# Patient Record
Sex: Female | Born: 1945
Health system: Southern US, Community
[De-identification: ages and names within clinical notes are randomized; demographics above are authoritative.]

## PROBLEM LIST (undated history)

## (undated) DIAGNOSIS — Z9889 Other specified postprocedural states: Secondary | ICD-10-CM

## (undated) DIAGNOSIS — R51 Headache: Secondary | ICD-10-CM

## (undated) DIAGNOSIS — K589 Irritable bowel syndrome without diarrhea: Secondary | ICD-10-CM

## (undated) DIAGNOSIS — F419 Anxiety disorder, unspecified: Secondary | ICD-10-CM

## (undated) DIAGNOSIS — C439 Malignant melanoma of skin, unspecified: Secondary | ICD-10-CM

## (undated) DIAGNOSIS — I2510 Atherosclerotic heart disease of native coronary artery without angina pectoris: Secondary | ICD-10-CM

## (undated) DIAGNOSIS — M8580 Other specified disorders of bone density and structure, unspecified site: Secondary | ICD-10-CM

## (undated) DIAGNOSIS — K219 Gastro-esophageal reflux disease without esophagitis: Secondary | ICD-10-CM

## (undated) DIAGNOSIS — J189 Pneumonia, unspecified organism: Secondary | ICD-10-CM

## (undated) DIAGNOSIS — R7303 Prediabetes: Secondary | ICD-10-CM

## (undated) DIAGNOSIS — Z8719 Personal history of other diseases of the digestive system: Secondary | ICD-10-CM

## (undated) DIAGNOSIS — C801 Malignant (primary) neoplasm, unspecified: Secondary | ICD-10-CM

## (undated) DIAGNOSIS — F329 Major depressive disorder, single episode, unspecified: Secondary | ICD-10-CM

## (undated) DIAGNOSIS — G4730 Sleep apnea, unspecified: Secondary | ICD-10-CM

## (undated) DIAGNOSIS — T8859XA Other complications of anesthesia, initial encounter: Secondary | ICD-10-CM

## (undated) DIAGNOSIS — R112 Nausea with vomiting, unspecified: Secondary | ICD-10-CM

## (undated) DIAGNOSIS — F32A Depression, unspecified: Secondary | ICD-10-CM

## (undated) DIAGNOSIS — M797 Fibromyalgia: Secondary | ICD-10-CM

## (undated) DIAGNOSIS — I10 Essential (primary) hypertension: Secondary | ICD-10-CM

## (undated) DIAGNOSIS — T4145XA Adverse effect of unspecified anesthetic, initial encounter: Secondary | ICD-10-CM

## (undated) DIAGNOSIS — M1990 Unspecified osteoarthritis, unspecified site: Secondary | ICD-10-CM

## (undated) DIAGNOSIS — R350 Frequency of micturition: Secondary | ICD-10-CM

## (undated) HISTORY — PX: HERNIA REPAIR: SHX51

## (undated) HISTORY — PX: ARCUATE KERATECTOMY: SHX212

## (undated) HISTORY — PX: APPENDECTOMY: SHX54

---

## 1968-02-28 HISTORY — PX: OTHER SURGICAL HISTORY: SHX169

## 1981-02-27 HISTORY — PX: CERVICAL FUSION: SHX112

## 1989-02-27 HISTORY — PX: BREAST LUMPECTOMY: SHX2

## 1999-02-28 HISTORY — PX: OTHER SURGICAL HISTORY: SHX169

## 1999-05-17 ENCOUNTER — Encounter: Admission: RE | Admit: 1999-05-17 | Discharge: 1999-05-17 | Payer: Self-pay | Admitting: Family Medicine

## 1999-05-17 ENCOUNTER — Encounter: Payer: Self-pay | Admitting: Family Medicine

## 1999-06-16 ENCOUNTER — Other Ambulatory Visit: Admission: RE | Admit: 1999-06-16 | Discharge: 1999-06-16 | Payer: Self-pay | Admitting: Family Medicine

## 2000-06-25 ENCOUNTER — Other Ambulatory Visit: Admission: RE | Admit: 2000-06-25 | Discharge: 2000-06-25 | Payer: Self-pay | Admitting: Family Medicine

## 2001-05-23 ENCOUNTER — Encounter: Payer: Self-pay | Admitting: *Deleted

## 2001-05-23 ENCOUNTER — Encounter: Admission: RE | Admit: 2001-05-23 | Discharge: 2001-05-23 | Payer: Self-pay | Admitting: *Deleted

## 2001-06-28 ENCOUNTER — Emergency Department (HOSPITAL_COMMUNITY): Admission: EM | Admit: 2001-06-28 | Discharge: 2001-06-28 | Payer: Self-pay | Admitting: Emergency Medicine

## 2001-06-28 ENCOUNTER — Encounter: Payer: Self-pay | Admitting: Emergency Medicine

## 2001-07-03 ENCOUNTER — Other Ambulatory Visit: Admission: RE | Admit: 2001-07-03 | Discharge: 2001-07-03 | Payer: Self-pay | Admitting: *Deleted

## 2002-05-28 ENCOUNTER — Encounter: Payer: Self-pay | Admitting: Family Medicine

## 2002-05-28 ENCOUNTER — Encounter: Admission: RE | Admit: 2002-05-28 | Discharge: 2002-05-28 | Payer: Self-pay | Admitting: Family Medicine

## 2002-11-20 ENCOUNTER — Encounter: Admission: RE | Admit: 2002-11-20 | Discharge: 2002-11-20 | Payer: Self-pay

## 2003-02-28 HISTORY — PX: OTHER SURGICAL HISTORY: SHX169

## 2003-07-03 ENCOUNTER — Encounter: Admission: RE | Admit: 2003-07-03 | Discharge: 2003-07-03 | Payer: Self-pay | Admitting: Family Medicine

## 2003-09-16 ENCOUNTER — Other Ambulatory Visit: Admission: RE | Admit: 2003-09-16 | Discharge: 2003-09-16 | Payer: Self-pay | Admitting: Family Medicine

## 2003-10-20 ENCOUNTER — Encounter: Admission: RE | Admit: 2003-10-20 | Discharge: 2003-10-20 | Payer: Self-pay | Admitting: Family Medicine

## 2003-11-04 ENCOUNTER — Emergency Department (HOSPITAL_COMMUNITY): Admission: EM | Admit: 2003-11-04 | Discharge: 2003-11-04 | Payer: Self-pay | Admitting: Emergency Medicine

## 2003-11-09 ENCOUNTER — Ambulatory Visit (HOSPITAL_COMMUNITY): Admission: RE | Admit: 2003-11-09 | Discharge: 2003-11-11 | Payer: Self-pay | Admitting: Orthopedic Surgery

## 2004-03-24 ENCOUNTER — Ambulatory Visit: Payer: Self-pay | Admitting: Internal Medicine

## 2004-03-24 ENCOUNTER — Ambulatory Visit (HOSPITAL_BASED_OUTPATIENT_CLINIC_OR_DEPARTMENT_OTHER): Admission: RE | Admit: 2004-03-24 | Discharge: 2004-03-24 | Payer: Self-pay | Admitting: Family Medicine

## 2004-09-20 ENCOUNTER — Encounter: Admission: RE | Admit: 2004-09-20 | Discharge: 2004-09-20 | Payer: Self-pay | Admitting: Family Medicine

## 2004-12-12 ENCOUNTER — Encounter: Admission: RE | Admit: 2004-12-12 | Discharge: 2004-12-12 | Payer: Self-pay | Admitting: Orthopedic Surgery

## 2004-12-16 ENCOUNTER — Encounter: Admission: RE | Admit: 2004-12-16 | Discharge: 2004-12-16 | Payer: Self-pay | Admitting: Orthopedic Surgery

## 2005-10-25 ENCOUNTER — Encounter: Admission: RE | Admit: 2005-10-25 | Discharge: 2005-10-25 | Payer: Self-pay | Admitting: Family Medicine

## 2005-11-02 ENCOUNTER — Encounter: Admission: RE | Admit: 2005-11-02 | Discharge: 2005-11-02 | Payer: Self-pay | Admitting: Family Medicine

## 2005-12-29 ENCOUNTER — Other Ambulatory Visit: Admission: RE | Admit: 2005-12-29 | Discharge: 2005-12-29 | Payer: Self-pay | Admitting: Family Medicine

## 2007-05-15 ENCOUNTER — Encounter: Admission: RE | Admit: 2007-05-15 | Discharge: 2007-05-15 | Payer: Self-pay | Admitting: Family Medicine

## 2008-02-28 HISTORY — PX: OTHER SURGICAL HISTORY: SHX169

## 2008-04-17 ENCOUNTER — Other Ambulatory Visit: Admission: RE | Admit: 2008-04-17 | Discharge: 2008-04-17 | Payer: Self-pay | Admitting: Family Medicine

## 2008-07-03 ENCOUNTER — Encounter: Admission: RE | Admit: 2008-07-03 | Discharge: 2008-07-03 | Payer: Self-pay | Admitting: Family Medicine

## 2008-07-06 ENCOUNTER — Encounter: Admission: RE | Admit: 2008-07-06 | Discharge: 2008-07-06 | Payer: Self-pay | Admitting: Gastroenterology

## 2008-10-20 ENCOUNTER — Inpatient Hospital Stay (HOSPITAL_COMMUNITY): Admission: RE | Admit: 2008-10-20 | Discharge: 2008-10-22 | Payer: Self-pay | Admitting: Surgery

## 2009-01-11 ENCOUNTER — Other Ambulatory Visit: Admission: RE | Admit: 2009-01-11 | Discharge: 2009-01-11 | Payer: Self-pay | Admitting: Family Medicine

## 2009-01-13 ENCOUNTER — Ambulatory Visit (HOSPITAL_COMMUNITY): Admission: RE | Admit: 2009-01-13 | Discharge: 2009-01-13 | Payer: Self-pay | Admitting: Family Medicine

## 2009-08-09 ENCOUNTER — Other Ambulatory Visit: Admission: RE | Admit: 2009-08-09 | Discharge: 2009-08-09 | Payer: Self-pay | Admitting: Family Medicine

## 2009-11-09 ENCOUNTER — Encounter: Admission: RE | Admit: 2009-11-09 | Discharge: 2009-11-09 | Payer: Self-pay | Admitting: Orthopaedic Surgery

## 2010-01-31 ENCOUNTER — Other Ambulatory Visit
Admission: RE | Admit: 2010-01-31 | Discharge: 2010-01-31 | Payer: Self-pay | Source: Home / Self Care | Admitting: Family Medicine

## 2010-04-13 ENCOUNTER — Other Ambulatory Visit: Payer: Self-pay | Admitting: Family Medicine

## 2010-04-14 ENCOUNTER — Other Ambulatory Visit: Payer: Self-pay | Admitting: Family Medicine

## 2010-04-14 ENCOUNTER — Other Ambulatory Visit: Payer: Self-pay | Admitting: Dermatology

## 2010-04-14 DIAGNOSIS — N63 Unspecified lump in unspecified breast: Secondary | ICD-10-CM

## 2010-04-14 DIAGNOSIS — N644 Mastodynia: Secondary | ICD-10-CM

## 2010-04-21 ENCOUNTER — Ambulatory Visit
Admission: RE | Admit: 2010-04-21 | Discharge: 2010-04-21 | Disposition: A | Discharge status: Home / Self Care | Payer: Medicare HMO | Source: Ambulatory Visit | Attending: Family Medicine | Admitting: Family Medicine

## 2010-04-21 DIAGNOSIS — N63 Unspecified lump in unspecified breast: Secondary | ICD-10-CM

## 2010-04-21 DIAGNOSIS — N644 Mastodynia: Secondary | ICD-10-CM

## 2010-06-04 LAB — CBC
HCT: 36.1 % (ref 36.0–46.0)
HCT: 36.5 % (ref 36.0–46.0)
Hemoglobin: 12.2 g/dL (ref 12.0–15.0)
Hemoglobin: 12.4 g/dL (ref 12.0–15.0)
MCHC: 33.7 g/dL (ref 30.0–36.0)
MCHC: 34 g/dL (ref 30.0–36.0)
MCV: 85.6 fL (ref 78.0–100.0)
MCV: 85.6 fL (ref 78.0–100.0)
Platelets: 214 10*3/uL (ref 150–400)
Platelets: 219 10*3/uL (ref 150–400)
RBC: 4.22 MIL/uL (ref 3.87–5.11)
RBC: 4.27 MIL/uL (ref 3.87–5.11)
RDW: 13 % (ref 11.5–15.5)
RDW: 13 % (ref 11.5–15.5)
WBC: 13.1 10*3/uL — ABNORMAL HIGH (ref 4.0–10.5)
WBC: 18.7 10*3/uL — ABNORMAL HIGH (ref 4.0–10.5)

## 2010-06-04 LAB — DIFFERENTIAL
Basophils Absolute: 0 10*3/uL (ref 0.0–0.1)
Basophils Absolute: 0.1 10*3/uL (ref 0.0–0.1)
Basophils Relative: 0 % (ref 0–1)
Basophils Relative: 1 % (ref 0–1)
Eosinophils Absolute: 0 10*3/uL (ref 0.0–0.7)
Eosinophils Absolute: 0 10*3/uL (ref 0.0–0.7)
Eosinophils Relative: 0 % (ref 0–5)
Eosinophils Relative: 0 % (ref 0–5)
Lymphocytes Relative: 15 % (ref 12–46)
Lymphocytes Relative: 25 % (ref 12–46)
Lymphs Abs: 2.8 10*3/uL (ref 0.7–4.0)
Lymphs Abs: 3.3 10*3/uL (ref 0.7–4.0)
Monocytes Absolute: 0.9 10*3/uL (ref 0.1–1.0)
Monocytes Absolute: 1.2 10*3/uL — ABNORMAL HIGH (ref 0.1–1.0)
Monocytes Relative: 6 % (ref 3–12)
Monocytes Relative: 7 % (ref 3–12)
Neutro Abs: 14.7 10*3/uL — ABNORMAL HIGH (ref 1.7–7.7)
Neutro Abs: 8.8 10*3/uL — ABNORMAL HIGH (ref 1.7–7.7)
Neutrophils Relative %: 67 % (ref 43–77)
Neutrophils Relative %: 79 % — ABNORMAL HIGH (ref 43–77)

## 2010-06-04 LAB — HEMOGLOBIN AND HEMATOCRIT, BLOOD
HCT: 39.6 % (ref 36.0–46.0)
Hemoglobin: 13.4 g/dL (ref 12.0–15.0)

## 2010-06-04 LAB — BASIC METABOLIC PANEL
BUN: 11 mg/dL (ref 6–23)
CO2: 30 mEq/L (ref 19–32)
Calcium: 9.3 mg/dL (ref 8.4–10.5)
Chloride: 106 mEq/L (ref 96–112)
Creatinine, Ser: 0.9 mg/dL (ref 0.4–1.2)
GFR calc Af Amer: >60 mL/min (ref >60)
GFR calc non Af Amer: >60 mL/min (ref >60)
Glucose, Bld: 107 mg/dL — ABNORMAL HIGH (ref 70–99)
Potassium: 4.4 mEq/L (ref 3.5–5.1)
Sodium: 140 mEq/L (ref 135–145)

## 2010-07-12 NOTE — Op Note (Signed)
Bonnie Juarez                  ACCOUNT NO.:  [id number]   MEDICAL RECORD NO.:  [id number]          PATIENT TYPE:  INP   LOCATION:  0003                         FACILITY:  [facility]   PHYSICIAN:  Thornton Park. Daphine Deutscher, MD  DATE OF BIRTH:  12/06/1945   DATE OF PROCEDURE:  10/20/2008  DATE OF DISCHARGE:                               OPERATIVE REPORT   PREOPERATIVE DIAGNOSIS:  Gastroesophageal reflux disease with a  prominent hiatal hernia on upper GI and symptomatically consistent with  reflux, improved with omeprazole   POSTOPERATIVE DIAGNOSIS:  Gastroesophageal reflux disease with a  prominent hiatal hernia on upper GI and symptomatically consistent with  reflux, improved with omeprazole.   PROCEDURES:  Laparoscopic repair of hiatal hernia (three suture  pledgeted closure posteriorly) and laparoscopic Nissen fundoplication  (performed over #56 lighted bougie with three suture wrap secured with  tie knots)   SURGEON:  Thornton Park. Daphine Deutscher, MD   ASSISTANT:  Adolph Pollack, M.D.   ANESTHESIA:  General endotracheal.   DESCRIPTION OF PROCEDURE:  Bonnie Juarez is a 65 year old lady taken to  room #11 on Tuesday, October 20, 2008.  The abdomen was prepped with  Techni-Care equivalent and draped sterilely.  Access to the abdomen was  gained through the left upper quadrant using a 0 degrees 10/11 without  difficulty and the abdomen was insufflated.  Standard trocar placements  were used for a Nissen, ultimately with another #5 on the left and two  #11s on the right, #11 to the left of the umbilicus and #5 in the upper  midline for the [facility] retractor.  With the [facility] in place, she  had a broad-based hiatal hernia with obvious mechanical component.  I  went ahead and used harmonic scalpel and took down the phrenoesophageal  reflection as well as the gastrohepatic window and dissected the right  crus up, and then carried it anteriorly across to the left side.   Next, I took down the  short gastrics with a harmonic scalpel and then  mobilized the hiatal hernia from the left side.  Eventually, I put a  Penrose drain beneath it, around it, and used traction and then  completely dissected the distal esophagus, getting plenty of length into  the abdomen.  The crura were also dissected completely so I could get a  good closure.   Repair was then affected with three pledgeted sutures posteriorly tied  with extracorporeal knots.  This made this nice and snug, but a 56  dilator easily went down through.  With the 56 in place, I brought the  cardia around, invaginated it around the distal esophagus and secured it  with three sutures and tie knots.  The third suture I went ahead and got  a purchase of the stomach, and then on the second portion of the stomach  that was being wrapped, before getting a bit of the esophagus, and the  wrapped stomach.  These were all held in place.  A good wrap was  present.  There was good length and there was no bleeding.  The bougie  was removed.  Everything looked good and the patient seemed to tolerate  it well.   The ports were all injected with 0.5% Marcaine with epinephrine and they  were closed with 4-0 Vicryl after the abdomen was deflated.  Vicryl 4-0  in the subcu and then Benzoin and Steri-Strips.  The patient was taken  to recovery room in satisfactory condition.      Thornton Park Daphine Deutscher, MD  Electronically Signed     MBM/MEDQ  D:  10/20/2008  T:  10/20/2008  Job:  [id number]   cc:   Everardo All. Madilyn Fireman, M.D.  Fax: [phone]   Pam Drown, M.D.  Fax: [phone]

## 2010-07-15 NOTE — Op Note (Signed)
NAME:  Bonnie Juarez, Bonnie Juarez                            ACCOUNT NO.:  [id number]   MEDICAL RECORD NO.:  [id number]                   PATIENT TYPE:  OIB   LOCATION:  5001                                 FACILITY:  [facility]   PHYSICIAN:  Burnard Bunting, M.D.                 DATE OF BIRTH:  11/24/1945   DATE OF PROCEDURE:  11/09/2003  DATE OF DISCHARGE:                                 OPERATIVE REPORT   PREOPERATIVE DIAGNOSIS:  Right radial head fracture with elbow subluxation.   POSTOPERATIVE DIAGNOSIS:  Right radial head fracture with elbow subluxation.   OPERATION PERFORMED:  Right radial head replacement.   SURGEON:  Burnard Bunting, M.D.   ASSISTANT:  Vanita Panda. Magnus Ivan, M.D.   ESTIMATED BLOOD LOSS:  25 mL.   DRAINS:  None.   TOURNIQUET TIME:  44 minutes at 250 mmHg.   IMPLANT SIZE:  Acumed radial head replacement long stem, 20 mm diameter,  size 2 stem.   ANESTHESIA:  General endotracheal.   DESCRIPTION OF PROCEDURE:  The patient was brought to the operating room  where general endotracheal anesthesia was induced.  Preoperative intravenous  antibiotics were administered.  The right arm was prepped with DuraPrep  solution and draped in sterile manner, Ioban was used to cover the operative  field.  The patient was noted to have medial and posterior ecchymosis.  The  arm was elevated and exsanguinated with the Esmarch wrap.  Tourniquet was  inflated.  An oblique incision over the radial head was made.  Skin and  subcutaneous tissue were sharply divided.  The interval between the anconeus  and extensor carpi ulnaris tendon was identified.  This was developed with a  Cobb elevator.  The capsule and annular ligament were then incised in  longitudinal fashion.  This was tagged with two sutures and retracted.  The  radial head fragment was highly comminuted and unstable and was removed.  It  sized to a size 20 mm.  Using a careful subperiosteal dissection, the radial  neck 1 cm distal  to the fracture was exposed.  Using the standard template  with pronation and supination, the anticipated location of the saw cut was  made on the remaining radial neck.  The canal was then broached up to size 1  stem.  The standard size 1 was placed and was found to be highly unstable.  A radiographic examination demonstrated instability with pronation and  supination.  At this time longer stem trial was chosen with a size 1 stem  and it was found to be more stable with pronation, supination, flexion and  extension.  Radiographically, the canal was well filled even though her bone  was osteoporotic.  The radius was lengthened at most a millimeter.  At this  time the trial implant was removed and it was noted that a size 2 broach fit  easily within the  canal.  For that reason, a long stem, size 20 mm and size  2 implant was chosen.  This was placed and impacted. The patient went  through a range of motion and was found to be stable.  The patient did have  some opening medially with the standard implant in position which was  unstable but had less than a millimeter opening to valgus stress with the  longer stem implant in place.  At this time tourniquet was released.  The  joint was thoroughly irrigated and no debris was located.  The capsule was  then closed with  interrupted #1 Vicryl suture.  The ECU anconeus interval was then closed  using a #1 Vicryl suture.  Skin was closed using interrupted inverted 2-0  Vicryl suture and a running 3-0 pullout Prolene.  The patient was placed in  a bulky posterior splint.  The patient tolerated the procedure well without  immediate complications.                                               Burnard Bunting, M.D.    GSD/MEDQ  D:  11/10/2003  T:  11/10/2003  Job:  [id number]

## 2010-07-15 NOTE — Procedures (Signed)
NAME:  Bonnie Juarez, Bonnie Juarez                  ACCOUNT NO.:  [id number]   MEDICAL RECORD NO.:  [id number]          PATIENT TYPE:  OUT   LOCATION:  SLEEP CENTER                 FACILITY[facility]  [facility]   PHYSICIAN:  Clinton D. Maple Hudson, M.D. DATE OF BIRTH:  [birth year]   DATE OF STUDY:  03/24/2004                              NOCTURNAL POLYSOMNOGRAM   REFERRING PHYSICIAN:  Gweneth Dimitri, MD   INDICATIONS FOR STUDY:  Insomnia with sleep apnea. Epworth sleepiness score  4/24, BMI 29.9, weight 198 pounds.   SLEEP ARCHITECTURE:  Total sleep time 366 minutes with sleep efficiency of  79%. Stage I was 9%, stage II was 80%, stages III and IV were absent, REM  was 11% of total sleep time. Latency to sleep onset 13 minutes. Latency to  REM 360 minutes. Awake after sleep onset 90 minutes. Arousal index 12.5.   RESPIRATORY DATA:  Split study protocol. RDI 10.3 per hour indicating mild  obstructive sleep apnea/hypopnea syndrome before CPAP. This included 4  obstructive apneas and 24 hypopneas before CPAP. Events were not positional.  REM RDI 1.5 per hour. CPAP was titrated to 12 CWP, RDI 1.1 per hour. The  technician had to work through several styles to find one acceptable to the  patient and finally ended up using a Respironics Swift with small nasal  pillows and heated humidifier. The patient did not like head gear or  anything touching the face and is uncomfortable with any pressure over the  upper teeth.   OXYGEN DATA:  Moderate to loud snoring with oxygen desaturation to a nadir  of 83% before CPAP. After CPAP control, oxygen saturation held 96% to 98% on  room air.   CARDIAC DATA:  Normal sinus rhythm.   MOVEMENT/PARASOMNIA:  Insignificant. The patient expressed concern about  restless leg syndrome, but no movement activity was noted on meeting  criteria for this diagnosis.   IMPRESSION/RECOMMENDATIONS:  1.  Mild obstructive sleep apnea/hypopnea syndrome, RDI 10.3 per hour with      oxygen desaturation to  83%.  2.  CPAP titration to 12 CWP, RDI 1.1 per hour, settling on a Respironics      Swift device with small nasal pillows, using heated humidifier.     CDY/MEDQ  D:  04/03/2004 10:48:18  T:  04/03/2004 04:54:09  Job:  [id number]

## 2010-07-15 NOTE — Consult Note (Signed)
NAME:  Bonnie Juarez, Bonnie Juarez                            ACCOUNT NO.:  [id number]   MEDICAL RECORD NO.:  [id number]                   PATIENT TYPE:  OIB   LOCATION:  5001                                 FACILITY:  [facility]   PHYSICIAN:  Lonia Blood, M.D.            DATE OF BIRTH:  1946-01-24   DATE OF CONSULTATION:  11/09/2003  DATE OF DISCHARGE:                                   CONSULTATION   CONSULTING PHYSICIAN:  Dr. Burnard Bunting, orthopedics.   REASON FOR CONSULTATION:  Iatrogenic decreased level of consciousness  secondary to oversedation.   HISTORY OF PRESENT ILLNESS:  Bonnie Juarez is a 65 year old female with a history  of right radial head fracture on November 04, 2003, status post a fall, who  is status post right radial head replacement postoperative day 0.  This  evening, the patient received 29 mg of morphine sulfate and then Phenergan  over a short-time course.  The patient developed decreased level of  consciousness and hypoxia that responded to two ampules of Narcan.  There is  no evidence of aspiration.  The patient denies symptoms of cough or chest  pain.  She states she is currently feeling better and denies any discomfort,  although she does feel sleepy.  Her husband remains at her bedside.   PAST MEDICAL HISTORY:  1.  Hypertension.  2.  Gastroesophageal reflux disease.  3.  Hypercholesterolemia for fibromyalgia.  4.  Seasonal allergies.   MEDICATIONS:  1.  Hydrochlorothiazide 25 mg daily.  2.  Nexium 40 mg daily.  3.  Lipitor 40 mg each night.  4.  Allegra 180 mg as needed daily.  5.  Nasonex one spray per nostril daily as needed.  6.  Ativan 0.5 mg as needed.  7.  Ibuprofen 400 mg every eight hours as needed.  8.  Robaxin 500 mg.  9.  Toradol 10 mg daily.  10. Percocet 5/325 mg daily.  11. Maxidone 750 mg q.8h.  12. Lunesta 3 mg as needed.  13. Zoloft 150 mg each day.   ALLERGIES:  No known drug allergies.   SOCIAL HISTORY:  The patient lives with her  husband.  She is employed full  time.  No tobacco use.  She has a daily glass of wine.   REVIEW OF SYSTEMS:  [name] left knee is bruised and sore due to the  same fall.  No decreased range of motion.  Otherwise negative.   PHYSICAL EXAMINATION:  GENERAL:  The patient is sleepy but arousable.  VITAL SIGNS:  Temperature 97.2, heart rate 67, blood pressure 141/81.  Oxygen saturation 98% on two liters per minute nasal cannula.  HEENT:  Normocephalic, atraumatic.  Mucosal membranes are pink and moist.  Oropharynx is clear.  NECK:  No thyromegaly or masses.  LYMPHATICS:  No palpable anterior or cervical occipital or inguinal  lymphadenopathy appreciated.  CARDIOVASCULAR:  In precordium, S1,  S2, regular rate and rhythm, no ectopy,  a 2/2 peripheral pulses in all four extremities.  Lungs fair exchange, clear  to auscultation bilaterally without crackle, wheeze or rhonchi.  ABDOMEN:  Nondistended with normoactive bowel sounds, soft and nontender.  No hepatosplenomegaly or masses.  EXTREMITIES:  Right upper extremity is an immobilizer.  The patient is able  to move all four fingers and thumb.  No pedal edema is noted.  NEUROLOGIC:  Cranial nerves II-XII are intact.  Sensation is intact to light  touch.  Mentation is also intact.  The patient is oriented, sleepy but  arousable.   LABORATORY DATA:  Sodium 136, potassium 4, chloride 104, bicarbonate 26, BUN  19, creatinine 1, glucose 109.  Calcium 9.  Total bilirubin 0.7.  Alkaline  phosphatase 54.  AST 18.  ALT 21.  Total protein 6.2.  Albumin 3.7.  White  blood cells 7.5, hemoglobin 12.5, platelets 275.   ASSESSMENT/PLAN:  A 66 year old female status post right radial head  replacement, postoperative day 0, status post iatrogenic oversedation who is  currently stabilizing.  1.  Oversedation.  Responded well to Narcan, increasing level of      consciousness.  Continue pulse oximetry  monitoring overnight, wean      oxygen.  We would  recommend no additional narcotics this evening.  There      is no evidence of an acute CVA as a potential etiology.  By history, the      patient does not have any symptoms of cardiac strain as a result of the      incident.  2.  Status post right radial head replacement.  Therapy per orthopedics.      Agree with Percocet as needed for discomfort in the morning.  3.  Hypertension.  Resume hydrochlorothiazide in the morning.  4.  Gastroesophageal reflux disease, resume Nexium in the morning.  5.  Hypercholesterolemia.  Resume Lipitor in the morning. LFTs are stable on      statin.  6.  Fibromyalgia.  Resume Zoloft, ibuprofen and Ativan per home regimen.  7.  Seasonal allergies, not currently an issue.  Continue Allegra and      Nasonex as needed.   DISPOSITION:  Per orthopedic surgery, anticipate full recovery from sedation  in the morning.   Thank you for this consultation.     Barney Drain, M.D.                       Lonia Blood, M.D.    SS/MEDQ  D:  11/09/2003  T:  11/09/2003  Job:  [id number]   cc:   Lonia Blood, M.D.  [address]  [place], [place] [address]  Fax: [phone]   G. Dorene Grebe, M.D.  Fax: [phone]

## 2010-08-02 ENCOUNTER — Other Ambulatory Visit: Payer: Self-pay | Admitting: Family Medicine

## 2010-08-02 ENCOUNTER — Other Ambulatory Visit (HOSPITAL_COMMUNITY)
Admission: RE | Admit: 2010-08-02 | Discharge: 2010-08-02 | Disposition: A | Discharge status: Home / Self Care | Payer: Medicare HMO | Source: Ambulatory Visit | Attending: Family Medicine | Admitting: Family Medicine

## 2010-08-02 DIAGNOSIS — R87612 Low grade squamous intraepithelial lesion on cytologic smear of cervix (LGSIL): Secondary | ICD-10-CM | POA: Insufficient documentation

## 2011-02-02 ENCOUNTER — Other Ambulatory Visit (HOSPITAL_COMMUNITY)
Admission: RE | Admit: 2011-02-02 | Discharge: 2011-02-02 | Disposition: A | Discharge status: Home / Self Care | Payer: Medicare HMO | Source: Ambulatory Visit | Attending: Family Medicine | Admitting: Family Medicine

## 2011-02-02 ENCOUNTER — Other Ambulatory Visit: Payer: Self-pay | Admitting: Family Medicine

## 2011-02-02 DIAGNOSIS — R87612 Low grade squamous intraepithelial lesion on cytologic smear of cervix (LGSIL): Secondary | ICD-10-CM | POA: Insufficient documentation

## 2011-04-12 ENCOUNTER — Other Ambulatory Visit: Payer: Self-pay | Admitting: Family Medicine

## 2011-04-12 DIAGNOSIS — Z1231 Encounter for screening mammogram for malignant neoplasm of breast: Secondary | ICD-10-CM

## 2011-04-18 ENCOUNTER — Other Ambulatory Visit: Payer: Self-pay | Admitting: Dermatology

## 2011-04-25 ENCOUNTER — Ambulatory Visit: Payer: Medicare HMO

## 2011-05-02 ENCOUNTER — Ambulatory Visit
Admission: RE | Admit: 2011-05-02 | Discharge: 2011-05-02 | Disposition: A | Discharge status: Home / Self Care | Payer: Medicare Other | Source: Ambulatory Visit | Attending: Family Medicine | Admitting: Family Medicine

## 2011-05-02 DIAGNOSIS — Z1231 Encounter for screening mammogram for malignant neoplasm of breast: Secondary | ICD-10-CM

## 2011-08-03 ENCOUNTER — Other Ambulatory Visit: Payer: Self-pay | Admitting: Family Medicine

## 2011-08-03 ENCOUNTER — Other Ambulatory Visit (HOSPITAL_COMMUNITY)
Admission: RE | Admit: 2011-08-03 | Discharge: 2011-08-03 | Disposition: A | Discharge status: Home / Self Care | Payer: Medicare Other | Source: Ambulatory Visit | Attending: Family Medicine | Admitting: Family Medicine

## 2011-08-03 DIAGNOSIS — R87612 Low grade squamous intraepithelial lesion on cytologic smear of cervix (LGSIL): Secondary | ICD-10-CM | POA: Insufficient documentation

## 2012-02-06 ENCOUNTER — Other Ambulatory Visit (HOSPITAL_COMMUNITY)
Admission: RE | Admit: 2012-02-06 | Discharge: 2012-02-06 | Disposition: A | Discharge status: Home / Self Care | Payer: Medicare Other | Source: Ambulatory Visit | Attending: Family Medicine | Admitting: Family Medicine

## 2012-02-06 ENCOUNTER — Other Ambulatory Visit: Payer: Self-pay | Admitting: Family Medicine

## 2012-02-06 DIAGNOSIS — Z124 Encounter for screening for malignant neoplasm of cervix: Secondary | ICD-10-CM | POA: Insufficient documentation

## 2012-02-28 HISTORY — PX: FRACTURE SURGERY: SHX138

## 2012-04-02 ENCOUNTER — Other Ambulatory Visit: Payer: Self-pay | Admitting: Family Medicine

## 2012-04-02 DIAGNOSIS — Z1231 Encounter for screening mammogram for malignant neoplasm of breast: Secondary | ICD-10-CM

## 2012-04-18 ENCOUNTER — Other Ambulatory Visit: Payer: Self-pay | Admitting: Dermatology

## 2012-05-02 ENCOUNTER — Ambulatory Visit
Admission: RE | Admit: 2012-05-02 | Discharge: 2012-05-02 | Disposition: A | Discharge status: Home / Self Care | Payer: Medicare Other | Source: Ambulatory Visit | Attending: Family Medicine | Admitting: Family Medicine

## 2012-05-02 DIAGNOSIS — Z1231 Encounter for screening mammogram for malignant neoplasm of breast: Secondary | ICD-10-CM

## 2012-08-08 ENCOUNTER — Other Ambulatory Visit (HOSPITAL_COMMUNITY)
Admission: RE | Admit: 2012-08-08 | Discharge: 2012-08-08 | Disposition: A | Discharge status: Home / Self Care | Payer: Medicare Other | Source: Ambulatory Visit | Attending: Family Medicine | Admitting: Family Medicine

## 2012-08-08 ENCOUNTER — Other Ambulatory Visit: Payer: Self-pay | Admitting: Family Medicine

## 2012-08-08 DIAGNOSIS — R87612 Low grade squamous intraepithelial lesion on cytologic smear of cervix (LGSIL): Secondary | ICD-10-CM | POA: Insufficient documentation

## 2012-08-08 DIAGNOSIS — Z1151 Encounter for screening for human papillomavirus (HPV): Secondary | ICD-10-CM | POA: Insufficient documentation

## 2012-09-04 ENCOUNTER — Emergency Department (HOSPITAL_COMMUNITY)
Admission: EM | Admit: 2012-09-04 | Discharge: 2012-09-04 | Disposition: A | Discharge status: Home / Self Care | Payer: Medicare Other | Attending: Emergency Medicine | Admitting: Emergency Medicine

## 2012-09-04 ENCOUNTER — Encounter (HOSPITAL_COMMUNITY): Payer: Self-pay | Admitting: Emergency Medicine

## 2012-09-04 ENCOUNTER — Emergency Department (HOSPITAL_COMMUNITY): Payer: Medicare Other

## 2012-09-04 DIAGNOSIS — IMO0001 Reserved for inherently not codable concepts without codable children: Secondary | ICD-10-CM

## 2012-09-04 DIAGNOSIS — K3189 Other diseases of stomach and duodenum: Secondary | ICD-10-CM | POA: Insufficient documentation

## 2012-09-04 DIAGNOSIS — I10 Essential (primary) hypertension: Secondary | ICD-10-CM | POA: Insufficient documentation

## 2012-09-04 DIAGNOSIS — R1013 Epigastric pain: Secondary | ICD-10-CM

## 2012-09-04 DIAGNOSIS — Z87891 Personal history of nicotine dependence: Secondary | ICD-10-CM | POA: Insufficient documentation

## 2012-09-04 DIAGNOSIS — Z8719 Personal history of other diseases of the digestive system: Secondary | ICD-10-CM | POA: Insufficient documentation

## 2012-09-04 HISTORY — DX: Essential (primary) hypertension: I10

## 2012-09-04 LAB — CBC WITH DIFFERENTIAL/PLATELET
Basophils Absolute: 0 10*3/uL (ref 0.0–0.1)
Basophils Relative: 0 % (ref 0–1)
Eosinophils Absolute: 0.3 10*3/uL (ref 0.0–0.7)
Eosinophils Relative: 3 % (ref 0–5)
HCT: 38.6 % (ref 36.0–46.0)
Hemoglobin: 13 g/dL (ref 12.0–15.0)
Lymphocytes Relative: 54 % — ABNORMAL HIGH (ref 12–46)
Lymphs Abs: 5.3 10*3/uL — ABNORMAL HIGH (ref 0.7–4.0)
MCH: 28.7 pg (ref 26.0–34.0)
MCHC: 33.7 g/dL (ref 30.0–36.0)
MCV: 85.2 fL (ref 78.0–100.0)
Monocytes Absolute: 0.7 10*3/uL (ref 0.1–1.0)
Monocytes Relative: 7 % (ref 3–12)
Neutro Abs: 3.6 10*3/uL (ref 1.7–7.7)
Neutrophils Relative %: 36 % — ABNORMAL LOW (ref 43–77)
Platelets: 269 10*3/uL (ref 150–400)
RBC: 4.53 MIL/uL (ref 3.87–5.11)
RDW: 13.3 % (ref 11.5–15.5)
WBC: 9.8 10*3/uL (ref 4.0–10.5)

## 2012-09-04 LAB — POCT I-STAT TROPONIN I
Troponin i, poc: 0 ng/mL (ref 0.00–0.08)
Troponin i, poc: 0 ng/mL (ref 0.00–0.08)
Troponin i, poc: 0 ng/mL (ref 0.00–0.08)

## 2012-09-04 LAB — BASIC METABOLIC PANEL
BUN: 19 mg/dL (ref 6–23)
CO2: 22 mEq/L (ref 19–32)
Calcium: 9.5 mg/dL (ref 8.4–10.5)
Chloride: 104 mEq/L (ref 96–112)
Creatinine, Ser: 0.78 mg/dL (ref 0.50–1.10)
GFR calc Af Amer: >90 mL/min (ref >90)
GFR calc non Af Amer: 84 mL/min — ABNORMAL LOW (ref >90)
Glucose, Bld: 94 mg/dL (ref 70–99)
Potassium: 3.6 mEq/L (ref 3.5–5.1)
Sodium: 138 mEq/L (ref 135–145)

## 2012-09-04 LAB — HEPATIC FUNCTION PANEL
ALT: 15 U/L (ref 0–35)
AST: 18 U/L (ref 0–37)
Albumin: 3.2 g/dL — ABNORMAL LOW (ref 3.5–5.2)
Alkaline Phosphatase: 56 U/L (ref 39–117)
Bilirubin, Direct: <0.1 mg/dL (ref 0.0–0.3)
Total Bilirubin: 0.3 mg/dL (ref 0.3–1.2)
Total Protein: 6.5 g/dL (ref 6.0–8.3)

## 2012-09-04 LAB — LIPASE, BLOOD: Lipase: 37 U/L (ref 11–59)

## 2012-09-04 MED ORDER — ACETAMINOPHEN 500 MG PO TABS
500.0000 mg | ORAL_TABLET | Freq: Once | ORAL | Status: AC
  Administered 2012-09-04: 500 mg via ORAL
  Filled 2012-09-04: qty 1

## 2012-09-04 MED ORDER — GI COCKTAIL ~~LOC~~
30.0000 mL | Freq: Once | ORAL | Status: AC
  Administered 2012-09-04: 30 mL via ORAL
  Filled 2012-09-04: qty 30

## 2012-09-04 MED ORDER — ONDANSETRON HCL 4 MG/2ML IJ SOLN
4.0000 mg | Freq: Once | INTRAMUSCULAR | Status: AC
  Administered 2012-09-04: 4 mg via INTRAVENOUS
  Filled 2012-09-04: qty 2

## 2012-09-04 MED ORDER — PREDNISONE 2.5 MG PO TABS
4.5000 mg | ORAL_TABLET | Freq: Once | ORAL | Status: AC
  Administered 2012-09-04: 4.5 mg via ORAL
  Filled 2012-09-04: qty 2

## 2012-09-04 MED ORDER — SODIUM CHLORIDE 0.9 % IV SOLN
Freq: Once | INTRAVENOUS | Status: AC
  Administered 2012-09-04: 07:00:00 via INTRAVENOUS

## 2012-09-04 MED ORDER — PANTOPRAZOLE SODIUM 40 MG IV SOLR
40.0000 mg | Freq: Once | INTRAVENOUS | Status: AC
  Administered 2012-09-04: 40 mg via INTRAVENOUS
  Filled 2012-09-04: qty 40

## 2012-09-04 NOTE — ED Notes (Signed)
Troponin resulted 0947 is an error, blood redrawn, awaiting new results.

## 2012-09-04 NOTE — ED Notes (Signed)
Pt presents to ED with c/o "burning sensation all over, especially in GI".  Pt reports that she started feeling"uneasy" and "kind-of-nauseous", "burning all over body", "weak in arms and legs"; pt states that she was on Prednisone for 2 years and was tapered off now on Prednisone 4.5 mg daily---- pt states that she feels it has something to do with this medication.

## 2012-09-04 NOTE — Progress Notes (Signed)
Pt confirms pcp is wendy mcneil EPIC updated  

## 2012-09-04 NOTE — ED Provider Notes (Signed)
History    CSN: [id number] Arrival date & time 09/04/12  1191  First MD Initiated Contact with Patient 09/04/12 [phone]     Chief Complaint  Patient presents with  . Abdominal Burning    (Consider location/radiation/quality/duration/timing/severity/associated sxs/prior Treatment) HPI  SHALAE BELMONTE is a 67 y.o. female with past medical history significant for hiatal hernia, recent fundoplication in 2010, complaining of burning sensation in the epigastrium and chest starting yesterday and worsening this morning at 5 AM. Patient states that she took 2 TUMS and burning sensation spread to the bilateral arms: left greater than right. Associated symptoms of dyspepsia with sensation of queasiness and feeling that she needs to burp. Patient denies any pain but she states that she is uncomfortable. Patient has a strong family history of ACS concerned that she is having a heart attack. She is being weaned from prednisone which she has been on for 2 years for polymyalgia rheumatica. Patient is also transitioning from Cymbalta to Zoloft for fibromyalgia. Patient denies chest pain, shortness of breath, fever, nausea vomiting, change in bowel or bladder habits.  RF: FH, HTN, HLD, Former smoker No prior stress test  PCP [facility] at [place]  Past Medical History  Diagnosis Date  . Hypertension    History reviewed. No pertinent past surgical history. History reviewed. No pertinent family history. History  Substance Use Topics  . Smoking status: Former Games developer  . Smokeless tobacco: Not on file  . Alcohol Use: Yes   OB History   Grav Para Term Preterm Abortions TAB SAB Ect Mult Living            1     Review of Systems  Constitutional:       Negative except as described in HPI  HENT:       Negative except as described in HPI  Respiratory:       Negative except as described in HPI  Cardiovascular:       Negative except as described in HPI  Gastrointestinal:       Negative except as described in  HPI  Genitourinary:       Negative except as described in HPI  Musculoskeletal:       Negative except as described in HPI  Skin:       Negative except as described in HPI  Neurological:       Negative except as described in HPI  All other systems reviewed and are negative.    Allergies  Review of patient's allergies indicates no known allergies.  Home Medications  No current outpatient prescriptions on file. BP 178/56  Pulse 67  Temp(Src) 98.4 F (36.9 C)  Resp 18  SpO2 97% Physical Exam  Nursing note and vitals reviewed. Constitutional: She is oriented to person, place, and time. She appears well-developed and well-nourished. No distress.  HENT:  Head: Normocephalic.  Mouth/Throat: Oropharynx is clear and moist.  Eyes: Conjunctivae and EOM are normal. Pupils are equal, round, and reactive to light.  Neck: Normal range of motion. No JVD present.  Cardiovascular: Normal rate, regular rhythm and intact distal pulses.   Pulmonary/Chest: Effort normal and breath sounds normal. No stridor. No respiratory distress. She has no wheezes. She has no rales. She exhibits no tenderness.  Abdominal: Soft. Bowel sounds are normal. She exhibits no distension and no mass. There is no tenderness. There is no rebound and no guarding.  Musculoskeletal: Normal range of motion. She exhibits no edema and no tenderness.  Neurological: She  is alert and oriented to person, place, and time. No cranial nerve deficit.  Skin: Skin is warm.  Psychiatric: She has a normal mood and affect.    ED Course  Procedures (including critical care time) Labs Reviewed  CBC WITH DIFFERENTIAL - Abnormal; Notable for the following:    Neutrophils Relative % 36 (*)    Lymphocytes Relative 54 (*)    Lymphs Abs 5.3 (*)    All other components within normal limits  BASIC METABOLIC PANEL  HEPATIC FUNCTION PANEL  LIPASE, BLOOD  POCT I-STAT TROPONIN I   Dg Chest 2 View  09/04/2012   *RADIOLOGY REPORT*  Clinical  Data: Mid chest discomfort.  Burning sensation. Nonsmoker.  CHEST - 2 VIEW  Comparison: 10/14/2008  Findings: Pulmonary hyperinflation and peribronchial thickening may be due to reactive airways disease or asthma.  Emphysema can also have this appearance.  Normal heart size and pulmonary vascularity. No focal airspace disease or consolidation in the lungs.  No blunting of costophrenic angles.  No pneumothorax.  Tortuous aorta. No significant change since previous study.  IMPRESSION: Hyperinflation with central interstitial changes suggest reactive airways disease or asthma.  Emphysema can also have this appearance.  No focal consolidation.   Original Report Authenticated By: Burman Nieves, M.D.    Date: 09/04/2012  Rate: 68  Rhythm: normal sinus rhythm  QRS Axis: normal  Intervals: normal  ST/T Wave abnormalities: normal  Conduction Disutrbances:none  Narrative Interpretation:   Old EKG Reviewed: unchanged  8:11 AM patient reexamined, results discussed. Patient reports complete resolution to dyspepsia and burning sensation. Plan is to obtain secondary troponin. Patient is agreeable. She reports discomfort to dorsum of left hand where there is a hematoma from a blown vein. Ice and Tylenol ordered.  1. Elevated blood pressure   2. Dyspepsia     MDM   Filed Vitals:   09/04/12 0709 09/04/12 0735  BP: 178/56 181/81  Pulse: 67 103  Temp: 98.4 F (36.9 C) 98.3 F (36.8 C)  Resp: 18 18  SpO2: 97% 99%     HAZELGRACE BONHAM is a 67 y.o. female with dyspepsia and burning sensation to bilateral arms. Patient is very concerned about ACS. Abdominal exam is benign, EKG is nonischemic, first troponin is negative and chest x-ray is also negative. CBC and electrolytes are all within normal limits.  Considering the patient's advanced age and risk factors I think it is reasonable to draw second troponin although her symptoms are quite atypical.  Troponin resulted at 9:47 AM was drawn off of first blood  draw of the day. New blood obtained and safety portal initiated by RN.   Second troponin is negative. Patient has an appointment with her primary care physician tomorrow. I have urged her to discuss stress testing.  Medications  ondansetron (ZOFRAN) injection 4 mg (not administered)  pantoprazole (PROTONIX) injection 40 mg (not administered)  gi cocktail (Maalox,Lidocaine,Donnatal) (not administered)    Pt is hemodynamically stable, appropriate for, and amenable to discharge at this time. Pt verbalized understanding and agrees with care plan. Outpatient follow-up and specific return precautions discussed.     Wynetta Emery, PA-C 09/04/12 1153

## 2012-09-04 NOTE — ED Provider Notes (Signed)
Medical screening examination/treatment/procedure(s) were performed by non-physician practitioner and as supervising physician I was immediately available for consultation/collaboration.  Tiffannie Sloss R. Kayla Deshaies, MD 09/04/12 1510 

## 2012-10-22 ENCOUNTER — Other Ambulatory Visit: Payer: Self-pay | Admitting: Gastroenterology

## 2013-01-05 ENCOUNTER — Emergency Department (HOSPITAL_COMMUNITY): Payer: Medicare Other

## 2013-01-05 ENCOUNTER — Encounter (HOSPITAL_COMMUNITY): Payer: Self-pay | Admitting: Emergency Medicine

## 2013-01-05 ENCOUNTER — Emergency Department (HOSPITAL_COMMUNITY)
Admission: EM | Admit: 2013-01-05 | Discharge: 2013-01-06 | Disposition: A | Discharge status: Home / Self Care | Payer: Medicare Other | Source: Home / Self Care | Attending: Emergency Medicine | Admitting: Emergency Medicine

## 2013-01-05 DIAGNOSIS — S42201A Unspecified fracture of upper end of right humerus, initial encounter for closed fracture: Secondary | ICD-10-CM

## 2013-01-05 MED ORDER — FENTANYL CITRATE 0.05 MG/ML IJ SOLN
50.0000 ug | Freq: Once | INTRAMUSCULAR | Status: AC
  Administered 2013-01-05: 50 ug via INTRAVENOUS
  Filled 2013-01-05: qty 2

## 2013-01-05 MED ORDER — HYDROMORPHONE HCL PF 1 MG/ML IJ SOLN
1.0000 mg | Freq: Once | INTRAMUSCULAR | Status: AC
  Administered 2013-01-05: 1 mg via INTRAVENOUS
  Filled 2013-01-05: qty 1

## 2013-01-05 MED ORDER — ONDANSETRON HCL 4 MG/2ML IJ SOLN
4.0000 mg | Freq: Once | INTRAMUSCULAR | Status: AC
  Administered 2013-01-05: 4 mg via INTRAVENOUS
  Filled 2013-01-05: qty 2

## 2013-01-05 MED ORDER — OXYCODONE-ACETAMINOPHEN 5-325 MG PO TABS: 2.0000 | ORAL_TABLET | ORAL | Status: DC | PRN

## 2013-01-05 NOTE — ED Notes (Signed)
Patient states she was cleaning in her kitchen and tripped over the dishwasher drawer and fell on her right side. She denies hitting her head or loosing consciousness. She complains of right shoulder, arm, and hip pain.

## 2013-01-05 NOTE — ED Notes (Signed)
Patient transported to X-ray 

## 2013-01-05 NOTE — ED Notes (Signed)
Bed: WG95 Expected date: 01/05/13 Expected time: 8:24 PM Means of arrival: Ambulance Comments: Bed 23, EMS, 38 F, Fall

## 2013-01-05 NOTE — ED Provider Notes (Signed)
CSN: [id number]     Arrival date & time 01/05/13  2029 History   First MD Initiated Contact with Patient 01/05/13 2109     Chief Complaint  Patient presents with  . Fall   (Consider location/radiation/quality/duration/timing/severity/associated sxs/prior Treatment) HPI Comments: Patient presents with shoulder and elbow pain after a fall. She states she was working in [employer] and tripped over a two-hour falling over onto her right side. She has constant throbbing pain to her right shoulder and her right elbow. She status post a radial head fracture appears several years ago to her right elbow. She does complain of some pain in her neck but denies any significant pain in her back. She denies any numbness or weakness in her extremities other than she feels some intermittent tingling in her right hand. She denies any head injury or loss of consciousness. She has had prior neck surgery and initially after the fall her neck was hurting that since she's been in the ED she says it's more throbbing. She feels generally sore over but denies any other specific areas of tenderness. She does have a history of fibromyalgia. She has an injury to her left lower leg where she sleeps on the dishwasher. She states her tetanus shot is up-to-date.  Patient is a 67 y.o. female presenting with fall.  Fall Pertinent negatives include no chest pain, no abdominal pain, no headaches and no shortness of breath.    Past Medical History  Diagnosis Date  . Hypertension    Past Surgical History  Procedure Laterality Date  . Tubular plasity    . Cervical fusion    . Breast lumpectomy    . Arcuate keratectomy    . Radial head replacement    . Nicen fundo placation     History reviewed. No pertinent family history. History  Substance Use Topics  . Smoking status: Former Games developer  . Smokeless tobacco: Not on file  . Alcohol Use: Yes   OB History   Grav Para Term Preterm Abortions TAB SAB Ect Mult Living       1     Review of Systems  Constitutional: Negative for fever, chills, diaphoresis and fatigue.  HENT: Negative for congestion, rhinorrhea and sneezing.   Eyes: Negative.   Respiratory: Negative for cough, chest tightness and shortness of breath.   Cardiovascular: Negative for chest pain and leg swelling.  Gastrointestinal: Negative for nausea, vomiting, abdominal pain, diarrhea and blood in stool.  Genitourinary: Negative for frequency, hematuria, flank pain and difficulty urinating.  Musculoskeletal: Positive for arthralgias, joint swelling and neck pain. Negative for back pain.  Skin: Negative for rash.  Neurological: Negative for dizziness, speech difficulty, weakness, light-headedness, numbness and headaches.    Allergies  Review of patient's allergies indicates no known allergies.  Home Medications   Current Outpatient Rx  Name  Route  Sig  Dispense  Refill  . acetaminophen (TYLENOL) 500 MG tablet   Oral   Take 500 mg by mouth every 6 (six) hours as needed for mild pain or headache (pain).          Marland Kitchen BIOTIN 5000 PO   Oral   Take 1 capsule by mouth every morning.         . cholecalciferol (VITAMIN D) 1000 UNITS tablet   Oral   Take 1,000 Units by mouth every morning.         . conjugated estrogens (PREMARIN) vaginal cream   Vaginal   Place 1 g vaginally daily  as needed (dryness).         . fexofenadine (ALLEGRA) 180 MG tablet   Oral   Take 180 mg by mouth daily as needed for allergies.          . fluticasone (FLONASE) 50 MCG/ACT nasal spray   Nasal   Place 2 sprays into the nose daily as needed for rhinitis.         Marland Kitchen LORazepam (ATIVAN) 0.5 MG tablet   Oral   Take 0.5 mg by mouth every 8 (eight) hours. Anxiety         . losartan (COZAAR) 50 MG tablet   Oral   Take 50 mg by mouth every morning.          Ethelda Chick (OYSTER CALCIUM) 500 MG TABS tablet   Oral   Take 1,000 mg of elemental calcium by mouth every morning.         .  predniSONE (DELTASONE) 1 MG tablet   Oral   Take 2 mg by mouth daily.          . sertraline (ZOLOFT) 100 MG tablet   Oral   Take 100 mg by mouth every morning.          . vitamin B-12 (CYANOCOBALAMIN) 1000 MCG tablet   Oral   Take 2,000 mcg by mouth every morning.         Marland Kitchen oxyCODONE-acetaminophen (PERCOCET) 5-325 MG per tablet   Oral   Take 2 tablets by mouth every 4 (four) hours as needed.   20 tablet   0    BP 172/90  Pulse 65  Temp(Src) 98.1 F (36.7 C) (Oral)  Resp 22  SpO2 100% Physical Exam  Constitutional: She is oriented to person, place, and time. She appears well-developed and well-nourished.  HENT:  Head: Normocephalic and atraumatic.  Eyes: Pupils are equal, round, and reactive to light.  Neck:  Positive tenderness to the mid and lower cervical spine. No step-offs or deformities are noted. There's no pain along the thoracic or lumbosacral spine.  Cardiovascular: Normal rate, regular rhythm and normal heart sounds.   Pulmonary/Chest: Effort normal and breath sounds normal. No respiratory distress. She has no wheezes. She has no rales. She exhibits no tenderness.  Abdominal: Soft. Bowel sounds are normal. There is no tenderness. There is no rebound and no guarding.  Musculoskeletal: Normal range of motion. She exhibits no edema.  She has some swelling and pain over the right proximal humerus. There is no pain over the clavicles. She does have some tenderness to palpation of the right elbow. There's no deformity or swelling in this area. There's tenderness to the right wrist but there's no swelling or deformity to the area. There's no other significant pain on palpation or range of motion extremities. She has normal sensation, motor function and pulses in the right hand.  Lymphadenopathy:    She has no cervical adenopathy.  Neurological: She is alert and oriented to person, place, and time.  Skin: Skin is warm and dry. No rash noted.  Psychiatric: She has a  normal mood and affect.    ED Course  Procedures (including critical care time) Labs Review Labs Reviewed - No data to display Imaging Review Dg Shoulder Right  01/05/2013   CLINICAL DATA:  Right shoulder pain following a fall today.  EXAM: RIGHT SHOULDER - 2+ VIEW  COMPARISON:  11/04/2003.  FINDINGS: Interval impacted fracture involving the right humeral neck, proximal metaphysis and greater tuberosity. Posterior  angulation and anterior displacement of the distal fragment.  IMPRESSION: Comminuted and impacted proximal right humerus fracture, as described above.   Electronically Signed   By: Gordan Payment M.D.   On: 01/05/2013 21:43   Dg Elbow Complete Right  01/05/2013   CLINICAL DATA:  Right elbow pain following a fall today.  EXAM: RIGHT ELBOW - COMPLETE 3+ VIEW  COMPARISON:  11/04/2003.  FINDINGS: Interval radial head prosthesis. Posterior olecranon and distal humerus spur formation. No acute fracture, dislocation or effusion seen.  IMPRESSION: No acute fracture, dislocation or effusion.   Electronically Signed   By: Gordan Payment M.D.   On: 01/05/2013 21:45   Dg Wrist Complete Right  01/05/2013   CLINICAL DATA:  Right wrist pain following a fall today.  EXAM: RIGHT WRIST - COMPLETE 3+ VIEW  COMPARISON:  11/04/2003.  FINDINGS: Degenerative changes at the articulation of the 1st metacarpal and trapezium. No fracture or dislocation.  IMPRESSION: No fracture.   Electronically Signed   By: Gordan Payment M.D.   On: 01/05/2013 22:55   Ct Cervical Spine Wo Contrast  01/05/2013   CLINICAL DATA:  Neck pain following a fall today.  EXAM: CT CERVICAL SPINE WITHOUT CONTRAST  TECHNIQUE: Multidetector CT imaging of the cervical spine was performed without intravenous contrast. Multiplanar CT image reconstructions were also generated.  COMPARISON:  Cervical spine MR dated 11/09/2009.  FINDINGS: Fusion of the bodies of the C5 and C6 vertebrae. Degenerative changes at the remaining levels of the cervical spine.  Reversal of the normal cervical lordosis. 2 mm of anterolisthesis at the C2-3 level, without significant change. 3 mm of anterolisthesis at the C7-T1 level, also without significant change. No prevertebral soft tissue swelling, fractures or acute subluxations.  IMPRESSION: 1. Postoperative and degenerative changes, as described above. 2. No fracture or acute subluxation.   Electronically Signed   By: Gordan Payment M.D.   On: 01/05/2013 22:53    EKG Interpretation   None       MDM   1. Proximal humerus fracture, right, closed, initial encounter    Patient has a comminuted proximal humerus fracture. She's neurovascularly intact. No other fractures or significant injuries were identified. She was placed in a sling immobilizer. She was advised to sleep semi-reclining and to ice the area. She was given prescription for Percocet to use for pain. She was advised to avoid Tylenol taking Percocet. She was advised to call tomorrow to schedule appointment within the next week to follow up with her orthopedist.    Rolan Bucco, MD 01/05/13 2320

## 2013-01-05 NOTE — Progress Notes (Signed)
c-collar applied  

## 2013-01-05 NOTE — ED Notes (Signed)
Patient transported to CT 

## 2013-01-06 ENCOUNTER — Other Ambulatory Visit: Payer: Self-pay | Admitting: Orthopaedic Surgery

## 2013-01-06 ENCOUNTER — Ambulatory Visit
Admission: RE | Admit: 2013-01-06 | Discharge: 2013-01-06 | Disposition: A | Discharge status: Home / Self Care | Payer: Medicare Other | Source: Ambulatory Visit | Attending: Orthopaedic Surgery | Admitting: Orthopaedic Surgery

## 2013-01-06 ENCOUNTER — Other Ambulatory Visit (HOSPITAL_COMMUNITY): Payer: Self-pay | Admitting: Orthopaedic Surgery

## 2013-01-06 ENCOUNTER — Encounter (HOSPITAL_COMMUNITY): Payer: Self-pay

## 2013-01-06 ENCOUNTER — Encounter (HOSPITAL_COMMUNITY)
Admission: RE | Admit: 2013-01-06 | Discharge: 2013-01-06 | Disposition: A | Discharge status: Home / Self Care | Payer: Medicare Other | Source: Ambulatory Visit | Attending: Orthopaedic Surgery | Admitting: Orthopaedic Surgery

## 2013-01-06 DIAGNOSIS — R52 Pain, unspecified: Secondary | ICD-10-CM

## 2013-01-06 DIAGNOSIS — T148XXA Other injury of unspecified body region, initial encounter: Secondary | ICD-10-CM

## 2013-01-06 HISTORY — DX: Nausea with vomiting, unspecified: R11.2

## 2013-01-06 HISTORY — DX: Depression, unspecified: F32.A

## 2013-01-06 HISTORY — DX: Headache: R51

## 2013-01-06 HISTORY — DX: Gastro-esophageal reflux disease without esophagitis: K21.9

## 2013-01-06 HISTORY — DX: Fibromyalgia: M79.7

## 2013-01-06 HISTORY — DX: Sleep apnea, unspecified: G47.30

## 2013-01-06 HISTORY — DX: Adverse effect of unspecified anesthetic, initial encounter: T41.45XA

## 2013-01-06 HISTORY — DX: Anxiety disorder, unspecified: F41.9

## 2013-01-06 HISTORY — DX: Malignant (primary) neoplasm, unspecified: C80.1

## 2013-01-06 HISTORY — DX: Other specified disorders of bone density and structure, unspecified site: M85.80

## 2013-01-06 HISTORY — DX: Other complications of anesthesia, initial encounter: T88.59XA

## 2013-01-06 HISTORY — DX: Irritable bowel syndrome, unspecified: K58.9

## 2013-01-06 HISTORY — DX: Personal history of other diseases of the digestive system: Z87.19

## 2013-01-06 HISTORY — DX: Major depressive disorder, single episode, unspecified: F32.9

## 2013-01-06 HISTORY — DX: Other specified postprocedural states: Z98.890

## 2013-01-06 LAB — COMPREHENSIVE METABOLIC PANEL
ALT: 15 U/L (ref 0–35)
AST: 18 U/L (ref 0–37)
Albumin: 3.9 g/dL (ref 3.5–5.2)
Alkaline Phosphatase: 68 U/L (ref 39–117)
BUN: 17 mg/dL (ref 6–23)
CO2: 25 mEq/L (ref 19–32)
Calcium: 9.3 mg/dL (ref 8.4–10.5)
Chloride: 101 mEq/L (ref 96–112)
Creatinine, Ser: 0.87 mg/dL (ref 0.50–1.10)
GFR calc Af Amer: 77 mL/min — ABNORMAL LOW (ref >90)
GFR calc non Af Amer: 66 mL/min — ABNORMAL LOW (ref >90)
Glucose, Bld: 135 mg/dL — ABNORMAL HIGH (ref 70–99)
Potassium: 4.2 mEq/L (ref 3.5–5.1)
Sodium: 137 mEq/L (ref 135–145)
Total Bilirubin: 0.6 mg/dL (ref 0.3–1.2)
Total Protein: 7.5 g/dL (ref 6.0–8.3)

## 2013-01-06 LAB — CBC
HCT: 39.5 % (ref 36.0–46.0)
Hemoglobin: 13.3 g/dL (ref 12.0–15.0)
MCH: 29 pg (ref 26.0–34.0)
MCHC: 33.7 g/dL (ref 30.0–36.0)
MCV: 86.2 fL (ref 78.0–100.0)
Platelets: 253 10*3/uL (ref 150–400)
RBC: 4.58 MIL/uL (ref 3.87–5.11)
RDW: 13.4 % (ref 11.5–15.5)
WBC: 15.3 10*3/uL — ABNORMAL HIGH (ref 4.0–10.5)

## 2013-01-06 LAB — SURGICAL PCR SCREEN
MRSA, PCR: NEGATIVE
Staphylococcus aureus: POSITIVE — AB

## 2013-01-06 NOTE — Progress Notes (Signed)
Spoke with Bonnie Juarez from Dr. Ophelia Charter office regarding abnormal lab value (WBC 15.3) and calling in Mupirocin prescription.

## 2013-01-06 NOTE — Pre-Procedure Instructions (Signed)
Bonnie Juarez  01/06/2013   Your procedure is scheduled on:  Wednesday, January 08, 2013  Report to [facility] Short Stay (use Main Entrance "A'') at 12:30 PM.  Call this number if you have problems the morning of surgery: [phone]   Remember:   Do not eat food or drink liquids after midnight.   Take these medicines the morning of surgery with A SIP OF WATER: LORazepam (ATIVAN) 0.5 MG tablet, predniSONE (DELTASONE) 1 MG tablet, sertraline (ZOLOFT) 100 MG tablet, if needed:oxyCODONE-acetaminophen (PERCOCET) 5-325 MG per tablet  for pain, fluticasone (FLONASE) 50 MCG/ACT nasal spray for rhinitis, fexofenadine (ALLEGRA) 180 MG tablet for allergies,  Stop taking Aspirin, vitamins, and herbal medications. Do not take any NSAIDs ie: Ibuprofen, Advil, Naproxen or any medication containing Aspirin.  Do not wear jewelry, make-up or nail polish.  Do not wear lotions, powders, or perfumes. You may NOT wear deodorant.  Do not shave 48 hours prior to surgery. Men may shave face and neck.  Do not bring valuables to the hospital.  [facility] is not responsible  for any belongings or valuables.               Contacts, dentures or bridgework may not be worn into surgery.  Leave suitcase in the car. After surgery it may be brought to your room.  For patients admitted to the hospital, discharge time is determined by your treatment team.               Patients discharged the day of surgery will not be allowed to drive home.  Name and phone number of your driver:  Special Instructions: Shower using CHG 2 nights before surgery and the night before surgery.  If you shower the day of surgery use CHG.  Use special wash - you have one bottle of CHG for all showers.  You should use approximately 1/3 of the bottle for each shower.   Please read over the following fact sheets that you were given: Pain Booklet, Coughing and Deep Breathing, MRSA Information and Surgical Site Infection Prevention

## 2013-01-07 ENCOUNTER — Encounter (HOSPITAL_COMMUNITY): Payer: Self-pay | Admitting: Pharmacy Technician

## 2013-01-07 MED ORDER — CEFAZOLIN SODIUM-DEXTROSE 2-3 GM-% IV SOLR
2.0000 g | INTRAVENOUS | Status: AC
  Administered 2013-01-08: 2 g via INTRAVENOUS
  Filled 2013-01-07: qty 50

## 2013-01-07 NOTE — Progress Notes (Addendum)
Anesthesia chart review:  Patient is a 67 year old female scheduled for ORIF of right proximal humerus fracture on 01/08/13 by Dr. Ophelia Charter.  History includes OSA, HTN, GERD, IBS, anxiety, depression, hiatal hernia, fibromyalgia, cervical fusion, post-operative N/V, former smoker.  Medication list includes prednisone. PCP is Dr. Selena Batten.  She was referred to cardiologist Dr. Armanda Magic in August 2014 for chest pain and had a non ischemic stress test.  Cardiology follow-up is listed as PRN.  EKG on 09/04/12 showed SR, LAD, consider LAFB, possible inferior infarct (age undetermined).  It was not felt significantly changed since 10/14/08 (see Muse).  Echo on 10/30/12 showed mld concentric LVH, EF 70.7%, borderline LAe, trace MR, trivial TR, doppler findings suggestive of grade II diastolic dysfunction with elevated LA pressure.  Nuclear stress test on 10/15/12 showed normal myocardial perfusion in all regions, post stress EF 85%, no regional wall motion abnormalities.  CXR on 09/04/12 showed: Hyperinflation with central interstitial changes suggest reactive airways disease or asthma. Emphysema can also have this appearance. No focal consolidation.   Labs from 09/05/12 noted.  Her EKG appears stable.  Dr. Ophelia Charter office was notified by PAT RN of mild leukocytosis.  She is on prednisone.  I'm not sure if this long term, but can be further addressed when she is evaluated by her anesthesiologist tomorrow.  Velna Ochs [facility]/Anesthesiology Phone [phone] 01/07/2013 10:10 AM

## 2013-01-08 ENCOUNTER — Inpatient Hospital Stay (HOSPITAL_COMMUNITY): Payer: Medicare Other | Admitting: Anesthesiology

## 2013-01-08 ENCOUNTER — Inpatient Hospital Stay (HOSPITAL_COMMUNITY)
Admission: RE | Admit: 2013-01-08 | Discharge: 2013-01-10 | DRG: 494 | Disposition: A | Discharge status: Home / Self Care | Payer: Medicare Other | Source: Ambulatory Visit | Attending: Orthopaedic Surgery | Admitting: Orthopaedic Surgery

## 2013-01-08 ENCOUNTER — Inpatient Hospital Stay (HOSPITAL_COMMUNITY): Payer: Medicare Other

## 2013-01-08 ENCOUNTER — Encounter (HOSPITAL_COMMUNITY): Payer: Medicare Other | Admitting: Vascular Surgery

## 2013-01-08 ENCOUNTER — Encounter (HOSPITAL_COMMUNITY): Payer: Self-pay

## 2013-01-08 ENCOUNTER — Encounter (HOSPITAL_COMMUNITY)
Admission: RE | Disposition: A | Discharge status: Home / Self Care | Payer: Self-pay | Source: Ambulatory Visit | Attending: Orthopaedic Surgery

## 2013-01-08 DIAGNOSIS — S42201A Unspecified fracture of upper end of right humerus, initial encounter for closed fracture: Secondary | ICD-10-CM

## 2013-01-08 DIAGNOSIS — Z981 Arthrodesis status: Secondary | ICD-10-CM

## 2013-01-08 DIAGNOSIS — S42209A Unspecified fracture of upper end of unspecified humerus, initial encounter for closed fracture: Principal | ICD-10-CM | POA: Diagnosis present

## 2013-01-08 DIAGNOSIS — W19XXXA Unspecified fall, initial encounter: Secondary | ICD-10-CM | POA: Diagnosis present

## 2013-01-08 DIAGNOSIS — Z87891 Personal history of nicotine dependence: Secondary | ICD-10-CM

## 2013-01-08 DIAGNOSIS — I10 Essential (primary) hypertension: Secondary | ICD-10-CM | POA: Diagnosis present

## 2013-01-08 HISTORY — PX: ORIF HUMERUS FRACTURE: SHX2126

## 2013-01-08 LAB — URINALYSIS, ROUTINE W REFLEX MICROSCOPIC
Glucose, UA: NEGATIVE mg/dL
Hgb urine dipstick: NEGATIVE
Ketones, ur: 15 mg/dL — AB
Leukocytes, UA: NEGATIVE
Nitrite: NEGATIVE
Protein, ur: NEGATIVE mg/dL
Specific Gravity, Urine: 1.026 (ref 1.005–1.030)
Urobilinogen, UA: 1 mg/dL (ref 0.0–1.0)
pH: 7 (ref 5.0–8.0)

## 2013-01-08 SURGERY — OPEN REDUCTION INTERNAL FIXATION (ORIF) PROXIMAL HUMERUS FRACTURE
Anesthesia: General | Site: Arm Upper | Laterality: Right | Wound class: Clean

## 2013-01-08 MED ORDER — CHLORHEXIDINE GLUCONATE 4 % EX LIQD: 60.0000 mL | Freq: Once | CUTANEOUS | Status: DC

## 2013-01-08 MED ORDER — OXYCODONE-ACETAMINOPHEN 5-325 MG PO TABS
1.0000 | ORAL_TABLET | ORAL | Status: DC | PRN
  Administered 2013-01-08 – 2013-01-10 (×5): 2 via ORAL
  Filled 2013-01-08 (×4): qty 2

## 2013-01-08 MED ORDER — MIDAZOLAM HCL 5 MG/5ML IJ SOLN
INTRAMUSCULAR | Status: DC | PRN
  Administered 2013-01-08: 2 mg via INTRAVENOUS

## 2013-01-08 MED ORDER — ACETAMINOPHEN 10 MG/ML IV SOLN
INTRAVENOUS | Status: DC | PRN
  Administered 2013-01-08: 1000 mg via INTRAVENOUS

## 2013-01-08 MED ORDER — ONDANSETRON HCL 4 MG/2ML IJ SOLN
INTRAMUSCULAR | Status: AC
  Filled 2013-01-08: qty 2

## 2013-01-08 MED ORDER — ZOLPIDEM TARTRATE 5 MG PO TABS: 5.0000 mg | ORAL_TABLET | Freq: Every evening | ORAL | Status: DC | PRN

## 2013-01-08 MED ORDER — OXYCODONE-ACETAMINOPHEN 5-325 MG PO TABS
ORAL_TABLET | ORAL | Status: AC
  Administered 2013-01-08: 2 via ORAL
  Filled 2013-01-08: qty 2

## 2013-01-08 MED ORDER — FENTANYL CITRATE 0.05 MG/ML IJ SOLN
INTRAMUSCULAR | Status: AC
  Administered 2013-01-08: 100 ug
  Filled 2013-01-08: qty 2

## 2013-01-08 MED ORDER — LACTATED RINGERS IV SOLN
INTRAVENOUS | Status: DC
  Administered 2013-01-08: 13:00:00 via INTRAVENOUS

## 2013-01-08 MED ORDER — GLYCOPYRROLATE 0.2 MG/ML IJ SOLN
INTRAMUSCULAR | Status: DC | PRN
  Administered 2013-01-08: 0.6 mg via INTRAVENOUS

## 2013-01-08 MED ORDER — HYDROMORPHONE HCL PF 1 MG/ML IJ SOLN: 0.2500 mg | INTRAMUSCULAR | Status: DC | PRN

## 2013-01-08 MED ORDER — KCL IN DEXTROSE-NACL 20-5-0.45 MEQ/L-%-% IV SOLN
INTRAVENOUS | Status: DC
  Administered 2013-01-08 – 2013-01-09 (×2): via INTRAVENOUS
  Filled 2013-01-08 (×5): qty 1000

## 2013-01-08 MED ORDER — ACETAMINOPHEN 10 MG/ML IV SOLN: 1000.0000 mg | Freq: Four times a day (QID) | INTRAVENOUS | Status: DC

## 2013-01-08 MED ORDER — PHENOL 1.4 % MT LIQD: 1.0000 | OROMUCOSAL | Status: DC | PRN

## 2013-01-08 MED ORDER — METOCLOPRAMIDE HCL 5 MG/ML IJ SOLN: 5.0000 mg | Freq: Three times a day (TID) | INTRAMUSCULAR | Status: DC | PRN

## 2013-01-08 MED ORDER — EPHEDRINE SULFATE 50 MG/ML IJ SOLN
INTRAMUSCULAR | Status: DC | PRN
  Administered 2013-01-08: 5 mg via INTRAVENOUS

## 2013-01-08 MED ORDER — METHOCARBAMOL 500 MG PO TABS: 500.0000 mg | ORAL_TABLET | Freq: Four times a day (QID) | ORAL | Status: DC | PRN

## 2013-01-08 MED ORDER — HYDROCORTISONE SOD SUCCINATE 100 MG IJ SOLR
100.0000 mg | Freq: Two times a day (BID) | INTRAMUSCULAR | Status: AC
  Administered 2013-01-08 – 2013-01-09 (×2): 100 mg via INTRAVENOUS
  Filled 2013-01-08 (×2): qty 2

## 2013-01-08 MED ORDER — BISACODYL 10 MG RE SUPP: 10.0000 mg | Freq: Every day | RECTAL | Status: DC | PRN

## 2013-01-08 MED ORDER — 0.9 % SODIUM CHLORIDE (POUR BTL) OPTIME
TOPICAL | Status: DC | PRN
  Administered 2013-01-08: 1000 mL

## 2013-01-08 MED ORDER — METOCLOPRAMIDE HCL 5 MG PO TABS: 5.0000 mg | ORAL_TABLET | Freq: Three times a day (TID) | ORAL | Status: DC | PRN

## 2013-01-08 MED ORDER — KETOROLAC TROMETHAMINE 30 MG/ML IJ SOLN
INTRAMUSCULAR | Status: AC
  Filled 2013-01-08: qty 1

## 2013-01-08 MED ORDER — METHOCARBAMOL 500 MG PO TABS
ORAL_TABLET | ORAL | Status: AC
  Administered 2013-01-08: 500 mg via ORAL
  Filled 2013-01-08: qty 1

## 2013-01-08 MED ORDER — FLEET ENEMA 7-19 GM/118ML RE ENEM
1.0000 | ENEMA | Freq: Once | RECTAL | Status: AC | PRN
  Filled 2013-01-08: qty 1

## 2013-01-08 MED ORDER — PROPOFOL 10 MG/ML IV BOLUS
INTRAVENOUS | Status: DC | PRN
  Administered 2013-01-08: 100 mg via INTRAVENOUS
  Administered 2013-01-08: 40 mg via INTRAVENOUS

## 2013-01-08 MED ORDER — FLUTICASONE PROPIONATE 50 MCG/ACT NA SUSP: 2.0000 | Freq: Every day | NASAL | Status: DC | PRN

## 2013-01-08 MED ORDER — ESTROGENS, CONJUGATED 0.625 MG/GM VA CREA
1.0000 g | TOPICAL_CREAM | Freq: Every day | VAGINAL | Status: DC | PRN
  Filled 2013-01-08: qty 42.5

## 2013-01-08 MED ORDER — LACTATED RINGERS IV SOLN
INTRAVENOUS | Status: DC | PRN
  Administered 2013-01-08 (×2): via INTRAVENOUS

## 2013-01-08 MED ORDER — ROCURONIUM BROMIDE 100 MG/10ML IV SOLN
INTRAVENOUS | Status: DC | PRN
  Administered 2013-01-08: 30 mg via INTRAVENOUS

## 2013-01-08 MED ORDER — ACETAMINOPHEN 325 MG PO TABS: 650.0000 mg | ORAL_TABLET | Freq: Four times a day (QID) | ORAL | Status: DC | PRN

## 2013-01-08 MED ORDER — ACETAMINOPHEN 10 MG/ML IV SOLN
INTRAVENOUS | Status: AC
  Filled 2013-01-08: qty 100

## 2013-01-08 MED ORDER — SENNOSIDES-DOCUSATE SODIUM 8.6-50 MG PO TABS: 1.0000 | ORAL_TABLET | Freq: Every evening | ORAL | Status: DC | PRN

## 2013-01-08 MED ORDER — ONDANSETRON HCL 4 MG PO TABS: 4.0000 mg | ORAL_TABLET | Freq: Four times a day (QID) | ORAL | Status: DC | PRN

## 2013-01-08 MED ORDER — ONDANSETRON HCL 4 MG/2ML IJ SOLN
INTRAMUSCULAR | Status: DC | PRN
  Administered 2013-01-08: 4 mg via INTRAVENOUS

## 2013-01-08 MED ORDER — FENTANYL CITRATE 0.05 MG/ML IJ SOLN
INTRAMUSCULAR | Status: DC | PRN
  Administered 2013-01-08: 50 ug via INTRAVENOUS
  Administered 2013-01-08: 100 ug via INTRAVENOUS
  Administered 2013-01-08 (×2): 50 ug via INTRAVENOUS

## 2013-01-08 MED ORDER — ONDANSETRON HCL 4 MG/2ML IJ SOLN: 4.0000 mg | Freq: Four times a day (QID) | INTRAMUSCULAR | Status: DC | PRN

## 2013-01-08 MED ORDER — BUPIVACAINE-EPINEPHRINE PF 0.5-1:200000 % IJ SOLN
INTRAMUSCULAR | Status: DC | PRN
  Administered 2013-01-08: 30 mL

## 2013-01-08 MED ORDER — SERTRALINE HCL 100 MG PO TABS
100.0000 mg | ORAL_TABLET | Freq: Every morning | ORAL | Status: DC
  Administered 2013-01-09 – 2013-01-10 (×2): 100 mg via ORAL
  Filled 2013-01-08 (×2): qty 1

## 2013-01-08 MED ORDER — HYDROCODONE-ACETAMINOPHEN 5-325 MG PO TABS
1.0000 | ORAL_TABLET | ORAL | Status: DC | PRN
  Administered 2013-01-09: 1 via ORAL
  Administered 2013-01-10: 2 via ORAL
  Filled 2013-01-08: qty 2
  Filled 2013-01-08: qty 1

## 2013-01-08 MED ORDER — METHOCARBAMOL 500 MG PO TABS
500.0000 mg | ORAL_TABLET | Freq: Four times a day (QID) | ORAL | Status: DC | PRN
  Administered 2013-01-08 – 2013-01-09 (×3): 500 mg via ORAL
  Filled 2013-01-08 (×2): qty 1

## 2013-01-08 MED ORDER — MENTHOL 3 MG MT LOZG: 1.0000 | LOZENGE | OROMUCOSAL | Status: DC | PRN

## 2013-01-08 MED ORDER — CEFAZOLIN SODIUM 1-5 GM-% IV SOLN
1.0000 g | Freq: Four times a day (QID) | INTRAVENOUS | Status: AC
  Administered 2013-01-08 – 2013-01-09 (×3): 1 g via INTRAVENOUS
  Filled 2013-01-08 (×3): qty 50

## 2013-01-08 MED ORDER — NEOSTIGMINE METHYLSULFATE 1 MG/ML IJ SOLN
INTRAMUSCULAR | Status: DC | PRN
  Administered 2013-01-08: 5 mg via INTRAVENOUS

## 2013-01-08 MED ORDER — MORPHINE SULFATE 2 MG/ML IJ SOLN
1.0000 mg | INTRAMUSCULAR | Status: DC | PRN
  Administered 2013-01-09: 1 mg via INTRAVENOUS
  Filled 2013-01-08: qty 1

## 2013-01-08 MED ORDER — DOCUSATE SODIUM 100 MG PO CAPS
100.0000 mg | ORAL_CAPSULE | Freq: Two times a day (BID) | ORAL | Status: DC
  Administered 2013-01-08 – 2013-01-10 (×4): 100 mg via ORAL
  Filled 2013-01-08 (×4): qty 1

## 2013-01-08 MED ORDER — LOSARTAN POTASSIUM 50 MG PO TABS
50.0000 mg | ORAL_TABLET | Freq: Every morning | ORAL | Status: DC
  Administered 2013-01-09 – 2013-01-10 (×2): 50 mg via ORAL
  Filled 2013-01-08 (×2): qty 1

## 2013-01-08 MED ORDER — HYDROCORTISONE SOD SUCCINATE 100 MG IJ SOLR
100.0000 mg | INTRAMUSCULAR | Status: AC
  Administered 2013-01-08: 100 mg via INTRAVENOUS
  Filled 2013-01-08: qty 2

## 2013-01-08 MED ORDER — KETOROLAC TROMETHAMINE 15 MG/ML IJ SOLN
15.0000 mg | Freq: Three times a day (TID) | INTRAMUSCULAR | Status: AC
  Administered 2013-01-08 – 2013-01-09 (×3): 15 mg via INTRAVENOUS
  Filled 2013-01-08 (×2): qty 1

## 2013-01-08 MED ORDER — DIPHENHYDRAMINE HCL 12.5 MG/5ML PO ELIX: 12.5000 mg | ORAL_SOLUTION | ORAL | Status: DC | PRN

## 2013-01-08 MED ORDER — PREDNISONE 1 MG PO TABS
2.0000 mg | ORAL_TABLET | Freq: Every day | ORAL | Status: DC
  Administered 2013-01-09 – 2013-01-10 (×2): 2 mg via ORAL
  Filled 2013-01-08 (×2): qty 2

## 2013-01-08 MED ORDER — LIDOCAINE HCL (CARDIAC) 20 MG/ML IV SOLN
INTRAVENOUS | Status: DC | PRN
  Administered 2013-01-08: 40 mg via INTRAVENOUS

## 2013-01-08 MED ORDER — PANTOPRAZOLE SODIUM 40 MG PO TBEC
40.0000 mg | DELAYED_RELEASE_TABLET | Freq: Every day | ORAL | Status: DC
  Administered 2013-01-09 – 2013-01-10 (×2): 40 mg via ORAL
  Filled 2013-01-08 (×2): qty 1

## 2013-01-08 MED ORDER — ACETAMINOPHEN 650 MG RE SUPP: 650.0000 mg | Freq: Four times a day (QID) | RECTAL | Status: DC | PRN

## 2013-01-08 MED ORDER — LORAZEPAM 0.5 MG PO TABS
0.5000 mg | ORAL_TABLET | Freq: Three times a day (TID) | ORAL | Status: DC
  Administered 2013-01-08 – 2013-01-10 (×5): 0.5 mg via ORAL
  Filled 2013-01-08 (×5): qty 1

## 2013-01-08 MED ORDER — METHOCARBAMOL 100 MG/ML IJ SOLN
500.0000 mg | Freq: Four times a day (QID) | INTRAVENOUS | Status: DC | PRN
  Filled 2013-01-08: qty 5

## 2013-01-08 MED ORDER — LORATADINE 10 MG PO TABS
10.0000 mg | ORAL_TABLET | Freq: Every day | ORAL | Status: DC
  Administered 2013-01-09 – 2013-01-10 (×2): 10 mg via ORAL
  Filled 2013-01-08 (×2): qty 1

## 2013-01-08 MED ORDER — MIDAZOLAM HCL 2 MG/2ML IJ SOLN
INTRAMUSCULAR | Status: AC
  Administered 2013-01-08: 1 mg
  Filled 2013-01-08: qty 2

## 2013-01-08 MED ORDER — OXYCODONE-ACETAMINOPHEN 5-325 MG PO TABS: 1.0000 | ORAL_TABLET | ORAL | Status: DC | PRN

## 2013-01-08 MED ORDER — PHENYLEPHRINE HCL 10 MG/ML IJ SOLN
INTRAMUSCULAR | Status: DC | PRN
  Administered 2013-01-08 (×2): 80 ug via INTRAVENOUS

## 2013-01-08 MED ORDER — ONDANSETRON HCL 4 MG/2ML IJ SOLN
4.0000 mg | Freq: Once | INTRAMUSCULAR | Status: AC | PRN
  Administered 2013-01-08: 4 mg via INTRAVENOUS

## 2013-01-08 SURGICAL SUPPLY — 56 items
APL SKNCLS STERI-STRIP NONHPOA (GAUZE/BANDAGES/DRESSINGS) ×1
BENZOIN TINCTURE PRP APPL 2/3 (GAUZE/BANDAGES/DRESSINGS) ×2 IMPLANT
BIT DRILL 2.8X4 QC CORT (BIT) ×1 IMPLANT
BIT DRILL 4 LONG FAST STEP (BIT) ×1 IMPLANT
BIT DRILL 4 SHORT FAST STEP (BIT) ×1 IMPLANT
CLOTH BEACON ORANGE TIMEOUT ST (SAFETY) ×1 IMPLANT
CLSR STERI-STRIP ANTIMIC 1/2X4 (GAUZE/BANDAGES/DRESSINGS) ×1 IMPLANT
COVER SURGICAL LIGHT HANDLE (MISCELLANEOUS) ×2 IMPLANT
DRAPE C-ARM 42X72 X-RAY (DRAPES) ×2 IMPLANT
DRAPE INCISE IOBAN 66X45 STRL (DRAPES) ×1 IMPLANT
DRAPE SURG 17X23 STRL (DRAPES) ×2 IMPLANT
DRAPE U-SHAPE 47X51 STRL (DRAPES) ×2 IMPLANT
DRSG EMULSION OIL 3X3 NADH (GAUZE/BANDAGES/DRESSINGS) ×2 IMPLANT
DRSG PAD ABDOMINAL 8X10 ST (GAUZE/BANDAGES/DRESSINGS) ×1 IMPLANT
ELECT REM PT RETURN 9FT ADLT (ELECTROSURGICAL) ×2
ELECTRODE REM PT RTRN 9FT ADLT (ELECTROSURGICAL) ×1 IMPLANT
GLOVE BIOGEL PI IND STRL 7.5 (GLOVE) ×1 IMPLANT
GLOVE BIOGEL PI IND STRL 8 (GLOVE) ×1 IMPLANT
GLOVE BIOGEL PI INDICATOR 7.5 (GLOVE) ×1
GLOVE BIOGEL PI INDICATOR 8 (GLOVE) ×1
GLOVE ECLIPSE 7.0 STRL STRAW (GLOVE) ×2 IMPLANT
GLOVE ORTHO TXT STRL SZ7.5 (GLOVE) ×2 IMPLANT
GOWN PREVENTION PLUS LG XLONG (DISPOSABLE) ×1 IMPLANT
GOWN STRL NON-REIN LRG LVL3 (GOWN DISPOSABLE) ×2 IMPLANT
KIT BASIN OR (CUSTOM PROCEDURE TRAY) ×2 IMPLANT
KIT ROOM TURNOVER OR (KITS) ×2 IMPLANT
MANIFOLD NEPTUNE II (INSTRUMENTS) ×2 IMPLANT
NDL HYPO 25GX1X1/2 BEV (NEEDLE) IMPLANT
NEEDLE HYPO 25GX1X1/2 BEV (NEEDLE) IMPLANT
NS IRRIG 1000ML POUR BTL (IV SOLUTION) ×2 IMPLANT
PACK SHOULDER (CUSTOM PROCEDURE TRAY) ×2 IMPLANT
PAD ARMBOARD 7.5X6 YLW CONV (MISCELLANEOUS) ×4 IMPLANT
PEG STND 4.0X30MM (Orthopedic Implant) ×2 IMPLANT
PEG STND 4.0X37.5MM (Orthopedic Implant) ×4 IMPLANT
PEG STND 4.0X40MM (Orthopedic Implant) ×8 IMPLANT
PEG STND 4.0X42.5MM (Orthopedic Implant) ×2 IMPLANT
PEGSTD 4.0X30MM (Orthopedic Implant) IMPLANT
PEGSTD 4.0X37.5MM (Orthopedic Implant) IMPLANT
PEGSTD 4.0X40MM (Orthopedic Implant) IMPLANT
PEGSTD 4.0X42.5MM (Orthopedic Implant) IMPLANT
PIN GUIDE SHOULDER 2.0MM (PIN) ×1 IMPLANT
PLATE SHOULDER S3 3HOLE RT (Plate) ×1 IMPLANT
SCREW LOCK 90D ANGLED 3.8X28 (Screw) ×2 IMPLANT
SCREW MULTIDIR 3.8X30 HUMRL (Screw) ×1 IMPLANT
SPONGE GAUZE 4X4 12PLY (GAUZE/BANDAGES/DRESSINGS) ×2 IMPLANT
SPONGE LAP 18X18 X RAY DECT (DISPOSABLE) ×4 IMPLANT
STRIP CLOSURE SKIN 1/2X4 (GAUZE/BANDAGES/DRESSINGS) ×2 IMPLANT
SUCTION FRAZIER TIP 10 FR DISP (SUCTIONS) ×2 IMPLANT
SUT FIBERWIRE #2 38 T-5 BLUE (SUTURE)
SUT VIC AB 2-0 CT1 27 (SUTURE) ×2
SUT VIC AB 2-0 CT1 TAPERPNT 27 (SUTURE) ×1 IMPLANT
SUT VIC AB 3-0 FS2 27 (SUTURE) ×2 IMPLANT
SUTURE FIBERWR #2 38 T-5 BLUE (SUTURE) IMPLANT
SYR CONTROL 10ML LL (SYRINGE) IMPLANT
TAPE CLOTH SURG 4X10 WHT LF (GAUZE/BANDAGES/DRESSINGS) ×1 IMPLANT
WATER STERILE IRR 1000ML POUR (IV SOLUTION) ×1 IMPLANT

## 2013-01-08 NOTE — Progress Notes (Signed)
No bed availability on  [address] [place],  [place] [address] [place], Dr. Ophelia Charter aware.

## 2013-01-08 NOTE — Transfer of Care (Signed)
Immediate Anesthesia Transfer of Care Note  Patient: Bonnie Juarez  Procedure(s) Performed: Procedure(s) with comments: OPEN REDUCTION INTERNAL FIXATION (ORIF) PROXIMAL HUMERUS FRACTURE (Right) - Open Reduction Internal Fixation Right Proximal Humerus Fracture  Patient Location: PACU  Anesthesia Type:GA combined with regional for post-op pain  Level of Consciousness: awake  Airway & Oxygen Therapy: Patient Spontanous Breathing and Patient connected to face mask oxygen  Post-op Assessment: Report given to PACU RN and Post -op Vital signs reviewed and stable  Post vital signs: Reviewed and stable  Complications: No apparent anesthesia complications

## 2013-01-08 NOTE — Interval H&P Note (Signed)
History and Physical Interval Note:  01/08/2013 3:06 PM  Bonnie Juarez  has presented today for surgery, with the diagnosis of Right proximal humerus fracture  The various methods of treatment have been discussed with the patient and family. After consideration of risks, benefits and other options for treatment, the patient has consented to  Procedure(s) with comments: OPEN REDUCTION INTERNAL FIXATION (ORIF) PROXIMAL HUMERUS FRACTURE (Right) - Open Reduction Internal Fixation Right Proximal Humerus Fracture as a surgical intervention .  [name] history has been reviewed, patient examined, no change in status, stable for surgery.  I have reviewed the patient's chart and labs.  Questions were answered to the patient's satisfaction.     Mahdiya Mossberg C

## 2013-01-08 NOTE — Anesthesia Preprocedure Evaluation (Addendum)
Anesthesia Evaluation  Patient identified by MRN, date of birth, ID band Patient awake    Reviewed: Allergy & Precautions, H&P , NPO status , Patient's Chart, lab work & pertinent test results  History of Anesthesia Complications (+) PONV  Airway Mallampati: II  Neck ROM: Limited    Dental  (+) Teeth Intact   Pulmonary sleep apnea , former smoker,  breath sounds clear to auscultation        Cardiovascular hypertension, Rhythm:Regular Rate:Normal     Neuro/Psych  Headaches, Anxiety Depression  Neuromuscular disease    GI/Hepatic hiatal hernia, GERD-  ,  Endo/Other    Renal/GU      Musculoskeletal  (+) Arthritis -, Fibromyalgia -  Abdominal   Peds  Hematology   Anesthesia Other Findings   Reproductive/Obstetrics                         Anesthesia Physical Anesthesia Plan  ASA: II  Anesthesia Plan: General   Post-op Pain Management:    Induction: Intravenous  Airway Management Planned: Oral ETT  Additional Equipment:   Intra-op Plan:   Post-operative Plan: Extubation in OR  Informed Consent: I have reviewed the patients History and Physical, chart, labs and discussed the procedure including the risks, benefits and alternatives for the proposed anesthesia with the patient or authorized representative who has indicated his/her understanding and acceptance.   Dental advisory given  Plan Discussed with: CRNA, Surgeon and Anesthesiologist  Anesthesia Plan Comments: (Previous pain issues and difficulty treating her pain discussed. We plan supraclavicular block this time, and discussed in detail c patient )       Anesthesia Quick Evaluation

## 2013-01-08 NOTE — Anesthesia Postprocedure Evaluation (Signed)
  Anesthesia Post-op Note  Patient: Bonnie Juarez  Procedure(s) Performed: Procedure(s) with comments: OPEN REDUCTION INTERNAL FIXATION (ORIF) PROXIMAL HUMERUS FRACTURE (Right) - Open Reduction Internal Fixation Right Proximal Humerus Fracture  Patient Location: PACU  Anesthesia Type:General  Level of Consciousness: awake, alert  and oriented  Airway and Oxygen Therapy: Patient Spontanous Breathing and Patient connected to face mask oxygen  Post-op Pain: mild  Post-op Assessment: Post-op Vital signs reviewed  Post-op Vital Signs: Reviewed  Complications: No apparent anesthesia complications

## 2013-01-08 NOTE — Anesthesia Procedure Notes (Addendum)
Anesthesia Regional Block:  Supraclavicular block  Pre-Anesthetic Checklist: ,, timeout performed, Correct Patient, Correct Site, Correct Laterality, Correct Procedure, Correct Position, site marked, Risks and benefits discussed, Surgical consent,  At surgeon's request and post-op pain management  Laterality: Right and Upper  Prep: chloraprep       Needles:   Needle Type: Echogenic Needle      Needle Gauge: 22 and 22 G  Needle insertion depth: 5 cm   Additional Needles:  Procedures: ultrasound guided (picture in chart) Supraclavicular block Narrative:  Start time: 01/08/2013 1:50 PM End time: 01/08/2013 2:10 PM Injection made incrementally with aspirations every 5 mL.  Performed by: Personally  Anesthesiologist: T Massagee  Additional Notes: Monitors on, sedation begun, tolerated well   Procedure Name: Intubation Date/Time: 01/08/2013 4:36 PM Performed by: Brien Mates D Pre-anesthesia Checklist: Patient identified, Emergency Drugs available, Suction available, Patient being monitored and Timeout performed Patient Re-evaluated:Patient Re-evaluated prior to inductionOxygen Delivery Method: Circle system utilized Preoxygenation: Pre-oxygenation with 100% oxygen Intubation Type: IV induction Ventilation: Mask ventilation without difficulty Laryngoscope Size: Miller and 2 Grade View: Grade I Tube type: Oral Tube size: 7.5 mm Number of attempts: 1 Airway Equipment and Method: Stylet Placement Confirmation: ETT inserted through vocal cords under direct vision,  positive ETCO2 and breath sounds checked- equal and bilateral Secured at: 22 cm Tube secured with: Tape Dental Injury: Teeth and Oropharynx as per pre-operative assessment

## 2013-01-08 NOTE — H&P (Signed)
Bonnie Juarez is an 67 y.o. female.   Chief Complaint: fracture right humerus HPI: Pt fell at home injuring the right arm.  To ED where radiographs showed comminuted displaced fracture of the right proximal humerus.  She was treated with shoulder immobilizer and pain meds.  Seen and evaluated by Dr Ophelia Charter who has recommended ORIF of fracture.  She is admitted for ORIF of right proximal humerus fracture.  Past Medical History  Diagnosis Date  . Hypertension   . IBS (irritable bowel syndrome)     Hx: of  . Anxiety   . Depression   . Sleep apnea     Hx: of in the past  . GERD (gastroesophageal reflux disease)     Hx: of  . H/O hiatal hernia     PMH: of  . Headache(784.0)   . Fibromyalgia   . Cancer     Hx: of squamouscell carcinoma on Left shin basal cell on face  . Osteopenia     Hx: of per pt, pt. adds osteoporosis- DOS  . Complication of anesthesia     - 2006- after R arm surgery,pt. reports that "had a hard time getting me back" after using/being given Morphine , states she required sternal rubs & narcan  . PONV (postoperative nausea and vomiting)     Past Surgical History  Procedure Laterality Date  . Tubular plasity    . Cervical fusion    . Breast lumpectomy    . Arcuate keratectomy    . Radial head replacement    . Nicen fundo placation    . Hernia repair    . Fracture surgery    . Appendectomy      Family History  Problem Relation Age of Onset  . Heart disease Other   . Lung disease Other    Social History:  reports that she has quit smoking. Her smoking use included Cigarettes. She smoked 0.00 packs per day. She has never used smokeless tobacco. She reports that she drinks alcohol. She reports that she does not use illicit drugs.  Allergies: No Known Allergies  Medications Prior to Admission  Medication Sig Dispense Refill  . acetaminophen (TYLENOL) 500 MG tablet Take 500 mg by mouth every 6 (six) hours as needed for mild pain or headache (pain).       Marland Kitchen  BIOTIN 5000 PO Take 1 capsule by mouth every morning.      . cholecalciferol (VITAMIN D) 1000 UNITS tablet Take 1,000 Units by mouth every morning.      . conjugated estrogens (PREMARIN) vaginal cream Place 1 g vaginally daily as needed (dryness).      . fluticasone (FLONASE) 50 MCG/ACT nasal spray Place 2 sprays into the nose daily as needed for rhinitis.      Marland Kitchen LORazepam (ATIVAN) 0.5 MG tablet Take 0.5 mg by mouth every 8 (eight) hours. Anxiety      . losartan (COZAAR) 50 MG tablet Take 50 mg by mouth every morning.       Marland Kitchen omeprazole (PRILOSEC) 20 MG capsule Take 20 mg by mouth daily before breakfast.      . oxyCODONE-acetaminophen (PERCOCET) 5-325 MG per tablet Take 2 tablets by mouth every 4 (four) hours as needed.  20 tablet  0  . Oyster Shell (OYSTER CALCIUM) 500 MG TABS tablet Take 1,000 mg of elemental calcium by mouth every morning.      . predniSONE (DELTASONE) 1 MG tablet Take 2 mg by mouth daily.       Marland Kitchen  sertraline (ZOLOFT) 100 MG tablet Take 100 mg by mouth every morning.       . vitamin B-12 (CYANOCOBALAMIN) 1000 MCG tablet Take 2,000 mcg by mouth every morning.      . fexofenadine (ALLEGRA) 180 MG tablet Take 180 mg by mouth daily as needed for allergies.         Results for orders placed during the hospital encounter of 01/06/13 (from the past 48 hour(s))  SURGICAL PCR SCREEN     Status: Abnormal   Collection Time    01/06/13  1:55 PM      Result Value Range   MRSA, PCR NEGATIVE  NEGATIVE   Staphylococcus aureus POSITIVE (*) NEGATIVE   Comment:            The Xpert SA Assay (FDA     approved for NASAL specimens     in patients over 25 years of age),     is one component of     a comprehensive surveillance     program.  Test performance has     been validated by [employer] for patients greater     than or equal to 88 year old.     It is not intended     to diagnose infection nor to     guide or monitor treatment.  CBC     Status: Abnormal   Collection Time     01/06/13  1:56 PM      Result Value Range   WBC 15.3 (*) 4.0 - 10.5 K/uL   RBC 4.58  3.87 - 5.11 MIL/uL   Hemoglobin 13.3  12.0 - 15.0 g/dL   HCT 11.9  14.7 - 82.9 %   MCV 86.2  78.0 - 100.0 fL   MCH 29.0  26.0 - 34.0 pg   MCHC 33.7  30.0 - 36.0 g/dL   RDW 56.2  13.0 - 86.5 %   Platelets 253  150 - 400 K/uL  COMPREHENSIVE METABOLIC PANEL     Status: Abnormal   Collection Time    01/06/13  1:56 PM      Result Value Range   Sodium 137  135 - 145 mEq/L   Potassium 4.2  3.5 - 5.1 mEq/L   Chloride 101  96 - 112 mEq/L   CO2 25  19 - 32 mEq/L   Glucose, Bld 135 (*) 70 - 99 mg/dL   BUN 17  6 - 23 mg/dL   Creatinine, Ser 7.84  0.50 - 1.10 mg/dL   Calcium 9.3  8.4 - 69.6 mg/dL   Total Protein 7.5  6.0 - 8.3 g/dL   Albumin 3.9  3.5 - 5.2 g/dL   AST 18  0 - 37 U/L   ALT 15  0 - 35 U/L   Alkaline Phosphatase 68  39 - 117 U/L   Total Bilirubin 0.6  0.3 - 1.2 mg/dL   GFR calc non Af Amer 66 (*) >90 mL/min   GFR calc Af Amer 77 (*) >90 mL/min   Comment: (NOTE)     The eGFR has been calculated using the CKD EPI equation.     This calculation has not been validated in all clinical situations.     eGFR's persistently <90 mL/min signify possible Chronic Kidney     Disease.   Ct Shoulder Right Wo Contrast  01/06/2013   CLINICAL DATA:  Proximal right humerus fracture.  EXAM: CT OF THE RIGHT SHOULDER WITHOUT CONTRAST  TECHNIQUE: Multidetector CT imaging was performed according to the standard protocol. Multiplanar CT image reconstructions were also generated.  COMPARISON:  Radiographs dated 01/05/2013  FINDINGS: There is a minimally displaced fracture of the anterior aspect of the glenoid. This for fracture measures approximately 15 by 8 mm extends from approximately the 2 o'clock position to the 4 o'clock position.  There is a comminuted impacted fracture of the right humeral neck. There is a vertical sagittal fracture through the greater trochanter. The articular surface of the humeral head is  intact. No dislocation of the humeral head. There is slight angulation. The impaction is approximately 15 mm posteriorly.  No other acute osseous abnormalities.  IMPRESSION: Impacted angulated fracture of the proximal right humerus. Fracture of the anterior aspect of the glenoid.   Electronically Signed   By: Geanie Cooley M.D.   On: 01/06/2013 15:58    Review of Systems  Musculoskeletal: Positive for joint pain and neck pain.       Pain right arm  All other systems reviewed and are negative.    Blood pressure 146/92, pulse 65, temperature 97.8 F (36.6 C), temperature source Oral, resp. rate 18, SpO2 96.00%. Physical Exam  Constitutional: She is oriented to person, place, and time. She appears well-developed and well-nourished.  HENT:  Head: Normocephalic and atraumatic.  Eyes: EOM are normal.  Neck: Normal range of motion.  Cardiovascular: Normal rate.   Respiratory: Effort normal.  GI: Soft.  Musculoskeletal:  Distal pulse and sensation of right UE intact.  Unable to move arm secondary to pain.  Ecchymosis of upper right arm.  Neurological: She is alert and oriented to person, place, and time.  Skin: Skin is warm and dry.  Psychiatric: She has a normal mood and affect.     Assessment/Plan Displaced comminuted fracture of right proximal humerus.  PLAN:  Open reduction and internal fixation of right proximal humerus fracture.  Danner Paulding M 01/08/2013, 12:43 PM

## 2013-01-08 NOTE — Brief Op Note (Cosign Needed)
01/08/2013  5:28 PM  PATIENT:  Bonnie Juarez  67 y.o. female  PRE-OPERATIVE DIAGNOSIS:  Right proximal humerus fracture  POST-OPERATIVE DIAGNOSIS:  Right proximal humerus fracture  PROCEDURE:  Procedure(s) with comments: OPEN REDUCTION INTERNAL FIXATION (ORIF) PROXIMAL HUMERUS FRACTURE (Right) - Open Reduction Internal Fixation Right Proximal Humerus Fracture  SURGEON:  Surgeon(s) and Role:    * Eldred Manges, MD - Primary  PHYSICIAN ASSISTANT: Maurie Boettcher  ASSISTANTS: none   ANESTHESIA:   general  EBL:  Total I/O In: 1000 [I.V.:1000] Out: -   BLOOD ADMINISTERED:none  DRAINS: none   LOCAL MEDICATIONS USED:  NONE  SPECIMEN:  No Specimen  DISPOSITION OF SPECIMEN:  N/A  COUNTS:  YES  TOURNIQUET:  * No tourniquets in log *  DICTATION: .Note written in EPIC  PLAN OF CARE: Admit to inpatient   PATIENT DISPOSITION:  PACU - hemodynamically stable.   Delay start of Pharmacological VTE agent (>24hrs) due to surgical blood loss or risk of bleeding: yes

## 2013-01-09 ENCOUNTER — Encounter (HOSPITAL_COMMUNITY): Payer: Self-pay | Admitting: *Deleted

## 2013-01-09 MED ORDER — CHLORHEXIDINE GLUCONATE CLOTH 2 % EX PADS
6.0000 | MEDICATED_PAD | Freq: Every day | CUTANEOUS | Status: DC
  Administered 2013-01-09 – 2013-01-10 (×2): 6 via TOPICAL

## 2013-01-09 MED ORDER — MUPIROCIN 2 % EX OINT
1.0000 "application " | TOPICAL_OINTMENT | Freq: Two times a day (BID) | CUTANEOUS | Status: DC
  Administered 2013-01-09 – 2013-01-10 (×3): 1 via NASAL
  Filled 2013-01-09: qty 22

## 2013-01-09 MED ORDER — INFLUENZA VAC SPLIT QUAD 0.5 ML IM SUSP
0.5000 mL | INTRAMUSCULAR | Status: AC
  Administered 2013-01-10: 0.5 mL via INTRAMUSCULAR
  Filled 2013-01-09: qty 0.5

## 2013-01-09 NOTE — Evaluation (Addendum)
Occupational Therapy Evaluation Patient Details Name: Bonnie Juarez MRN: [id number] DOB: Jan 08, 1946 Today's Date: 01/09/2013 Time: 2536-6440 OT Time Calculation (min): 19 min  OT Assessment / Plan / Recommendation History of present illness Pt is a 67 y/o female, s/p fracture, ORIF Right proximal humerus after falling at home. DOS: 01/08/13.   Clinical Impression   Pt presents w/ dx as above impacting her ability to independently perform ADL's. Pt reports that she has 24/assist from her husband and son. She reports no concerns at this time related to OT/ADL's as she has had previous elbow surgery. Pt states that she plans to f/u w/ MD for progression of rehab related to her RUE/shoulder. She is a good candidate for out-pt treatment/hand therapy. She was educated in AROM digits (tendon gliding, composite fisting/extension, opposition & edema control techniques) & verbalized understanding.    OT Assessment  Progress rehab of shoulder/Right UE as ordered by MD at follow-up appointment    Follow Up Recommendations  No OT follow up;Supervision - Intermittent    Barriers to Discharge      Equipment Recommendations  None recommended by OT    Recommendations for Other Services    Frequency       Precautions / Restrictions Precautions Precautions: Fall Required Braces or Orthoses: Sling (Sling/Shoulder immobilizer right) Restrictions Weight Bearing Restrictions: No   Pertinent Vitals/Pain No c/o     ADL  Eating/Feeding: Performed;Modified independent Where Assessed - Eating/Feeding: Chair Grooming: Performed;Wash/dry hands;Supervision/safety Where Assessed - Grooming: Unsupported standing Upper Body Bathing: Simulated;Minimal assistance Where Assessed - Upper Body Bathing: Unsupported sitting Lower Body Bathing: Simulated;Minimal assistance Where Assessed - Lower Body Bathing: Supported sit to stand Upper Body Dressing: Performed;Minimal assistance Where Assessed - Upper Body  Dressing: Unsupported sitting Lower Body Dressing: Simulated;Minimal assistance Where Assessed - Lower Body Dressing: Supported sit to Pharmacist, hospital: Research scientist (life sciences) Method: Sit to Barista: Comfort height toilet Toileting - Architect and Hygiene: Performed;Modified independent;Supervision/safety Where Assessed - Toileting Clothing Manipulation and Hygiene: Sit to stand from 3-in-1 or toilet;Standing Tub/Shower Transfer: Simulated;Supervision/safety Tub/Shower Transfer Method: Science writer: Walk in shower;Other (comment) (Simulated step up 2-3 inch step) Equipment Used: Other (comment) (Right sling/Shoulder immobilizer) Transfers/Ambulation Related to ADLs: Pt is currently Mod I - supervision level assist for functional mobility and trasnfers in room, on/off comfort height toilet & in hall. Supervision level transfer for shower. ADL Comments: Pt reports that she has had previous R UE elbow surgery and states that she has no needs for ADL training as husband & son w/ assist PRN at d/c. She has DME and plans to f/u w/ MD re: progression of Rehabilitation as per MD orders. Note, pt is good candidate for out-pt treatment. Pt was educted in role of OT as well as tendon gliding, opposition, digital extension of right digits, as well as edema control. Pt was Mod I performing exercises as described.    OT Diagnosis:    OT Problem List:   OT Treatment Interventions:     OT Goals(Current goals can be found in the care plan section) Acute Rehab OT Goals Patient Stated Goal: Home w/ husband/son assistance  Visit Information  Last OT Received On: 01/09/13 Assistance Needed: +1 History of Present Illness: Pt is a 67 y/o female, s/p fracture, ORIF Right proximal humerus after falling at home. DOS: 01/08/13.       Prior Functioning     Home Living Family/patient expects to be discharged to:: Private  residence Living  Arrangements: Spouse/significant other Available Help at Discharge: Family Type of Home: House Home Layout: One level Home Equipment: Bedside commode Prior Function Level of Independence: Independent Communication Communication: No difficulties    Vision/Perception Vision - Assessment Vision Assessment: Vision not tested   Cognition  Cognition Arousal/Alertness: Awake/alert Behavior During Therapy: WFL for tasks assessed/performed Overall Cognitive Status: Within Functional Limits for tasks assessed    Extremity/Trunk Assessment Upper Extremity Assessment Upper Extremity Assessment: RUE deficits/detail RUE Deficits / Details: R proximal humerus fracture s/p ORIF; Sling/Shoulder Immobilizer RUE: Unable to fully assess due to immobilization Lower Extremity Assessment Lower Extremity Assessment: Overall WFL for tasks assessed Cervical / Trunk Assessment Cervical / Trunk Assessment: Normal    Mobility Bed Mobility Bed Mobility: Not assessed Details for Bed Mobility Assistance: Pt up in chair Transfers Transfers: Sit to Stand;Stand to Sit Sit to Stand: 6: Modified independent (Device/Increase time);5: Supervision;From chair/3-in-1;From toilet Stand to Sit: 6: Modified independent (Device/Increase time);5: Supervision;To chair/3-in-1;To toilet Details for Transfer Assistance: Pt demonstrated safe technique for transfers    Exercise General Exercises - Upper Extremity Wrist Flexion: AROM;Right;15 reps Wrist Extension: AROM;Right;15 reps Digit Composite Flexion: AROM;Right;15 reps;Seated Composite Extension: AROM;Right;15 reps;Seated Hand Exercises Opposition: AROM;Right;15 reps;Seated Other Exercises Other Exercises: Pt educated in edema control tech's verbally   Balance Balance Balance Assessed: Yes Static Sitting Balance Static Sitting - Balance Support: No upper extremity supported;Feet supported;Feet unsupported Dynamic Sitting Balance Dynamic  Sitting - Balance Support: No upper extremity supported Static Standing Balance Static Standing - Balance Support: No upper extremity supported;During functional activity   End of Session OT - End of Session Equipment Utilized During Treatment: Other (comment) (R sling/shoulder immobilizer) Activity Tolerance: Patient tolerated treatment well Patient left: in chair;with call bell/phone within reach;with family/visitor present  GO     Roselie Awkward Dixon 01/09/2013, 11:25 AM

## 2013-01-09 NOTE — Progress Notes (Addendum)
Subjective: 1 Day Post-Op Procedure(s) (LRB): OPEN REDUCTION INTERNAL FIXATION (ORIF) PROXIMAL HUMERUS FRACTURE (Right) Patient reports pain as mild.    Objective: Vital signs in last 24 hours: Temp:  [97.1 F (36.2 C)-98.8 F (37.1 C)] 97.8 F (36.6 C) (11/13 0525) Pulse Rate:  [65-84] 84 (11/13 0525) Resp:  [15-21] 20 (11/13 0525) BP: (135-172)/(53-92) 135/62 mmHg (11/13 0525) SpO2:  [93 %-100 %] 98 % (11/13 0525) FiO2 (%):  [2 %] 2 % (11/12 2014) Weight:  [84.369 kg (186 lb)] 84.369 kg (186 lb) (11/13 0744)  Intake/Output from previous day: 11/12 0701 - 11/13 0700 In: 1400 [I.V.:1400] Out: 75 [Blood:75] Intake/Output this shift:     Recent Labs  01/06/13 1356  HGB 13.3    Recent Labs  01/06/13 1356  WBC 15.3*  RBC 4.58  HCT 39.5  PLT 253    Recent Labs  01/06/13 1356  NA 137  K 4.2  CL 101  CO2 25  BUN 17  CREATININE 0.87  GLUCOSE 135*  CALCIUM 9.3   No results found for this basename: LABPT, INR,  in the last 72 hours  Neurologically intact  Assessment/Plan: 1 Day Post-Op Procedure(s) (LRB): OPEN REDUCTION INTERNAL FIXATION (ORIF) PROXIMAL HUMERUS FRACTURE (Right) Up with therapy  ,Stellate  block still working.  Plan discharge Friday.  Juliett Eastburn C 01/09/2013, 8:06 AM

## 2013-01-10 NOTE — Op Note (Signed)
Bonnie Juarez, Bonnie Juarez                  ACCOUNT NO.:  [id number]  MEDICAL RECORD NO.:  [id number]  LOCATION:  6N21C                        FACILITY:  [facility]  PHYSICIAN:  Adrianna Dudas C. Ophelia Charter, M.D.    DATE OF BIRTH:  1945/05/14  DATE OF PROCEDURE:  01/08/2013 DATE OF DISCHARGE:                              OPERATIVE REPORT   PREOPERATIVE DIAGNOSIS:  Comminuted right proximal humerus fracture.  POSTOPERATIVE DIAGNOSIS:  Comminuted right proximal humerus fracture.  PROCEDURE:  ORIF, right proximal humerus.  SURGEON:  Kylor Valverde C. Ophelia Charter, MD  ASSISTANT:  Maud Deed, PA-C, medically necessary and present for the entire procedure.  ANESTHESIA:  General anesthesia plus preoperative scalene block.  EBL:  100 mL.  DRAINS:  None.  IMPLANTS:  Biomet anatomic proximal humerus plate with 2 distal screws, proximal smooth pegs.  DESCRIPTION OF PROCEDURE:  After induction of general anesthesia, 2 g Ancef prophylaxis, the patient has been on prednisone for years on a gradual taper and Solu-Cortef 100 mg was given.  Time-out procedure was completed after prepping and draping with DuraPrep, usual split sheets, drapes, impervious stockinette, Coban, sterile skin marker. Deltopectoral incision and Betadine, Steri-Drape sealing the skin. After time out, incision was made.  There was no definite identifiable cephalic vein, deltopectoral interval was identified.  Small amount of fat in the interval.  Biceps tendon was identified and lateral biceps tendon fracture fragment was identified with distraction fracture was reduced putting the finger back behind and the narrow Bennett retractor was used over the greater tuberosity to help hold it down in reduction and a plate was applied.  K-wire was placed.  Plate was slightly proximal and a slotted hole was used to push the plate more distal down in appropriate position after removing the proximal pin and then re- pinning.  Proximal holes were then drilled,  measured, drill sleeve removed and then filled with smooth pegs, varying between 30-42 mm.  One posterior-superior peg came just below the articular surface, the head was gradually rotated, flexed, extended, internally and externally rotated to make sure that it would piece enough close to the joint surface and a picture was taken when it was closest but then never penetrated to the joint surface.  All other smooth pegs were in good position.  Good reduction of the fracture.  Greater tuberosity was in satisfactory position.  This was 3-part fracture and the plate to help hold the fragment catching with the pegs.  Shoulder was taken through range of motion, checked under fluoroscopy was stable.  Wound was irrigated.  The subcutaneous tissue reapproximated with 2-0 Vicryl, 4-0 Vicryl subcuticular closure.  Tincture of benzoin, Steri-Strips, postop dressing, and re-application of a shoulder immobilizer.  The patient was transferred to recovery room in stable condition.  Procedure was as posted implant.  Instrument count and needle count were correct.     Teoman Giraud C. Ophelia Charter, M.D.     MCY/MEDQ  D:  01/08/2013  T:  01/09/2013  Job:  [id number]

## 2013-01-10 NOTE — Progress Notes (Signed)
DC instructions reviewed with patient and spouse, verbalized understanding.  Rx given for Robaxin and pain medication.  Care of incision reviewed with patient and spouse, verbalized understanding.  Patient transported to front of hospital via wheelchair accompanied by volunteer and spouse.  Patient in good condition at times of discharge.

## 2013-01-10 NOTE — Progress Notes (Signed)
Subjective: 2 Days Post-Op Procedure(s) (LRB): OPEN REDUCTION INTERNAL FIXATION (ORIF) PROXIMAL HUMERUS FRACTURE (Right) Patient reports pain as mild.    Objective: Vital signs in last 24 hours: Temp:  [97.5 F (36.4 Juarez)-98.6 F (37 Juarez)] 98.4 F (36.9 Juarez) (11/14 0620) Pulse Rate:  [61-79] 61 (11/14 0620) Resp:  [18-20] 18 (11/14 0620) BP: (109-156)/(52-94) 152/68 mmHg (11/14 0620) SpO2:  [96 %-98 %] 98 % (11/14 0620)  Intake/Output from previous day: 11/13 0701 - 11/14 0700 In: 2383.8 [I.V.:2383.8] Out: -  Intake/Output this shift:    No results found for this basename: HGB,  in the last 72 hours No results found for this basename: WBC, RBC, HCT, PLT,  in the last 72 hours No results found for this basename: NA, K, CL, CO2, BUN, CREATININE, GLUCOSE, CALCIUM,  in the last 72 hours No results found for this basename: LABPT, INR,  in the last 72 hours  Neurologically intact  Assessment/Plan: 2 Days Post-Op Procedure(s) (LRB): OPEN REDUCTION INTERNAL FIXATION (ORIF) PROXIMAL HUMERUS FRACTURE (Right) Discharge Home.   Bonnie Juarez 01/10/2013, 8:16 AM

## 2013-01-13 ENCOUNTER — Encounter (HOSPITAL_COMMUNITY): Payer: Self-pay | Admitting: Orthopaedic Surgery

## 2013-01-20 NOTE — Discharge Summary (Signed)
Physician Discharge Summary  Patient ID: Bonnie Juarez MRN: [id number] DOB/AGE: 04/20/1945 67 y.o.  Admit date: 01/08/2013 Discharge date: 01/10/2013  Admission Diagnoses:  Closed fracture of right proximal humerus  Discharge Diagnoses:  Principal Problem:   Closed fracture of right proximal humerus   Past Medical History  Diagnosis Date  . Hypertension   . IBS (irritable bowel syndrome)     Hx: of  . Anxiety   . Depression   . Sleep apnea     Hx: of in the past  . GERD (gastroesophageal reflux disease)     Hx: of  . H/O hiatal hernia     PMH: of  . Headache(784.0)   . Fibromyalgia   . Cancer     Hx: of squamouscell carcinoma on Left shin basal cell on face  . Osteopenia     Hx: of per pt, pt. adds osteoporosis- DOS  . Complication of anesthesia     - 2006- after R arm surgery,pt. reports that "had a hard time getting me back" after using/being given Morphine , states she required sternal rubs & narcan  . PONV (postoperative nausea and vomiting)     Surgeries: Procedure(s): OPEN REDUCTION INTERNAL FIXATION (ORIF) PROXIMAL HUMERUS FRACTURE on 01/08/2013   Consultants (if any):   NONE  Discharged Condition: Improved  Hospital Course: ARTESHA WEMHOFF is an 67 y.o. female who was admitted 01/08/2013 with a diagnosis of Closed fracture of right proximal humerus and went to the operating room on 01/08/2013 and underwent the above named procedures.    She was given perioperative antibiotics:  Anti-infectives   Start     Dose/Rate Route Frequency Ordered Stop   01/08/13 2200  ceFAZolin (ANCEF) IVPB 1 g/50 mL premix     1 g 100 mL/hr over 30 Minutes Intravenous Every 6 hours 01/08/13 2012 01/09/13 1047   01/08/13 0600  ceFAZolin (ANCEF) IVPB 2 g/50 mL premix     2 g 100 mL/hr over 30 Minutes Intravenous On call to O.R. 01/07/13 1435 01/08/13 1633    .  She was given sequential compression devices, early ambulation for DVT prophylaxis.  She benefited maximally from the  hospital stay and there were no complications.    Recent vital signs:  Filed Vitals:   01/10/13 0620  BP: 152/68  Pulse: 61  Temp: 98.4 F (36.9 C)  Resp: 18    Recent laboratory studies:  Lab Results  Component Value Date   HGB 13.3 01/06/2013   HGB 13.0 09/04/2012   HGB 12.2 10/22/2008   Lab Results  Component Value Date   WBC 15.3* 01/06/2013   PLT 253 01/06/2013   No results found for this basename: INR   Lab Results  Component Value Date   NA 137 01/06/2013   K 4.2 01/06/2013   CL 101 01/06/2013   CO2 25 01/06/2013   BUN 17 01/06/2013   CREATININE 0.87 01/06/2013   GLUCOSE 135* 01/06/2013    Discharge Medications:     Medication List         acetaminophen 500 MG tablet  Commonly known as:  TYLENOL  Take 500 mg by mouth every 6 (six) hours as needed for mild pain or headache (pain).     BIOTIN 5000 PO  Take 1 capsule by mouth every morning.     cholecalciferol 1000 UNITS tablet  Commonly known as:  VITAMIN D  Take 1,000 Units by mouth every morning.     conjugated estrogens vaginal cream  Commonly known as:  PREMARIN  Place 1 g vaginally daily as needed (dryness).     fexofenadine 180 MG tablet  Commonly known as:  ALLEGRA  Take 180 mg by mouth daily as needed for allergies.     fluticasone 50 MCG/ACT nasal spray  Commonly known as:  FLONASE  Place 2 sprays into the nose daily as needed for rhinitis.     LORazepam 0.5 MG tablet  Commonly known as:  ATIVAN  Take 0.5 mg by mouth every 8 (eight) hours. Anxiety     losartan 50 MG tablet  Commonly known as:  COZAAR  Take 50 mg by mouth every morning.     methocarbamol 500 MG tablet  Commonly known as:  ROBAXIN  Take 1 tablet (500 mg total) by mouth every 6 (six) hours as needed for muscle spasms (spasm).     omeprazole 20 MG capsule  Commonly known as:  PRILOSEC  Take 20 mg by mouth daily before breakfast.     oxyCODONE-acetaminophen 5-325 MG per tablet  Commonly known as:  PERCOCET  Take  2 tablets by mouth every 4 (four) hours as needed.     oxyCODONE-acetaminophen 5-325 MG per tablet  Commonly known as:  ROXICET  Take 1 tablet by mouth every 4 (four) hours as needed.     oyster calcium 500 MG Tabs tablet  Take 1,000 mg of elemental calcium by mouth every morning.     predniSONE 1 MG tablet  Commonly known as:  DELTASONE  Take 2 mg by mouth daily.     sertraline 100 MG tablet  Commonly known as:  ZOLOFT  Take 100 mg by mouth every morning.     vitamin B-12 1000 MCG tablet  Commonly known as:  CYANOCOBALAMIN  Take 2,000 mcg by mouth every morning.        Diagnostic Studies: Dg Shoulder Right  01/05/2013   CLINICAL DATA:  Right shoulder pain following a fall today.  EXAM: RIGHT SHOULDER - 2+ VIEW  COMPARISON:  11/04/2003.  FINDINGS: Interval impacted fracture involving the right humeral neck, proximal metaphysis and greater tuberosity. Posterior angulation and anterior displacement of the distal fragment.  IMPRESSION: Comminuted and impacted proximal right humerus fracture, as described above.   Electronically Signed   By: Gordan Payment M.D.   On: 01/05/2013 21:43   Dg Elbow Complete Right  01/05/2013   CLINICAL DATA:  Right elbow pain following a fall today.  EXAM: RIGHT ELBOW - COMPLETE 3+ VIEW  COMPARISON:  11/04/2003.  FINDINGS: Interval radial head prosthesis. Posterior olecranon and distal humerus spur formation. No acute fracture, dislocation or effusion seen.  IMPRESSION: No acute fracture, dislocation or effusion.   Electronically Signed   By: Gordan Payment M.D.   On: 01/05/2013 21:45   Dg Wrist Complete Right  01/05/2013   CLINICAL DATA:  Right wrist pain following a fall today.  EXAM: RIGHT WRIST - COMPLETE 3+ VIEW  COMPARISON:  11/04/2003.  FINDINGS: Degenerative changes at the articulation of the 1st metacarpal and trapezium. No fracture or dislocation.  IMPRESSION: No fracture.   Electronically Signed   By: Gordan Payment M.D.   On: 01/05/2013 22:55   Ct  Cervical Spine Wo Contrast  01/05/2013   CLINICAL DATA:  Neck pain following a fall today.  EXAM: CT CERVICAL SPINE WITHOUT CONTRAST  TECHNIQUE: Multidetector CT imaging of the cervical spine was performed without intravenous contrast. Multiplanar CT image reconstructions were also generated.  COMPARISON:  Cervical spine MR  dated 11/09/2009.  FINDINGS: Fusion of the bodies of the C5 and C6 vertebrae. Degenerative changes at the remaining levels of the cervical spine. Reversal of the normal cervical lordosis. 2 mm of anterolisthesis at the C2-3 level, without significant change. 3 mm of anterolisthesis at the C7-T1 level, also without significant change. No prevertebral soft tissue swelling, fractures or acute subluxations.  IMPRESSION: 1. Postoperative and degenerative changes, as described above. 2. No fracture or acute subluxation.   Electronically Signed   By: Gordan Payment M.D.   On: 01/05/2013 22:53   Ct Shoulder Right Wo Contrast  01/06/2013   CLINICAL DATA:  Proximal right humerus fracture.  EXAM: CT OF THE RIGHT SHOULDER WITHOUT CONTRAST  TECHNIQUE: Multidetector CT imaging was performed according to the standard protocol. Multiplanar CT image reconstructions were also generated.  COMPARISON:  Radiographs dated 01/05/2013  FINDINGS: There is a minimally displaced fracture of the anterior aspect of the glenoid. This for fracture measures approximately 15 by 8 mm extends from approximately the 2 o'clock position to the 4 o'clock position.  There is a comminuted impacted fracture of the right humeral neck. There is a vertical sagittal fracture through the greater trochanter. The articular surface of the humeral head is intact. No dislocation of the humeral head. There is slight angulation. The impaction is approximately 15 mm posteriorly.  No other acute osseous abnormalities.  IMPRESSION: Impacted angulated fracture of the proximal right humerus. Fracture of the anterior aspect of the glenoid.    Electronically Signed   By: Geanie Cooley M.D.   On: 01/06/2013 15:58   Dg Humerus Right  01/08/2013   CLINICAL DATA:  Shoulder fracture fixation.  EXAM: DG C-ARM 1-60 MIN; RIGHT HUMERUS - 2+ VIEW  COMPARISON:  01/05/2013  FINDINGS: Side plate and multiple screws are transfixing the proximal humeral head and neck fractures. Good position and alignment.  IMPRESSION: Internal fixation of comminuted head and neck fractures.   Electronically Signed   By: Loralie Champagne M.D.   On: 01/08/2013 19:40   Dg C-arm 1-60 Min  01/08/2013   CLINICAL DATA:  Shoulder fracture fixation.  EXAM: DG C-ARM 1-60 MIN; RIGHT HUMERUS - 2+ VIEW  COMPARISON:  01/05/2013  FINDINGS: Side plate and multiple screws are transfixing the proximal humeral head and neck fractures. Good position and alignment.  IMPRESSION: Internal fixation of comminuted head and neck fractures.   Electronically Signed   By: Loralie Champagne M.D.   On: 01/08/2013 19:40    Disposition: 01-Home or Self Care      Discharge Orders   Future Orders Complete By Expires   Call MD / Call 911  As directed    Comments:     If you experience chest pain or shortness of breath, CALL 911 and be transported to the hospital emergency room.  If you develope a fever above 101 F, pus (white drainage) or increased drainage or redness at the wound, or calf pain, call your surgeon's office.   Constipation Prevention  As directed    Comments:     Drink plenty of fluids.  Prune juice may be helpful.  You may use a stool softener, such as Colace (over the counter) 100 mg twice a day.  Use MiraLax (over the counter) for constipation as needed.   Diet - low sodium heart healthy  As directed    Discharge instructions  As directed    Comments:     No use of right arm.  Sling at all times.  Ice as needed for pain and swelling. Change dressing daily or as needed.   Increase activity slowly as tolerated  As directed       Follow-up Information   Follow up with YATES,MARK  C, MD. Schedule an appointment as soon as possible for a visit in 2 weeks.   Specialty:  Orthopedic Surgery   Contact information:   [address] [place] [address] [phone]        Signed: Wende Neighbors 01/20/2013, 3:49 PM

## 2013-02-27 HISTORY — PX: KNEE ARTHROSCOPY: SUR90

## 2013-08-12 ENCOUNTER — Other Ambulatory Visit: Payer: Self-pay | Admitting: Family Medicine

## 2013-08-12 ENCOUNTER — Other Ambulatory Visit (HOSPITAL_COMMUNITY)
Admission: RE | Admit: 2013-08-12 | Discharge: 2013-08-12 | Disposition: A | Discharge status: Home / Self Care | Payer: Medicare HMO | Source: Ambulatory Visit | Attending: Family Medicine | Admitting: Family Medicine

## 2013-08-12 DIAGNOSIS — Z1151 Encounter for screening for human papillomavirus (HPV): Secondary | ICD-10-CM | POA: Insufficient documentation

## 2013-08-12 DIAGNOSIS — Z124 Encounter for screening for malignant neoplasm of cervix: Secondary | ICD-10-CM | POA: Insufficient documentation

## 2013-08-13 LAB — CYTOLOGY - PAP

## 2013-10-28 ENCOUNTER — Other Ambulatory Visit: Payer: Self-pay

## 2013-10-28 DIAGNOSIS — Z1231 Encounter for screening mammogram for malignant neoplasm of breast: Secondary | ICD-10-CM

## 2013-11-13 ENCOUNTER — Ambulatory Visit: Payer: Medicare Other

## 2013-11-25 ENCOUNTER — Ambulatory Visit: Payer: Medicare Other

## 2013-12-01 ENCOUNTER — Ambulatory Visit
Admission: RE | Admit: 2013-12-01 | Discharge: 2013-12-01 | Disposition: A | Discharge status: Home / Self Care | Payer: Commercial Managed Care - HMO | Source: Ambulatory Visit

## 2013-12-01 DIAGNOSIS — Z1231 Encounter for screening mammogram for malignant neoplasm of breast: Secondary | ICD-10-CM

## 2014-06-19 ENCOUNTER — Ambulatory Visit (INDEPENDENT_AMBULATORY_CARE_PROVIDER_SITE_OTHER): Payer: PPO

## 2014-06-19 ENCOUNTER — Ambulatory Visit (INDEPENDENT_AMBULATORY_CARE_PROVIDER_SITE_OTHER): Payer: PPO | Admitting: Podiatrist

## 2014-06-19 ENCOUNTER — Encounter: Payer: Self-pay | Admitting: Podiatrist

## 2014-06-19 VITALS — BP 122/69 | HR 74 | Resp 12

## 2014-06-19 DIAGNOSIS — R52 Pain, unspecified: Secondary | ICD-10-CM

## 2014-06-19 DIAGNOSIS — M79673 Pain in unspecified foot: Secondary | ICD-10-CM

## 2014-06-19 DIAGNOSIS — M779 Enthesopathy, unspecified: Secondary | ICD-10-CM

## 2014-06-19 DIAGNOSIS — I7300 Raynaud's syndrome without gangrene: Secondary | ICD-10-CM | POA: Diagnosis not present

## 2014-06-19 NOTE — Progress Notes (Signed)
   Subjective:    Patient ID: Bonnie Juarez, female    DOB: 12/31/1945, 69 y.o.   MRN: [id number]  HPI  PT STATED RT FOOT BALL OF THE FOOT AND ARCH IS BEEN SORE FOR 3 MONTHS. THE FOOT IS A LITTLE BETTER BUT WHEN WEARING HIGH ARCH SHOES IT GET WORSE. TRIED NO TREATMENT.  ALSO, B/L FEET TURNING PURPLE.  Review of Systems  Musculoskeletal: Positive for back pain and joint swelling.  All other systems reviewed and are negative.      Objective:   Physical Exam GENERAL APPEARANCE: Alert, conversant. Appropriately groomed. No acute distress.  VASCULAR: Pedal pulses palpable at 1/4 DP and PT bilateral.  Capillary refill time is immediate to all digits,  Proximal to distal cooling is cool to cool.   Digital hair growth is decreased bilateral. Skin color takes on a purplish discoloration with sitting .  Left foot is more noticeable than the right.  No skin color changes noted or reported.  NEUROLOGIC: sensation is intact epicritically and protectively to 5.07 monofilament at 5/5 sites bilateral.  Light touch is intact bilateral, vibratory sensation intact bilateral, achilles tendon reflex is intact bilateral.  MUSCULOSKELETAL: acceptable muscle strength, tone and stability bilateral. Thin foot type is noted with pain at the insertion of the pt tendon and along the plantar arch. No pain at insertion of plantar fascia.  Bilateral finding DERMATOLOGIC: skin, texture, and turger are within normal limits.  No preulcerative lesions are seen, no interdigital maceration noted.  No open lesions present.  Digital nails are asymptomatic.      Assessment & Plan:  Possible raynauds ,  High arches with tendonitis bilateral  Plan:  Recommended an orthotic-- a soft insert to sulcus length recommended and she was scanned to day.  Will order a test at [facility] for raynauds.

## 2014-06-19 NOTE — Patient Instructions (Signed)
Raynaud's Syndrome Raynaud's Syndrome is a disorder of the blood vessels in your hands and feet. It occurs when small arteries of the arms/hands or legs/feet become sensitive to cold or emotional upset. This causes the arteries to constrict, or narrow, and reduces blood flow to the area. The color in the fingers or toes changes from white to bluish to red and this is not usually painful. There may be numbness and tingling. Sores on the skin (ulcers) can form. Symptoms are usually relieved by warming. HOME CARE INSTRUCTIONS   Avoid exposure to cold. Keep your whole body warm and dry. Dress in layers. Wear mittens or gloves when handling ice or frozen food and when outdoors. Use holders for glasses or cans containing cold drinks. If possible, stay indoors during cold weather.  Limit your use of caffeine. Switch to decaffeinated coffee, tea, and soda pop. Avoid chocolate.  Avoid smoking or being around cigarette smoke. Smoke will make symptoms worse.  Wear loose fitting socks and comfortable, roomy shoes.  Avoid vibrating tools and machinery.  If possible, avoid stressful and emotional situations. Exercise, meditation and yoga may help you cope with stress. Biofeedback may be useful.  Ask your caregiver about medicine (calcium channel blockers) that may control Raynaud's phenomena. SEEK MEDICAL CARE IF:   Your discomfort becomes worse, despite conservative treatment.  You develop sores on your fingers and toes that do not heal. Document Released: 02/11/2000 Document Revised: 05/08/2011 Document Reviewed: 02/18/2008 [employer] Patient Information 2015 [employer]. This information is not intended to replace advice given to you by your health care provider. Make sure you discuss any questions you have with your health care provider.  

## 2014-06-25 ENCOUNTER — Other Ambulatory Visit: Payer: Self-pay | Admitting: Podiatrist

## 2014-06-25 DIAGNOSIS — M79606 Pain in leg, unspecified: Secondary | ICD-10-CM

## 2014-07-02 ENCOUNTER — Encounter (HOSPITAL_COMMUNITY): Payer: Commercial Managed Care - HMO

## 2014-07-02 ENCOUNTER — Ambulatory Visit (HOSPITAL_COMMUNITY)
Admission: RE | Admit: 2014-07-02 | Discharge: 2014-07-02 | Disposition: A | Discharge status: Home / Self Care | Payer: PPO | Source: Ambulatory Visit | Attending: Cardiology | Admitting: Cardiology

## 2014-07-02 DIAGNOSIS — M79606 Pain in leg, unspecified: Secondary | ICD-10-CM | POA: Diagnosis not present

## 2014-07-21 ENCOUNTER — Ambulatory Visit: Payer: PPO | Admitting: *Deleted

## 2014-07-21 DIAGNOSIS — M779 Enthesopathy, unspecified: Secondary | ICD-10-CM

## 2014-07-21 NOTE — Patient Instructions (Signed)

## 2014-07-21 NOTE — Progress Notes (Signed)
Patient ID: Bonnie Juarez, female   DOB: Apr 21, 1945, 69 y.o.   MRN: [id number] PICKING UP INSERTS

## 2014-07-29 ENCOUNTER — Telehealth: Payer: Self-pay | Admitting: *Deleted

## 2014-07-29 NOTE — Telephone Encounter (Addendum)
Pt request results of her dopplers.  I will give the results to Dr. Paulla Dolly for review.  Dr. Paulla Dolly states the Dopplers were within normal limits, and pt was informed.

## 2014-07-30 NOTE — Telephone Encounter (Signed)
Let me see the results

## 2014-08-24 ENCOUNTER — Other Ambulatory Visit: Payer: Self-pay

## 2014-08-26 ENCOUNTER — Other Ambulatory Visit (HOSPITAL_COMMUNITY)
Admission: RE | Admit: 2014-08-26 | Discharge: 2014-08-26 | Disposition: A | Discharge status: Home / Self Care | Payer: PPO | Source: Ambulatory Visit | Attending: Family Medicine | Admitting: Family Medicine

## 2014-08-26 ENCOUNTER — Other Ambulatory Visit: Payer: Self-pay | Admitting: Family Medicine

## 2014-08-26 DIAGNOSIS — Z01411 Encounter for gynecological examination (general) (routine) with abnormal findings: Secondary | ICD-10-CM | POA: Insufficient documentation

## 2014-09-01 LAB — CYTOLOGY - PAP

## 2015-01-25 ENCOUNTER — Other Ambulatory Visit: Payer: Self-pay

## 2015-01-25 DIAGNOSIS — Z1231 Encounter for screening mammogram for malignant neoplasm of breast: Secondary | ICD-10-CM

## 2015-01-26 ENCOUNTER — Ambulatory Visit
Admission: RE | Admit: 2015-01-26 | Discharge: 2015-01-26 | Disposition: A | Discharge status: Home / Self Care | Payer: PPO | Source: Ambulatory Visit

## 2015-01-26 DIAGNOSIS — Z1231 Encounter for screening mammogram for malignant neoplasm of breast: Secondary | ICD-10-CM

## 2015-03-02 DIAGNOSIS — R350 Frequency of micturition: Secondary | ICD-10-CM | POA: Diagnosis not present

## 2015-03-02 DIAGNOSIS — F3342 Major depressive disorder, recurrent, in full remission: Secondary | ICD-10-CM | POA: Diagnosis not present

## 2015-03-02 DIAGNOSIS — I10 Essential (primary) hypertension: Secondary | ICD-10-CM | POA: Diagnosis not present

## 2015-03-02 DIAGNOSIS — Z0000 Encounter for general adult medical examination without abnormal findings: Secondary | ICD-10-CM | POA: Diagnosis not present

## 2015-03-02 DIAGNOSIS — Z23 Encounter for immunization: Secondary | ICD-10-CM | POA: Diagnosis not present

## 2015-03-02 DIAGNOSIS — J309 Allergic rhinitis, unspecified: Secondary | ICD-10-CM | POA: Diagnosis not present

## 2015-03-02 DIAGNOSIS — Z791 Long term (current) use of non-steroidal anti-inflammatories (NSAID): Secondary | ICD-10-CM | POA: Diagnosis not present

## 2015-03-02 DIAGNOSIS — M797 Fibromyalgia: Secondary | ICD-10-CM | POA: Diagnosis not present

## 2015-03-02 DIAGNOSIS — F419 Anxiety disorder, unspecified: Secondary | ICD-10-CM | POA: Diagnosis not present

## 2015-03-02 DIAGNOSIS — G4733 Obstructive sleep apnea (adult) (pediatric): Secondary | ICD-10-CM | POA: Diagnosis not present

## 2015-03-02 DIAGNOSIS — K219 Gastro-esophageal reflux disease without esophagitis: Secondary | ICD-10-CM | POA: Diagnosis not present

## 2015-03-02 DIAGNOSIS — E782 Mixed hyperlipidemia: Secondary | ICD-10-CM | POA: Diagnosis not present

## 2015-03-05 DIAGNOSIS — J309 Allergic rhinitis, unspecified: Secondary | ICD-10-CM | POA: Diagnosis not present

## 2015-03-05 DIAGNOSIS — F419 Anxiety disorder, unspecified: Secondary | ICD-10-CM | POA: Diagnosis not present

## 2015-03-05 DIAGNOSIS — M810 Age-related osteoporosis without current pathological fracture: Secondary | ICD-10-CM | POA: Diagnosis not present

## 2015-03-05 DIAGNOSIS — R7301 Impaired fasting glucose: Secondary | ICD-10-CM | POA: Diagnosis not present

## 2015-03-05 DIAGNOSIS — Z791 Long term (current) use of non-steroidal anti-inflammatories (NSAID): Secondary | ICD-10-CM | POA: Diagnosis not present

## 2015-03-05 DIAGNOSIS — F339 Major depressive disorder, recurrent, unspecified: Secondary | ICD-10-CM | POA: Diagnosis not present

## 2015-03-05 DIAGNOSIS — M797 Fibromyalgia: Secondary | ICD-10-CM | POA: Diagnosis not present

## 2015-03-05 DIAGNOSIS — I10 Essential (primary) hypertension: Secondary | ICD-10-CM | POA: Diagnosis not present

## 2015-03-05 DIAGNOSIS — K219 Gastro-esophageal reflux disease without esophagitis: Secondary | ICD-10-CM | POA: Diagnosis not present

## 2015-03-05 DIAGNOSIS — R87612 Low grade squamous intraepithelial lesion on cytologic smear of cervix (LGSIL): Secondary | ICD-10-CM | POA: Diagnosis not present

## 2015-03-05 DIAGNOSIS — E782 Mixed hyperlipidemia: Secondary | ICD-10-CM | POA: Diagnosis not present

## 2015-03-08 DIAGNOSIS — M542 Cervicalgia: Secondary | ICD-10-CM | POA: Diagnosis not present

## 2015-03-23 DIAGNOSIS — M5412 Radiculopathy, cervical region: Secondary | ICD-10-CM | POA: Diagnosis not present

## 2015-03-23 DIAGNOSIS — M4802 Spinal stenosis, cervical region: Secondary | ICD-10-CM | POA: Diagnosis not present

## 2015-03-23 DIAGNOSIS — M5010 Cervical disc disorder with radiculopathy, unspecified cervical region: Secondary | ICD-10-CM | POA: Diagnosis not present

## 2015-04-12 DIAGNOSIS — M5010 Cervical disc disorder with radiculopathy, unspecified cervical region: Secondary | ICD-10-CM | POA: Diagnosis not present

## 2015-04-12 DIAGNOSIS — M4802 Spinal stenosis, cervical region: Secondary | ICD-10-CM | POA: Diagnosis not present

## 2015-04-12 DIAGNOSIS — M5412 Radiculopathy, cervical region: Secondary | ICD-10-CM | POA: Diagnosis not present

## 2015-05-04 DIAGNOSIS — M4802 Spinal stenosis, cervical region: Secondary | ICD-10-CM | POA: Diagnosis not present

## 2015-05-04 DIAGNOSIS — M545 Low back pain: Secondary | ICD-10-CM | POA: Diagnosis not present

## 2015-05-17 ENCOUNTER — Other Ambulatory Visit: Payer: Self-pay | Admitting: Orthopaedic Surgery

## 2015-05-17 DIAGNOSIS — M5416 Radiculopathy, lumbar region: Secondary | ICD-10-CM

## 2015-05-23 ENCOUNTER — Ambulatory Visit
Admission: RE | Admit: 2015-05-23 | Discharge: 2015-05-23 | Disposition: A | Discharge status: Home / Self Care | Payer: PPO | Source: Ambulatory Visit | Attending: Orthopaedic Surgery | Admitting: Orthopaedic Surgery

## 2015-05-23 DIAGNOSIS — M4806 Spinal stenosis, lumbar region: Secondary | ICD-10-CM | POA: Diagnosis not present

## 2015-05-23 DIAGNOSIS — M5416 Radiculopathy, lumbar region: Secondary | ICD-10-CM

## 2015-05-25 DIAGNOSIS — M4802 Spinal stenosis, cervical region: Secondary | ICD-10-CM | POA: Diagnosis not present

## 2015-05-25 DIAGNOSIS — M545 Low back pain: Secondary | ICD-10-CM | POA: Diagnosis not present

## 2015-05-26 DIAGNOSIS — Z86018 Personal history of other benign neoplasm: Secondary | ICD-10-CM | POA: Diagnosis not present

## 2015-05-26 DIAGNOSIS — L570 Actinic keratosis: Secondary | ICD-10-CM | POA: Diagnosis not present

## 2015-05-26 DIAGNOSIS — Z411 Encounter for cosmetic surgery: Secondary | ICD-10-CM | POA: Diagnosis not present

## 2015-05-26 DIAGNOSIS — C4371 Malignant melanoma of right lower limb, including hip: Secondary | ICD-10-CM | POA: Diagnosis not present

## 2015-05-26 DIAGNOSIS — L821 Other seborrheic keratosis: Secondary | ICD-10-CM | POA: Diagnosis not present

## 2015-05-26 DIAGNOSIS — Z85828 Personal history of other malignant neoplasm of skin: Secondary | ICD-10-CM | POA: Diagnosis not present

## 2015-05-26 DIAGNOSIS — D225 Melanocytic nevi of trunk: Secondary | ICD-10-CM | POA: Diagnosis not present

## 2015-05-26 DIAGNOSIS — D485 Neoplasm of uncertain behavior of skin: Secondary | ICD-10-CM | POA: Diagnosis not present

## 2015-06-07 DIAGNOSIS — M47816 Spondylosis without myelopathy or radiculopathy, lumbar region: Secondary | ICD-10-CM | POA: Diagnosis not present

## 2015-06-15 DIAGNOSIS — M25561 Pain in right knee: Secondary | ICD-10-CM | POA: Diagnosis not present

## 2015-06-15 DIAGNOSIS — M25562 Pain in left knee: Secondary | ICD-10-CM | POA: Diagnosis not present

## 2015-06-30 DIAGNOSIS — C4372 Malignant melanoma of left lower limb, including hip: Secondary | ICD-10-CM | POA: Diagnosis not present

## 2015-06-30 DIAGNOSIS — D485 Neoplasm of uncertain behavior of skin: Secondary | ICD-10-CM | POA: Diagnosis not present

## 2015-07-14 DIAGNOSIS — S81801D Unspecified open wound, right lower leg, subsequent encounter: Secondary | ICD-10-CM | POA: Diagnosis not present

## 2015-09-07 DIAGNOSIS — F339 Major depressive disorder, recurrent, unspecified: Secondary | ICD-10-CM | POA: Diagnosis not present

## 2015-09-07 DIAGNOSIS — F419 Anxiety disorder, unspecified: Secondary | ICD-10-CM | POA: Diagnosis not present

## 2015-09-07 DIAGNOSIS — R87612 Low grade squamous intraepithelial lesion on cytologic smear of cervix (LGSIL): Secondary | ICD-10-CM | POA: Diagnosis not present

## 2015-09-07 DIAGNOSIS — M810 Age-related osteoporosis without current pathological fracture: Secondary | ICD-10-CM | POA: Diagnosis not present

## 2015-09-07 DIAGNOSIS — E782 Mixed hyperlipidemia: Secondary | ICD-10-CM | POA: Diagnosis not present

## 2015-09-07 DIAGNOSIS — M797 Fibromyalgia: Secondary | ICD-10-CM | POA: Diagnosis not present

## 2015-09-07 DIAGNOSIS — R7301 Impaired fasting glucose: Secondary | ICD-10-CM | POA: Diagnosis not present

## 2015-09-07 DIAGNOSIS — J309 Allergic rhinitis, unspecified: Secondary | ICD-10-CM | POA: Diagnosis not present

## 2015-09-07 DIAGNOSIS — I10 Essential (primary) hypertension: Secondary | ICD-10-CM | POA: Diagnosis not present

## 2015-09-07 DIAGNOSIS — K219 Gastro-esophageal reflux disease without esophagitis: Secondary | ICD-10-CM | POA: Diagnosis not present

## 2015-09-07 DIAGNOSIS — Z791 Long term (current) use of non-steroidal anti-inflammatories (NSAID): Secondary | ICD-10-CM | POA: Diagnosis not present

## 2015-09-08 ENCOUNTER — Other Ambulatory Visit: Payer: Self-pay | Admitting: Family Medicine

## 2015-09-08 ENCOUNTER — Other Ambulatory Visit (HOSPITAL_COMMUNITY)
Admission: RE | Admit: 2015-09-08 | Discharge: 2015-09-08 | Disposition: A | Discharge status: Home / Self Care | Payer: PPO | Source: Ambulatory Visit | Attending: Family Medicine | Admitting: Family Medicine

## 2015-09-08 DIAGNOSIS — Z124 Encounter for screening for malignant neoplasm of cervix: Secondary | ICD-10-CM | POA: Diagnosis not present

## 2015-09-08 DIAGNOSIS — K219 Gastro-esophageal reflux disease without esophagitis: Secondary | ICD-10-CM | POA: Diagnosis not present

## 2015-09-08 DIAGNOSIS — I10 Essential (primary) hypertension: Secondary | ICD-10-CM | POA: Diagnosis not present

## 2015-09-08 DIAGNOSIS — Z01419 Encounter for gynecological examination (general) (routine) without abnormal findings: Secondary | ICD-10-CM | POA: Diagnosis not present

## 2015-09-08 DIAGNOSIS — F3341 Major depressive disorder, recurrent, in partial remission: Secondary | ICD-10-CM | POA: Diagnosis not present

## 2015-09-08 DIAGNOSIS — F419 Anxiety disorder, unspecified: Secondary | ICD-10-CM | POA: Diagnosis not present

## 2015-09-08 DIAGNOSIS — R87612 Low grade squamous intraepithelial lesion on cytologic smear of cervix (LGSIL): Secondary | ICD-10-CM | POA: Diagnosis not present

## 2015-09-08 DIAGNOSIS — N898 Other specified noninflammatory disorders of vagina: Secondary | ICD-10-CM | POA: Diagnosis not present

## 2015-09-08 DIAGNOSIS — G479 Sleep disorder, unspecified: Secondary | ICD-10-CM | POA: Diagnosis not present

## 2015-09-08 DIAGNOSIS — E782 Mixed hyperlipidemia: Secondary | ICD-10-CM | POA: Diagnosis not present

## 2015-09-08 DIAGNOSIS — Z0000 Encounter for general adult medical examination without abnormal findings: Secondary | ICD-10-CM | POA: Diagnosis not present

## 2015-09-08 DIAGNOSIS — M810 Age-related osteoporosis without current pathological fracture: Secondary | ICD-10-CM | POA: Diagnosis not present

## 2015-09-08 DIAGNOSIS — N3941 Urge incontinence: Secondary | ICD-10-CM | POA: Diagnosis not present

## 2015-09-10 LAB — CYTOLOGY - PAP

## 2015-09-28 DIAGNOSIS — M8588 Other specified disorders of bone density and structure, other site: Secondary | ICD-10-CM | POA: Diagnosis not present

## 2015-09-28 DIAGNOSIS — M810 Age-related osteoporosis without current pathological fracture: Secondary | ICD-10-CM | POA: Diagnosis not present

## 2015-11-03 DIAGNOSIS — H2513 Age-related nuclear cataract, bilateral: Secondary | ICD-10-CM | POA: Diagnosis not present

## 2015-11-22 DIAGNOSIS — M47812 Spondylosis without myelopathy or radiculopathy, cervical region: Secondary | ICD-10-CM | POA: Diagnosis not present

## 2015-11-26 DIAGNOSIS — M50321 Other cervical disc degeneration at C4-C5 level: Secondary | ICD-10-CM | POA: Diagnosis not present

## 2015-11-26 DIAGNOSIS — M25561 Pain in right knee: Secondary | ICD-10-CM | POA: Diagnosis not present

## 2015-11-26 DIAGNOSIS — M25551 Pain in right hip: Secondary | ICD-10-CM | POA: Diagnosis not present

## 2015-11-26 DIAGNOSIS — M25562 Pain in left knee: Secondary | ICD-10-CM | POA: Diagnosis not present

## 2015-11-26 DIAGNOSIS — M461 Sacroiliitis, not elsewhere classified: Secondary | ICD-10-CM | POA: Diagnosis not present

## 2015-11-26 DIAGNOSIS — M791 Myalgia: Secondary | ICD-10-CM | POA: Diagnosis not present

## 2015-11-26 DIAGNOSIS — M25552 Pain in left hip: Secondary | ICD-10-CM | POA: Diagnosis not present

## 2015-11-26 DIAGNOSIS — M50221 Other cervical disc displacement at C4-C5 level: Secondary | ICD-10-CM | POA: Diagnosis not present

## 2015-11-26 DIAGNOSIS — M542 Cervicalgia: Secondary | ICD-10-CM | POA: Diagnosis not present

## 2015-11-26 DIAGNOSIS — M545 Low back pain: Secondary | ICD-10-CM | POA: Diagnosis not present

## 2015-12-07 DIAGNOSIS — M25562 Pain in left knee: Secondary | ICD-10-CM | POA: Diagnosis not present

## 2015-12-07 DIAGNOSIS — M25551 Pain in right hip: Secondary | ICD-10-CM | POA: Diagnosis not present

## 2015-12-07 DIAGNOSIS — M50221 Other cervical disc displacement at C4-C5 level: Secondary | ICD-10-CM | POA: Diagnosis not present

## 2015-12-07 DIAGNOSIS — M542 Cervicalgia: Secondary | ICD-10-CM | POA: Diagnosis not present

## 2015-12-07 DIAGNOSIS — M50321 Other cervical disc degeneration at C4-C5 level: Secondary | ICD-10-CM | POA: Diagnosis not present

## 2015-12-07 DIAGNOSIS — M791 Myalgia: Secondary | ICD-10-CM | POA: Diagnosis not present

## 2015-12-07 DIAGNOSIS — M461 Sacroiliitis, not elsewhere classified: Secondary | ICD-10-CM | POA: Diagnosis not present

## 2015-12-07 DIAGNOSIS — M25552 Pain in left hip: Secondary | ICD-10-CM | POA: Diagnosis not present

## 2015-12-07 DIAGNOSIS — M25561 Pain in right knee: Secondary | ICD-10-CM | POA: Diagnosis not present

## 2015-12-07 DIAGNOSIS — M545 Low back pain: Secondary | ICD-10-CM | POA: Diagnosis not present

## 2015-12-09 DIAGNOSIS — M461 Sacroiliitis, not elsewhere classified: Secondary | ICD-10-CM | POA: Diagnosis not present

## 2015-12-09 DIAGNOSIS — M791 Myalgia: Secondary | ICD-10-CM | POA: Diagnosis not present

## 2015-12-09 DIAGNOSIS — M50221 Other cervical disc displacement at C4-C5 level: Secondary | ICD-10-CM | POA: Diagnosis not present

## 2015-12-09 DIAGNOSIS — M50321 Other cervical disc degeneration at C4-C5 level: Secondary | ICD-10-CM | POA: Diagnosis not present

## 2015-12-15 DIAGNOSIS — M5137 Other intervertebral disc degeneration, lumbosacral region: Secondary | ICD-10-CM | POA: Diagnosis not present

## 2015-12-15 DIAGNOSIS — M461 Sacroiliitis, not elsewhere classified: Secondary | ICD-10-CM | POA: Diagnosis not present

## 2015-12-15 DIAGNOSIS — M542 Cervicalgia: Secondary | ICD-10-CM | POA: Diagnosis not present

## 2015-12-15 DIAGNOSIS — M50321 Other cervical disc degeneration at C4-C5 level: Secondary | ICD-10-CM | POA: Diagnosis not present

## 2015-12-15 DIAGNOSIS — M50221 Other cervical disc displacement at C4-C5 level: Secondary | ICD-10-CM | POA: Diagnosis not present

## 2015-12-15 DIAGNOSIS — M791 Myalgia: Secondary | ICD-10-CM | POA: Diagnosis not present

## 2015-12-15 DIAGNOSIS — M5127 Other intervertebral disc displacement, lumbosacral region: Secondary | ICD-10-CM | POA: Diagnosis not present

## 2015-12-16 DIAGNOSIS — M791 Myalgia: Secondary | ICD-10-CM | POA: Diagnosis not present

## 2015-12-16 DIAGNOSIS — M461 Sacroiliitis, not elsewhere classified: Secondary | ICD-10-CM | POA: Diagnosis not present

## 2015-12-16 DIAGNOSIS — M50321 Other cervical disc degeneration at C4-C5 level: Secondary | ICD-10-CM | POA: Diagnosis not present

## 2015-12-16 DIAGNOSIS — M50221 Other cervical disc displacement at C4-C5 level: Secondary | ICD-10-CM | POA: Diagnosis not present

## 2015-12-20 ENCOUNTER — Other Ambulatory Visit: Payer: Self-pay | Admitting: Family Medicine

## 2015-12-20 DIAGNOSIS — M4722 Other spondylosis with radiculopathy, cervical region: Secondary | ICD-10-CM | POA: Diagnosis not present

## 2015-12-20 DIAGNOSIS — Z1231 Encounter for screening mammogram for malignant neoplasm of breast: Secondary | ICD-10-CM

## 2015-12-20 DIAGNOSIS — R202 Paresthesia of skin: Secondary | ICD-10-CM | POA: Diagnosis not present

## 2015-12-22 DIAGNOSIS — R400 Somnolence: Secondary | ICD-10-CM | POA: Diagnosis not present

## 2015-12-22 DIAGNOSIS — R42 Dizziness and giddiness: Secondary | ICD-10-CM | POA: Diagnosis not present

## 2015-12-22 DIAGNOSIS — H5371 Glare sensitivity: Secondary | ICD-10-CM | POA: Diagnosis not present

## 2015-12-22 DIAGNOSIS — F411 Generalized anxiety disorder: Secondary | ICD-10-CM | POA: Diagnosis not present

## 2015-12-28 DIAGNOSIS — D1800 Hemangioma unspecified site: Secondary | ICD-10-CM | POA: Diagnosis not present

## 2015-12-28 DIAGNOSIS — C44722 Squamous cell carcinoma of skin of right lower limb, including hip: Secondary | ICD-10-CM | POA: Diagnosis not present

## 2015-12-28 DIAGNOSIS — L603 Nail dystrophy: Secondary | ICD-10-CM | POA: Diagnosis not present

## 2015-12-28 DIAGNOSIS — M542 Cervicalgia: Secondary | ICD-10-CM | POA: Diagnosis not present

## 2015-12-28 DIAGNOSIS — M50321 Other cervical disc degeneration at C4-C5 level: Secondary | ICD-10-CM | POA: Diagnosis not present

## 2015-12-28 DIAGNOSIS — D225 Melanocytic nevi of trunk: Secondary | ICD-10-CM | POA: Diagnosis not present

## 2015-12-28 DIAGNOSIS — M50221 Other cervical disc displacement at C4-C5 level: Secondary | ICD-10-CM | POA: Diagnosis not present

## 2015-12-28 DIAGNOSIS — L570 Actinic keratosis: Secondary | ICD-10-CM | POA: Diagnosis not present

## 2015-12-28 DIAGNOSIS — Z86018 Personal history of other benign neoplasm: Secondary | ICD-10-CM | POA: Diagnosis not present

## 2015-12-28 DIAGNOSIS — L814 Other melanin hyperpigmentation: Secondary | ICD-10-CM | POA: Diagnosis not present

## 2015-12-28 DIAGNOSIS — M791 Myalgia: Secondary | ICD-10-CM | POA: Diagnosis not present

## 2015-12-28 DIAGNOSIS — D485 Neoplasm of uncertain behavior of skin: Secondary | ICD-10-CM | POA: Diagnosis not present

## 2015-12-28 DIAGNOSIS — Z23 Encounter for immunization: Secondary | ICD-10-CM | POA: Diagnosis not present

## 2015-12-28 DIAGNOSIS — M461 Sacroiliitis, not elsewhere classified: Secondary | ICD-10-CM | POA: Diagnosis not present

## 2015-12-28 DIAGNOSIS — L821 Other seborrheic keratosis: Secondary | ICD-10-CM | POA: Diagnosis not present

## 2015-12-28 DIAGNOSIS — Z85820 Personal history of malignant melanoma of skin: Secondary | ICD-10-CM | POA: Diagnosis not present

## 2015-12-28 DIAGNOSIS — Z85828 Personal history of other malignant neoplasm of skin: Secondary | ICD-10-CM | POA: Diagnosis not present

## 2015-12-30 DIAGNOSIS — M461 Sacroiliitis, not elsewhere classified: Secondary | ICD-10-CM | POA: Diagnosis not present

## 2015-12-30 DIAGNOSIS — M2351 Chronic instability of knee, right knee: Secondary | ICD-10-CM | POA: Diagnosis not present

## 2015-12-30 DIAGNOSIS — M1711 Unilateral primary osteoarthritis, right knee: Secondary | ICD-10-CM | POA: Diagnosis not present

## 2015-12-30 DIAGNOSIS — M50321 Other cervical disc degeneration at C4-C5 level: Secondary | ICD-10-CM | POA: Diagnosis not present

## 2015-12-30 DIAGNOSIS — M50221 Other cervical disc displacement at C4-C5 level: Secondary | ICD-10-CM | POA: Diagnosis not present

## 2015-12-30 DIAGNOSIS — M791 Myalgia: Secondary | ICD-10-CM | POA: Diagnosis not present

## 2016-01-03 DIAGNOSIS — M2352 Chronic instability of knee, left knee: Secondary | ICD-10-CM | POA: Diagnosis not present

## 2016-01-03 DIAGNOSIS — M461 Sacroiliitis, not elsewhere classified: Secondary | ICD-10-CM | POA: Diagnosis not present

## 2016-01-03 DIAGNOSIS — M50321 Other cervical disc degeneration at C4-C5 level: Secondary | ICD-10-CM | POA: Diagnosis not present

## 2016-01-03 DIAGNOSIS — M542 Cervicalgia: Secondary | ICD-10-CM | POA: Diagnosis not present

## 2016-01-03 DIAGNOSIS — M791 Myalgia: Secondary | ICD-10-CM | POA: Diagnosis not present

## 2016-01-03 DIAGNOSIS — M50221 Other cervical disc displacement at C4-C5 level: Secondary | ICD-10-CM | POA: Diagnosis not present

## 2016-01-03 DIAGNOSIS — M1712 Unilateral primary osteoarthritis, left knee: Secondary | ICD-10-CM | POA: Diagnosis not present

## 2016-01-05 DIAGNOSIS — M50321 Other cervical disc degeneration at C4-C5 level: Secondary | ICD-10-CM | POA: Diagnosis not present

## 2016-01-05 DIAGNOSIS — M461 Sacroiliitis, not elsewhere classified: Secondary | ICD-10-CM | POA: Diagnosis not present

## 2016-01-05 DIAGNOSIS — M791 Myalgia: Secondary | ICD-10-CM | POA: Diagnosis not present

## 2016-01-05 DIAGNOSIS — M50221 Other cervical disc displacement at C4-C5 level: Secondary | ICD-10-CM | POA: Diagnosis not present

## 2016-01-17 DIAGNOSIS — M50321 Other cervical disc degeneration at C4-C5 level: Secondary | ICD-10-CM | POA: Diagnosis not present

## 2016-01-17 DIAGNOSIS — M791 Myalgia: Secondary | ICD-10-CM | POA: Diagnosis not present

## 2016-01-17 DIAGNOSIS — M542 Cervicalgia: Secondary | ICD-10-CM | POA: Diagnosis not present

## 2016-01-17 DIAGNOSIS — M461 Sacroiliitis, not elsewhere classified: Secondary | ICD-10-CM | POA: Diagnosis not present

## 2016-01-17 DIAGNOSIS — M50221 Other cervical disc displacement at C4-C5 level: Secondary | ICD-10-CM | POA: Diagnosis not present

## 2016-01-19 DIAGNOSIS — M461 Sacroiliitis, not elsewhere classified: Secondary | ICD-10-CM | POA: Diagnosis not present

## 2016-01-19 DIAGNOSIS — M50221 Other cervical disc displacement at C4-C5 level: Secondary | ICD-10-CM | POA: Diagnosis not present

## 2016-01-19 DIAGNOSIS — M791 Myalgia: Secondary | ICD-10-CM | POA: Diagnosis not present

## 2016-01-19 DIAGNOSIS — M50321 Other cervical disc degeneration at C4-C5 level: Secondary | ICD-10-CM | POA: Diagnosis not present

## 2016-01-24 DIAGNOSIS — M461 Sacroiliitis, not elsewhere classified: Secondary | ICD-10-CM | POA: Diagnosis not present

## 2016-01-24 DIAGNOSIS — M542 Cervicalgia: Secondary | ICD-10-CM | POA: Diagnosis not present

## 2016-01-24 DIAGNOSIS — M5127 Other intervertebral disc displacement, lumbosacral region: Secondary | ICD-10-CM | POA: Diagnosis not present

## 2016-01-24 DIAGNOSIS — M5137 Other intervertebral disc degeneration, lumbosacral region: Secondary | ICD-10-CM | POA: Diagnosis not present

## 2016-01-24 DIAGNOSIS — M791 Myalgia: Secondary | ICD-10-CM | POA: Diagnosis not present

## 2016-01-26 DIAGNOSIS — M5127 Other intervertebral disc displacement, lumbosacral region: Secondary | ICD-10-CM | POA: Diagnosis not present

## 2016-01-26 DIAGNOSIS — M791 Myalgia: Secondary | ICD-10-CM | POA: Diagnosis not present

## 2016-01-26 DIAGNOSIS — M5137 Other intervertebral disc degeneration, lumbosacral region: Secondary | ICD-10-CM | POA: Diagnosis not present

## 2016-01-26 DIAGNOSIS — M461 Sacroiliitis, not elsewhere classified: Secondary | ICD-10-CM | POA: Diagnosis not present

## 2016-01-27 DIAGNOSIS — C44722 Squamous cell carcinoma of skin of right lower limb, including hip: Secondary | ICD-10-CM | POA: Diagnosis not present

## 2016-01-28 ENCOUNTER — Ambulatory Visit: Payer: PPO

## 2016-01-31 DIAGNOSIS — M542 Cervicalgia: Secondary | ICD-10-CM | POA: Diagnosis not present

## 2016-01-31 DIAGNOSIS — M5137 Other intervertebral disc degeneration, lumbosacral region: Secondary | ICD-10-CM | POA: Diagnosis not present

## 2016-01-31 DIAGNOSIS — M5127 Other intervertebral disc displacement, lumbosacral region: Secondary | ICD-10-CM | POA: Diagnosis not present

## 2016-01-31 DIAGNOSIS — M791 Myalgia: Secondary | ICD-10-CM | POA: Diagnosis not present

## 2016-01-31 DIAGNOSIS — M461 Sacroiliitis, not elsewhere classified: Secondary | ICD-10-CM | POA: Diagnosis not present

## 2016-02-02 DIAGNOSIS — M542 Cervicalgia: Secondary | ICD-10-CM | POA: Diagnosis not present

## 2016-02-02 DIAGNOSIS — M461 Sacroiliitis, not elsewhere classified: Secondary | ICD-10-CM | POA: Diagnosis not present

## 2016-02-02 DIAGNOSIS — M791 Myalgia: Secondary | ICD-10-CM | POA: Diagnosis not present

## 2016-02-02 DIAGNOSIS — M5137 Other intervertebral disc degeneration, lumbosacral region: Secondary | ICD-10-CM | POA: Diagnosis not present

## 2016-02-02 DIAGNOSIS — M5127 Other intervertebral disc displacement, lumbosacral region: Secondary | ICD-10-CM | POA: Diagnosis not present

## 2016-02-07 DIAGNOSIS — M5137 Other intervertebral disc degeneration, lumbosacral region: Secondary | ICD-10-CM | POA: Diagnosis not present

## 2016-02-07 DIAGNOSIS — M545 Low back pain: Secondary | ICD-10-CM | POA: Diagnosis not present

## 2016-02-07 DIAGNOSIS — M5127 Other intervertebral disc displacement, lumbosacral region: Secondary | ICD-10-CM | POA: Diagnosis not present

## 2016-02-07 DIAGNOSIS — M791 Myalgia: Secondary | ICD-10-CM | POA: Diagnosis not present

## 2016-02-07 DIAGNOSIS — M461 Sacroiliitis, not elsewhere classified: Secondary | ICD-10-CM | POA: Diagnosis not present

## 2016-02-10 DIAGNOSIS — M5137 Other intervertebral disc degeneration, lumbosacral region: Secondary | ICD-10-CM | POA: Diagnosis not present

## 2016-02-10 DIAGNOSIS — M461 Sacroiliitis, not elsewhere classified: Secondary | ICD-10-CM | POA: Diagnosis not present

## 2016-02-10 DIAGNOSIS — M5127 Other intervertebral disc displacement, lumbosacral region: Secondary | ICD-10-CM | POA: Diagnosis not present

## 2016-02-10 DIAGNOSIS — M791 Myalgia: Secondary | ICD-10-CM | POA: Diagnosis not present

## 2016-02-10 DIAGNOSIS — M545 Low back pain: Secondary | ICD-10-CM | POA: Diagnosis not present

## 2016-02-14 DIAGNOSIS — R202 Paresthesia of skin: Secondary | ICD-10-CM | POA: Diagnosis not present

## 2016-02-14 DIAGNOSIS — M4726 Other spondylosis with radiculopathy, lumbar region: Secondary | ICD-10-CM | POA: Diagnosis not present

## 2016-02-17 DIAGNOSIS — M461 Sacroiliitis, not elsewhere classified: Secondary | ICD-10-CM | POA: Diagnosis not present

## 2016-02-17 DIAGNOSIS — M545 Low back pain: Secondary | ICD-10-CM | POA: Diagnosis not present

## 2016-02-17 DIAGNOSIS — M5137 Other intervertebral disc degeneration, lumbosacral region: Secondary | ICD-10-CM | POA: Diagnosis not present

## 2016-02-17 DIAGNOSIS — M5127 Other intervertebral disc displacement, lumbosacral region: Secondary | ICD-10-CM | POA: Diagnosis not present

## 2016-02-17 DIAGNOSIS — M791 Myalgia: Secondary | ICD-10-CM | POA: Diagnosis not present

## 2016-02-23 DIAGNOSIS — M791 Myalgia: Secondary | ICD-10-CM | POA: Diagnosis not present

## 2016-02-23 DIAGNOSIS — M5137 Other intervertebral disc degeneration, lumbosacral region: Secondary | ICD-10-CM | POA: Diagnosis not present

## 2016-02-23 DIAGNOSIS — M461 Sacroiliitis, not elsewhere classified: Secondary | ICD-10-CM | POA: Diagnosis not present

## 2016-02-23 DIAGNOSIS — M545 Low back pain: Secondary | ICD-10-CM | POA: Diagnosis not present

## 2016-02-23 DIAGNOSIS — M5127 Other intervertebral disc displacement, lumbosacral region: Secondary | ICD-10-CM | POA: Diagnosis not present

## 2016-02-25 DIAGNOSIS — M461 Sacroiliitis, not elsewhere classified: Secondary | ICD-10-CM | POA: Diagnosis not present

## 2016-02-25 DIAGNOSIS — M791 Myalgia: Secondary | ICD-10-CM | POA: Diagnosis not present

## 2016-02-25 DIAGNOSIS — M545 Low back pain: Secondary | ICD-10-CM | POA: Diagnosis not present

## 2016-02-25 DIAGNOSIS — M5127 Other intervertebral disc displacement, lumbosacral region: Secondary | ICD-10-CM | POA: Diagnosis not present

## 2016-02-25 DIAGNOSIS — M5137 Other intervertebral disc degeneration, lumbosacral region: Secondary | ICD-10-CM | POA: Diagnosis not present

## 2016-03-01 DIAGNOSIS — M5137 Other intervertebral disc degeneration, lumbosacral region: Secondary | ICD-10-CM | POA: Diagnosis not present

## 2016-03-01 DIAGNOSIS — M791 Myalgia: Secondary | ICD-10-CM | POA: Diagnosis not present

## 2016-03-01 DIAGNOSIS — M5127 Other intervertebral disc displacement, lumbosacral region: Secondary | ICD-10-CM | POA: Diagnosis not present

## 2016-03-01 DIAGNOSIS — M461 Sacroiliitis, not elsewhere classified: Secondary | ICD-10-CM | POA: Diagnosis not present

## 2016-03-01 DIAGNOSIS — M545 Low back pain: Secondary | ICD-10-CM | POA: Diagnosis not present

## 2016-03-02 ENCOUNTER — Ambulatory Visit
Admission: RE | Admit: 2016-03-02 | Discharge: 2016-03-02 | Disposition: A | Discharge status: Home / Self Care | Payer: PPO | Source: Ambulatory Visit | Attending: Family Medicine | Admitting: Family Medicine

## 2016-03-02 DIAGNOSIS — Z1231 Encounter for screening mammogram for malignant neoplasm of breast: Secondary | ICD-10-CM | POA: Diagnosis not present

## 2016-03-03 DIAGNOSIS — M461 Sacroiliitis, not elsewhere classified: Secondary | ICD-10-CM | POA: Diagnosis not present

## 2016-03-03 DIAGNOSIS — M5137 Other intervertebral disc degeneration, lumbosacral region: Secondary | ICD-10-CM | POA: Diagnosis not present

## 2016-03-03 DIAGNOSIS — M791 Myalgia: Secondary | ICD-10-CM | POA: Diagnosis not present

## 2016-03-03 DIAGNOSIS — M545 Low back pain: Secondary | ICD-10-CM | POA: Diagnosis not present

## 2016-03-03 DIAGNOSIS — M5127 Other intervertebral disc displacement, lumbosacral region: Secondary | ICD-10-CM | POA: Diagnosis not present

## 2016-03-06 DIAGNOSIS — M791 Myalgia: Secondary | ICD-10-CM | POA: Diagnosis not present

## 2016-03-06 DIAGNOSIS — M545 Low back pain: Secondary | ICD-10-CM | POA: Diagnosis not present

## 2016-03-08 DIAGNOSIS — M25561 Pain in right knee: Secondary | ICD-10-CM | POA: Diagnosis not present

## 2016-03-08 DIAGNOSIS — R7301 Impaired fasting glucose: Secondary | ICD-10-CM | POA: Diagnosis not present

## 2016-03-08 DIAGNOSIS — M810 Age-related osteoporosis without current pathological fracture: Secondary | ICD-10-CM | POA: Diagnosis not present

## 2016-03-08 DIAGNOSIS — I10 Essential (primary) hypertension: Secondary | ICD-10-CM | POA: Diagnosis not present

## 2016-03-08 DIAGNOSIS — E782 Mixed hyperlipidemia: Secondary | ICD-10-CM | POA: Diagnosis not present

## 2016-03-08 DIAGNOSIS — M1711 Unilateral primary osteoarthritis, right knee: Secondary | ICD-10-CM | POA: Diagnosis not present

## 2016-03-13 DIAGNOSIS — M25562 Pain in left knee: Secondary | ICD-10-CM | POA: Diagnosis not present

## 2016-03-13 DIAGNOSIS — M1712 Unilateral primary osteoarthritis, left knee: Secondary | ICD-10-CM | POA: Diagnosis not present

## 2016-03-14 DIAGNOSIS — M797 Fibromyalgia: Secondary | ICD-10-CM | POA: Diagnosis not present

## 2016-03-14 DIAGNOSIS — E782 Mixed hyperlipidemia: Secondary | ICD-10-CM | POA: Diagnosis not present

## 2016-03-14 DIAGNOSIS — F3341 Major depressive disorder, recurrent, in partial remission: Secondary | ICD-10-CM | POA: Diagnosis not present

## 2016-03-14 DIAGNOSIS — M810 Age-related osteoporosis without current pathological fracture: Secondary | ICD-10-CM | POA: Diagnosis not present

## 2016-03-14 DIAGNOSIS — K219 Gastro-esophageal reflux disease without esophagitis: Secondary | ICD-10-CM | POA: Diagnosis not present

## 2016-03-14 DIAGNOSIS — J309 Allergic rhinitis, unspecified: Secondary | ICD-10-CM | POA: Diagnosis not present

## 2016-03-14 DIAGNOSIS — R7301 Impaired fasting glucose: Secondary | ICD-10-CM | POA: Diagnosis not present

## 2016-03-14 DIAGNOSIS — G479 Sleep disorder, unspecified: Secondary | ICD-10-CM | POA: Diagnosis not present

## 2016-03-14 DIAGNOSIS — F419 Anxiety disorder, unspecified: Secondary | ICD-10-CM | POA: Diagnosis not present

## 2016-03-14 DIAGNOSIS — N3941 Urge incontinence: Secondary | ICD-10-CM | POA: Diagnosis not present

## 2016-03-14 DIAGNOSIS — I10 Essential (primary) hypertension: Secondary | ICD-10-CM | POA: Diagnosis not present

## 2016-03-16 DIAGNOSIS — M25561 Pain in right knee: Secondary | ICD-10-CM | POA: Diagnosis not present

## 2016-03-16 DIAGNOSIS — M1711 Unilateral primary osteoarthritis, right knee: Secondary | ICD-10-CM | POA: Diagnosis not present

## 2016-03-20 DIAGNOSIS — M1712 Unilateral primary osteoarthritis, left knee: Secondary | ICD-10-CM | POA: Diagnosis not present

## 2016-03-20 DIAGNOSIS — M25562 Pain in left knee: Secondary | ICD-10-CM | POA: Diagnosis not present

## 2016-03-22 DIAGNOSIS — M1711 Unilateral primary osteoarthritis, right knee: Secondary | ICD-10-CM | POA: Diagnosis not present

## 2016-03-22 DIAGNOSIS — M25561 Pain in right knee: Secondary | ICD-10-CM | POA: Diagnosis not present

## 2016-03-27 DIAGNOSIS — M1712 Unilateral primary osteoarthritis, left knee: Secondary | ICD-10-CM | POA: Diagnosis not present

## 2016-03-27 DIAGNOSIS — M25562 Pain in left knee: Secondary | ICD-10-CM | POA: Diagnosis not present

## 2016-03-29 DIAGNOSIS — M25561 Pain in right knee: Secondary | ICD-10-CM | POA: Diagnosis not present

## 2016-03-29 DIAGNOSIS — M1711 Unilateral primary osteoarthritis, right knee: Secondary | ICD-10-CM | POA: Diagnosis not present

## 2016-04-03 DIAGNOSIS — M25562 Pain in left knee: Secondary | ICD-10-CM | POA: Diagnosis not present

## 2016-04-03 DIAGNOSIS — M1712 Unilateral primary osteoarthritis, left knee: Secondary | ICD-10-CM | POA: Diagnosis not present

## 2016-04-07 DIAGNOSIS — M25561 Pain in right knee: Secondary | ICD-10-CM | POA: Diagnosis not present

## 2016-04-07 DIAGNOSIS — M1711 Unilateral primary osteoarthritis, right knee: Secondary | ICD-10-CM | POA: Diagnosis not present

## 2016-04-10 DIAGNOSIS — M1712 Unilateral primary osteoarthritis, left knee: Secondary | ICD-10-CM | POA: Diagnosis not present

## 2016-04-10 DIAGNOSIS — M25562 Pain in left knee: Secondary | ICD-10-CM | POA: Diagnosis not present

## 2016-04-14 DIAGNOSIS — M1711 Unilateral primary osteoarthritis, right knee: Secondary | ICD-10-CM | POA: Diagnosis not present

## 2016-04-14 DIAGNOSIS — M2351 Chronic instability of knee, right knee: Secondary | ICD-10-CM | POA: Diagnosis not present

## 2016-04-14 DIAGNOSIS — M25561 Pain in right knee: Secondary | ICD-10-CM | POA: Diagnosis not present

## 2016-04-14 DIAGNOSIS — M791 Myalgia: Secondary | ICD-10-CM | POA: Diagnosis not present

## 2016-04-17 DIAGNOSIS — M1712 Unilateral primary osteoarthritis, left knee: Secondary | ICD-10-CM | POA: Diagnosis not present

## 2016-04-17 DIAGNOSIS — M25562 Pain in left knee: Secondary | ICD-10-CM | POA: Diagnosis not present

## 2016-04-26 DIAGNOSIS — M1711 Unilateral primary osteoarthritis, right knee: Secondary | ICD-10-CM | POA: Diagnosis not present

## 2016-04-26 DIAGNOSIS — M25561 Pain in right knee: Secondary | ICD-10-CM | POA: Diagnosis not present

## 2016-04-28 DIAGNOSIS — M25562 Pain in left knee: Secondary | ICD-10-CM | POA: Diagnosis not present

## 2016-04-28 DIAGNOSIS — M1712 Unilateral primary osteoarthritis, left knee: Secondary | ICD-10-CM | POA: Diagnosis not present

## 2016-05-29 DIAGNOSIS — Z85828 Personal history of other malignant neoplasm of skin: Secondary | ICD-10-CM | POA: Diagnosis not present

## 2016-05-29 DIAGNOSIS — D1800 Hemangioma unspecified site: Secondary | ICD-10-CM | POA: Diagnosis not present

## 2016-05-29 DIAGNOSIS — L821 Other seborrheic keratosis: Secondary | ICD-10-CM | POA: Diagnosis not present

## 2016-05-29 DIAGNOSIS — Z86018 Personal history of other benign neoplasm: Secondary | ICD-10-CM | POA: Diagnosis not present

## 2016-05-29 DIAGNOSIS — Z85820 Personal history of malignant melanoma of skin: Secondary | ICD-10-CM | POA: Diagnosis not present

## 2016-05-29 DIAGNOSIS — D225 Melanocytic nevi of trunk: Secondary | ICD-10-CM | POA: Diagnosis not present

## 2016-05-29 DIAGNOSIS — L814 Other melanin hyperpigmentation: Secondary | ICD-10-CM | POA: Diagnosis not present

## 2016-08-02 ENCOUNTER — Telehealth (INDEPENDENT_AMBULATORY_CARE_PROVIDER_SITE_OTHER): Payer: Self-pay | Admitting: Physical Medicine and Rehabilitation

## 2016-08-02 NOTE — Telephone Encounter (Signed)
Unfortunately, as you know, we can not just jump into radiofrequency ablation and would need OV to explain the somewhat convoluted process. She may be a candidate but she needs hear the whole story.

## 2016-08-03 NOTE — Telephone Encounter (Signed)
Scheduled for OV 08/17/16 at 0915.

## 2016-08-17 ENCOUNTER — Encounter (INDEPENDENT_AMBULATORY_CARE_PROVIDER_SITE_OTHER): Payer: Self-pay | Admitting: Physical Medicine and Rehabilitation

## 2016-08-17 ENCOUNTER — Ambulatory Visit (INDEPENDENT_AMBULATORY_CARE_PROVIDER_SITE_OTHER): Payer: PPO | Admitting: Physical Medicine and Rehabilitation

## 2016-08-17 ENCOUNTER — Telehealth (INDEPENDENT_AMBULATORY_CARE_PROVIDER_SITE_OTHER): Payer: Self-pay | Admitting: Physical Medicine and Rehabilitation

## 2016-08-17 VITALS — BP 136/77 | HR 66

## 2016-08-17 DIAGNOSIS — M545 Low back pain, unspecified: Secondary | ICD-10-CM

## 2016-08-17 DIAGNOSIS — M47816 Spondylosis without myelopathy or radiculopathy, lumbar region: Secondary | ICD-10-CM | POA: Diagnosis not present

## 2016-08-17 DIAGNOSIS — M609 Myositis, unspecified: Secondary | ICD-10-CM

## 2016-08-17 DIAGNOSIS — M542 Cervicalgia: Secondary | ICD-10-CM | POA: Diagnosis not present

## 2016-08-17 DIAGNOSIS — G8929 Other chronic pain: Secondary | ICD-10-CM

## 2016-08-17 DIAGNOSIS — M797 Fibromyalgia: Secondary | ICD-10-CM | POA: Diagnosis not present

## 2016-08-17 DIAGNOSIS — M961 Postlaminectomy syndrome, not elsewhere classified: Secondary | ICD-10-CM | POA: Diagnosis not present

## 2016-08-17 DIAGNOSIS — M5412 Radiculopathy, cervical region: Secondary | ICD-10-CM | POA: Diagnosis not present

## 2016-08-17 DIAGNOSIS — M4316 Spondylolisthesis, lumbar region: Secondary | ICD-10-CM | POA: Diagnosis not present

## 2016-08-17 NOTE — Progress Notes (Deleted)
Lower back pain and knee pain. Pain is on both sides, but right is worse. Pain radiates down legs front and back, pain in tops of feet.  Neck pain worse on the left. Bilateral shoulder pain left worse than right. Pain and numbness both arms and hands. Has been going to a chiropractor and getting lidocaine injections.

## 2016-08-18 ENCOUNTER — Encounter (INDEPENDENT_AMBULATORY_CARE_PROVIDER_SITE_OTHER): Payer: Self-pay | Admitting: Physical Medicine and Rehabilitation

## 2016-08-18 NOTE — Telephone Encounter (Signed)
Note completed 

## 2016-08-18 NOTE — Progress Notes (Signed)
Bonnie Juarez - 71 y.o. female MRN [id number]  Date of birth: 06-30-1945  Office Visit Note: Visit Date: 08/17/2016 PCP: Cari Caraway, MD Referred by: Cari Caraway, MD  Subjective: Chief Complaint  Patient presents with  . Lower Back - Pain  . Neck - Pain   HPI: Mrs. Lie is a 71 year old right-hand dominant female that we seen on many occasions in the past for spinal intervention procedures which have helped her to some degree. She is followed in our office mainly by Dr. Lorin Mercy. The last time I saw her with several months ago in September 2017 and completed the cervical facet joint injection. Prior to that in April 2017 we completed a facet joint block which gave her some relief. She comes back in today after having seen a medical group in town called the "[facility]" that involves chiropractic care as well as something called functional medicine. The practice seems to specialize in adrenal fatigue, fibromyalgia, biotoxin's and heavy metal toxicity etc. She says she spent a lot of time there does feel much better. I did question why she was still in the office today and is really severe neck pain and lower back pain and really all over widespread body pain. Her clinical course's comp dictated by fibromyalgia as well as myofascial pain. She's had a cervical fusion performed many years ago and her most recent cervical MRI from 2016 shows adjacent level disease with stenosis and Dr. Lorin Mercy and recommended at one point further surgery. She reports shoulder pain worse on the left than right. She gets pain numbness and tingling in a nondermatomal fashion in both the arms and the hands. She feels like the arms and hands are getting worse and the weakness is getting worse. She did get what she referred to as lidocaine injections at the previous place we mentioned. His house and they were trigger point injections. She has had physical therapy in the past. In terms of her lower back she gets lower  back pain across the lower back worse with standing worse with going from sit to stand refers into both hips bilaterally and laterally. She gets some pain in the tops of the feet. She also has knee pain. She has a history of Baker cyst. Dr. Lorin Mercy is been following this. She has no specific focal weakness or foot drop. She reports no real medications are helping. She is mainly been using Tylenol and Advil. She does worry about the Advil as it does upset her stomach. She has no history of ulcers. She does have history of hypertension but no heart disease or diabetes.  She has not had any red flag symptoms such as bowel or bladder problems or new issues. She has had no specific night pain although is difficult for her to get to sleep. She's had no unintended weight loss. MRI of the cervical and lumbar spine is reviewed below.    Review of Systems  Constitutional: Positive for malaise/fatigue. Negative for chills, fever and weight loss.  HENT: Negative for hearing loss and sinus pain.   Eyes: Negative for blurred vision, double vision and photophobia.  Respiratory: Negative for cough and shortness of breath.   Cardiovascular: Negative for chest pain, palpitations and leg swelling.  Gastrointestinal: Negative for abdominal pain, nausea and vomiting.  Genitourinary: Negative for flank pain.  Musculoskeletal: Positive for back pain, joint pain and neck pain. Negative for myalgias.  Skin: Negative for itching and rash.  Neurological: Positive for tingling and weakness. Negative  for tremors and focal weakness.  Endo/Heme/Allergies: Negative.   Psychiatric/Behavioral: Negative for depression.  All other systems reviewed and are negative.  Otherwise per HPI.  Assessment & Plan: Visit Diagnoses:  1. Cervical radiculopathy   2. Post laminectomy syndrome   3. Cervicalgia   4. Fibromyalgia   5. Myofascitis   6. Chronic bilateral low back pain without sciatica   7. Spondylosis without myelopathy or  radiculopathy, lumbar region   8. Spondylolisthesis of lumbar region     Plan: Findings:  1. Chronic worsening severe neck pain with bilateral shoulder pain left more than right with numbness tingling and paresthesias in a nondermatomal fashion down both arms with perceived weakness. Exam shows myofascial trigger points are reproduce some of her pain. She has some mild impingement of the left shoulder. She has good strength bilaterally without any deficits. She has 2+ reflexes. I think at this point though given the MRI findings in the past and her worry and complaints of worsening pain and weakness we did update the MRI of the cervical spine. If she has worsening cervical stenosis I think she does need to see Dr. Lorin Mercy in follow-up or as she has mentioned possibly a neurosurgeon. I'm not sure I have much in the way to offer in terms of interventional procedure other than what we've tried in the past which is been facet joint block. I think a lot of her symptoms here are multifactorial. I do think the fibromyalgia is an underlying problem that does affect the perception of her pain and paresthesia. I would not recommend chiropractic manipulation of her cervical spine.  2. Her lumbar spine does not look nearly as bad on the MRI as her cervical spine. On exam she has mostly pain with extension rotation. A lot of her pain is facet joint mediated. She has a new listhesis on the 2017 MRI and pretty significant gaping facet joints. Prior facet joint block gave her quite a bit relief. I would repeat the facet joint block and look at potentially radiofrequency ablation of those facet joints and we discussed this. Again I think the underlying fibromyalgia and myofascial pain is an issue.  From a medication standpoint she's really tried a lot of different medications in the past. This does not represent a chronic opioid condition at least not one that would be good for her health. I do think she could use Tylenol as  a scheduled medication sedative as needed and I think that would help her quite a bit. I think she could take Advil like she is doing which is 800 mg every now and then. She may want to supplement this with Zantac or even a proton pump inhibitor because it does seem to give her some gastritis. Alternative medications could be tried and she has tried those in the past including gabapentin and Lyrica and Septra. I spent more than 25 minutes speaking face-to-face with the patient with 50% of the time in counseling.    Meds & Orders: No orders of the defined types were placed in this encounter.   Orders Placed This Encounter  Procedures  . MR CERVICAL SPINE W WO CONTRAST    Follow-up: Return for MRI review after completion and bilateral medial branch blocks lumbar.   Procedures: No procedures performed  No notes on file   Clinical History: TECHNIQUE: Multiplanar, multisequence MR imaging of the cervical spine was performed without contrast 12/282016  COMPARISON: None  INDICATION: Neck pain, left UE pain, HNP, stenosis  FINDINGS: .  Bones:Small remote appearing C7 superior endplate compression fracture . Spinal cord: Normal size, contour, and signal without evidence of mass or demyelinating lesion . Alignment: Anatomic without subluxation. . Degenerative changes:  . C1-C2: No significant central canal or neural foraminal stenosis. . C2-C3: No significant central canal or neural foraminal stenosis. . C3-C4: Severe left facet arthrosis with periarticular marrow and soft tissue edema. 5 mm anterior subluxation. Moderate central canal stenosis. Moderately severe left foraminal stenosis. Marland Kitchen C4-C5: Disc space narrowing. Broad-based disc osteophyte complex. Effacement of the ventral aspect of thecal sac with contact of the ventral spinal cord. Moderate central canal and moderately severe right foraminal stenosis . C5-C6: ACDF with removal of hardware. No residual or recurrent stenosis. Marland Kitchen C6-C7:  Broad-based disc osteophyte complex. Mild central canal and bilateral foraminal stenosis. . C7-T1: No significant central canal or neural foraminal stenosis. . T1-2: Small severely migrating central disc extrusion producing mild central canal stenosis. . Additional findings: 1 cm left thyroid nodule    IMPRESSION:  Moderate central canal and right foraminal stenosis C4-5 above the level of fusion secondary to degenerative disc disease.  Small remote T7 compression fracture.  Small disc extrusion T1-2 with mild central stenosis.  Facet arthrosis on the left at C3-4 with periarticular marrow and soft tissue edema consistent with a degree of inflammation or instability. This is also likely a source of localized neck pain and/or limited range of motion. Osteophytes and synovitis  encroach on the left neural foramen and exiting left C4 nerve root.  1 cm left thyroid nodule  MRI LUMBAR SPINE WITHOUT CONTRAST 05/23/2015  TECHNIQUE: Multiplanar, multisequence MR imaging of the lumbar spine was performed. No intravenous contrast was administered.  COMPARISON:  [facility] Lumbar MRI 12/08/2008.  FINDINGS: Same numbering system as in 2010 designating normal lumbar segmentation. Since that time grade 1 anterolisthesis has developed at L4-L5. Minimal levoconvex lumbar scoliosis also may be new. Otherwise stable vertebral height and alignment. There is mild degenerative appearing marrow edema at the bilateral L4 and right L5 pedicles, corresponding to levels of advanced facet degeneration as detailed below. No other acute osseous abnormality identified.  Visualized lower thoracic spinal cord is normal with conus medularis at L1-L2. Visualized abdominal viscera and paraspinal soft tissues are within normal limits.  T11-T12:  Negative.  T12-L1:  Negative.  L1-L2:  Minimal disc bulge, otherwise negative.  L2-L3: Subtle left paracentral disc protrusion is  new, best seen on series 4, image 8. Stable mild facet hypertrophy with trace facet joint fluid. No stenosis.  L3-L4: Interval disc space loss. Increased chronic circumferential disc bulge. Increased moderate facet and ligament flavum hypertrophy. New mild bilateral lateral recess stenosis. Increased bilateral foraminal disc greater on the left. Increased mild to moderate left foraminal stenosis.  L4-L5: Grade 1 anterolisthesis now with severe facet and ligament flavum hypertrophy which has significantly progressed since 2010. Capacious facet joints containing fluid. Right eccentric broad-based disc/ pseudo disc. Still, only mild spinal and borderline to mild lateral recess stenosis occurs. There is new mild to moderate right L4 foraminal stenosis, in part related to foraminal disc.  L5-S1: Chronic severe disc space loss. Chronic right eccentric circumferential disc osteophyte complex is stable. Mild facet hypertrophy is stable. No spinal or lateral recess stenosis. Stable borderline to mild right L5 foraminal stenosis.  IMPRESSION: 1. Grade 1 spondylolisthesis with severe facet arthropathy has developed at L4-L5 since 2010. Bilateral L4 and right L5 posterior element marrow edema appears degenerative in nature and is compatible with acute  exacerbation of the chronic facet joint arthritis. 2. L4-L5 disc degeneration also has progressed and contributes to new mild spinal stenosis and mild to moderate right foraminal stenosis at that level. 3. Progressed disc and facet degeneration also at L3-L4 with new mild bilateral lateral recess stenosis and mild to moderate left foraminal stenosis. 4. Stable chronic disc and endplate degeneration at L5-S1. 5. New subtle disc degeneration at L1-L2 and L2-L3 without neural impingement.  She reports that she has quit smoking. Her smoking use included Cigarettes. She has never used smokeless tobacco. No results for input(s): HGBA1C, LABURIC  in the last 8760 hours.  Objective:  VS:  HT:    WT:   BMI:     BP:136/77  HR:66bpm  TEMP: ( )  RESP:  Physical Exam  Constitutional: She is oriented to person, place, and time. She appears well-developed and well-nourished. No distress.  HENT:  Head: Normocephalic and atraumatic.  Nose: Nose normal.  Mouth/Throat: Oropharynx is clear and moist.  Eyes: Conjunctivae are normal. Pupils are equal, round, and reactive to light.  Neck: Neck supple.  Cardiovascular: Regular rhythm and intact distal pulses.   Pulmonary/Chest: Effort normal. No respiratory distress.  Abdominal: She exhibits no distension. There is no guarding.  Musculoskeletal:  Examination of the cervical spine shows forward flexed cervical spine with pain upon rotation right more than left. She has more pain with forward flexion left more than right. She has active trigger points in the levator scapular trapezius and rhomboid. She does have mild impingement left shoulder with external rotation compared to the right. She has had a history of prior shoulder surgery I believe. She has 5 out of 5 strength in all muscle groups of the upper extremities bilaterally. This includes wrist extension long finger flexion and abduction. She has 2+ muscle stretch reflexes at the biceps and brachioradialis. Maybe a slight decrease left more than right biceps. She has a negative Spurling's test as well as negative Hoffmann's test.  Lumbar spine exam shows pain with extension rotation. No pain over the greater trochanters. No pain with hip rotation. She has no edema of the bilateral knee joints there is good varus and valgus stability. There is no joint line tenderness. She has good distal strength without clonus.  Lymphadenopathy:    She has cervical adenopathy.  Neurological: She is alert and oriented to person, place, and time. She exhibits normal muscle tone. Coordination normal.  Skin: Skin is warm. No rash noted. No erythema.  Psychiatric:  She has a normal mood and affect. Her behavior is normal.  Nursing note and vitals reviewed.   Ortho Exam Imaging: No results found.  Past Medical/Family/Surgical/Social History: Medications & Allergies reviewed per EMR Patient Active Problem List   Diagnosis Date Noted  . Closed fracture of right proximal humerus 01/08/2013    Class: Acute   Past Medical History:  Diagnosis Date  . Anxiety   . Cancer ([place])    Hx: of squamouscell carcinoma on Left shin basal cell on face  . Complication of anesthesia    - 2006- after R arm surgery,pt. reports that "had a hard time getting me back" after using/being given Morphine , states she required sternal rubs & narcan  . Depression   . Fibromyalgia   . GERD (gastroesophageal reflux disease)    Hx: of  . H/O hiatal hernia    PMH: of  . Headache(784.0)   . Hypertension   . IBS (irritable bowel syndrome)    Hx: of  .  Osteopenia    Hx: of per pt, pt. adds osteoporosis- DOS  . PONV (postoperative nausea and vomiting)   . Sleep apnea    Hx: of in the past   Family History  Problem Relation Age of Onset  . Heart disease Other   . Lung disease Other    Past Surgical History:  Procedure Laterality Date  . APPENDECTOMY    . ARCUATE KERATECTOMY    . BREAST LUMPECTOMY    . CERVICAL FUSION    . FRACTURE SURGERY    . HERNIA REPAIR    . NICEN FUNDO PLACATION    . ORIF HUMERUS FRACTURE Right 01/08/2013   Procedure: OPEN REDUCTION INTERNAL FIXATION (ORIF) PROXIMAL HUMERUS FRACTURE;  Surgeon: Marybelle Killings, MD;  Location: [facility];  Service: Orthopedics;  Laterality: Right;  Open Reduction Internal Fixation Right Proximal Humerus Fracture  . RADIAL HEAD REPLACEMENT    . tubular plasity     Social History   Occupational History  . Not on file.   Social History Main Topics  . Smoking status: Former Smoker    Types: Cigarettes  . Smokeless tobacco: Never Used     Comment: Quit 1982"  . Alcohol use Yes  . Drug use: No  . Sexual  activity: Not on file

## 2016-08-21 NOTE — Telephone Encounter (Signed)
Faxed auth form and last office note to [facility] 

## 2016-08-23 DIAGNOSIS — D485 Neoplasm of uncertain behavior of skin: Secondary | ICD-10-CM | POA: Diagnosis not present

## 2016-08-23 DIAGNOSIS — L282 Other prurigo: Secondary | ICD-10-CM | POA: Diagnosis not present

## 2016-08-23 DIAGNOSIS — L739 Follicular disorder, unspecified: Secondary | ICD-10-CM | POA: Diagnosis not present

## 2016-09-02 ENCOUNTER — Ambulatory Visit
Admission: RE | Admit: 2016-09-02 | Discharge: 2016-09-02 | Disposition: A | Discharge status: Home / Self Care | Payer: PPO | Source: Ambulatory Visit | Attending: Physical Medicine and Rehabilitation | Admitting: Physical Medicine and Rehabilitation

## 2016-09-02 DIAGNOSIS — M5412 Radiculopathy, cervical region: Secondary | ICD-10-CM

## 2016-09-02 DIAGNOSIS — M4802 Spinal stenosis, cervical region: Secondary | ICD-10-CM | POA: Diagnosis not present

## 2016-09-02 DIAGNOSIS — M542 Cervicalgia: Secondary | ICD-10-CM

## 2016-09-02 DIAGNOSIS — M961 Postlaminectomy syndrome, not elsewhere classified: Secondary | ICD-10-CM

## 2016-09-02 MED ORDER — GADOBENATE DIMEGLUMINE 529 MG/ML IV SOLN
17.0000 mL | Freq: Once | INTRAVENOUS | Status: AC | PRN
  Administered 2016-09-02: 17 mL via INTRAVENOUS

## 2016-09-04 NOTE — Telephone Encounter (Signed)
Received auth for [id number]. eff 08/21/16-11/19/16. GPQD#[id number].

## 2016-09-05 ENCOUNTER — Encounter (INDEPENDENT_AMBULATORY_CARE_PROVIDER_SITE_OTHER): Payer: Self-pay | Admitting: Physical Medicine and Rehabilitation

## 2016-09-05 ENCOUNTER — Ambulatory Visit (INDEPENDENT_AMBULATORY_CARE_PROVIDER_SITE_OTHER): Payer: Self-pay

## 2016-09-05 ENCOUNTER — Ambulatory Visit (INDEPENDENT_AMBULATORY_CARE_PROVIDER_SITE_OTHER): Payer: PPO | Admitting: Physical Medicine and Rehabilitation

## 2016-09-05 VITALS — BP 145/76 | HR 85

## 2016-09-05 DIAGNOSIS — M5412 Radiculopathy, cervical region: Secondary | ICD-10-CM | POA: Diagnosis not present

## 2016-09-05 DIAGNOSIS — M797 Fibromyalgia: Secondary | ICD-10-CM

## 2016-09-05 DIAGNOSIS — M961 Postlaminectomy syndrome, not elsewhere classified: Secondary | ICD-10-CM | POA: Diagnosis not present

## 2016-09-05 DIAGNOSIS — G8929 Other chronic pain: Secondary | ICD-10-CM | POA: Diagnosis not present

## 2016-09-05 DIAGNOSIS — M47816 Spondylosis without myelopathy or radiculopathy, lumbar region: Secondary | ICD-10-CM

## 2016-09-05 DIAGNOSIS — M545 Low back pain, unspecified: Secondary | ICD-10-CM

## 2016-09-05 MED ORDER — BUPIVACAINE HCL 0.5 % IJ SOLN
3.0000 mL | Freq: Once | INTRAMUSCULAR | Status: AC
  Administered 2016-09-05: 3 mL

## 2016-09-05 MED ORDER — LIDOCAINE HCL (PF) 1 % IJ SOLN
2.0000 mL | Freq: Once | INTRAMUSCULAR | Status: AC
  Administered 2016-09-05: 2 mL

## 2016-09-05 NOTE — Patient Instructions (Signed)

## 2016-09-05 NOTE — Progress Notes (Deleted)
Patient is here today for planned bilateral medial branch blocks. No change in symptoms.

## 2016-09-07 ENCOUNTER — Encounter (INDEPENDENT_AMBULATORY_CARE_PROVIDER_SITE_OTHER): Payer: Self-pay | Admitting: Physical Medicine and Rehabilitation

## 2016-09-07 ENCOUNTER — Telehealth (INDEPENDENT_AMBULATORY_CARE_PROVIDER_SITE_OTHER): Payer: Self-pay | Admitting: Physical Medicine and Rehabilitation

## 2016-09-07 NOTE — Progress Notes (Signed)
VIOLETA LECOUNT - 71 y.o. female MRN [id number]  Date of birth: Sep 10, 1945  Office Visit Note: Visit Date: 09/05/2016 PCP: Cari Caraway, MD Referred by: Cari Caraway, MD  Subjective: Chief Complaint  Patient presents with  . Lower Back - Pain  . Neck - Pain   HPI: Mrs. Martinique is a 71 year old female that we recently seen in evaluation and management for her neck pain and back pain. Please see his prior notes for further evaluation. She comes in today for MRI review of her cervical spine. This is reviewed in length below but basically shows similar findings 2 2011 with worsening stenosis at C4-5 centrally above her fusion which is at C5-6. She has reversal of the normal lordosis and arthritic changes above the fusion. A lot of the edema that was seen in the prior MRI has resolved. She clearly has significant stenosis above the fusion but without cord signal changes. In terms of her pain at this point she continues to have neck and shoulder pain some pain radiating into the arms and symptoms in the hands. She has good strength hour on exam. In terms of her low back pain she still continues to have axial low back pain worse with standing and extension. She spelled conservative care including therapy and medication management. We are point to complete bilateral L4-5 facet joint blocks as well as L3-4. We will given her pain diary and ultimately we would look at radiofrequency ablation if this is successful. We would do a double block paradigm. We would over this at great length today I did speak with her for over 20 minutes.    Review of Systems  Constitutional: Negative for chills, fever, malaise/fatigue and weight loss.  HENT: Negative for hearing loss and sinus pain.   Eyes: Negative for blurred vision, double vision and photophobia.  Respiratory: Negative for cough and shortness of breath.   Cardiovascular: Negative for chest pain, palpitations and leg swelling.  Gastrointestinal: Negative  for abdominal pain, nausea and vomiting.  Genitourinary: Negative for flank pain.  Musculoskeletal: Positive for back pain and neck pain. Negative for myalgias.  Skin: Negative for itching and rash.  Neurological: Positive for tingling. Negative for tremors, focal weakness and weakness.  Endo/Heme/Allergies: Negative.   Psychiatric/Behavioral: Negative for depression.  All other systems reviewed and are negative.  Otherwise per HPI.  Assessment & Plan: Visit Diagnoses:  1. Spondylosis without myelopathy or radiculopathy, lumbar region   2. Chronic bilateral low back pain without sciatica   3. Cervical radiculopathy   4. Post laminectomy syndrome   5. Fibromyalgia     Plan: Findings:  Chronic low back pain worse with standing and extension consistent with facet mediated pain. MRI evidence of facet arthropathy with listhesis without radicular complaints or stenosis. Within a complete diagnostic medial branch blocks of L3-4 and L4-5 facet joints with a pain diary. She would make a good candidate for radiofrequency ablation should the double block medial branches show positive results.  From a cervical spine standpoint she is going to seek neurosurgical evaluation for her cervical spine. At this point she has failed epidural injection and other injections around the cervical spine and I think she really is at a point where she needs to consider potential surgical procedure.    Meds & Orders:  Meds ordered this encounter  Medications  . lidocaine (PF) (XYLOCAINE) 1 % injection 2 mL  . bupivacaine (MARCAINE) 0.5 % (with pres) injection 3 mL    Orders Placed This  Encounter  Procedures  . Facet Injection  . XR C-ARM NO REPORT    Follow-up: Return if symptoms worsen or fail to improve, for second medial branch block.   Procedures: No procedures performed  Lumbar Diagnostic Facet Joint Nerve Block with Fluoroscopic Guidance   Patient: JALASIA ESKRIDGE      Date of Birth: Jan 04, 1946 MRN:  [id number] PCP: Cari Caraway, MD      Visit Date: 09/05/2016   Universal Protocol:    Date/Time: 07/12/185:50 AM  Consent Given By: the patient  Position: PRONE  Additional Comments: Vital signs were monitored before and after the procedure. Patient was prepped and draped in the usual sterile fashion. The correct patient, procedure, and site was verified.   Injection Procedure Details:  Procedure Site One Meds Administered:  Meds ordered this encounter  Medications  . lidocaine (PF) (XYLOCAINE) 1 % injection 2 mL  . bupivacaine (MARCAINE) 0.5 % (with pres) injection 3 mL     Laterality: Bilateral  Location/Site:  L3-L4 L4-L5  Needle size: 22 G  Needle type: spinal needle  Needle Placement: Oblique pedical  Findings:  -Contrast Used: 1 mL iohexol 180 mg iodine/mL   -Comments: It was excellent flow of contrast along the articular pillars without intravascular flow.  Procedure Details: The fluoroscope beam is vertically oriented in AP and then obliqued 15 to 20 degrees to the ipsilateral side of the desired nerve to achieve the "Scotty dog" appearance.  The skin over the target area of the junction of the superior articulating process and the transverse process (sacral ala if blocking the L5 dorsal rami) was locally anesthetized with a 1 ml volume of 1% Lidocaine without Epinephrine.  The spinal needle was inserted and advanced in a trajectory view down to the target.   After contact with periosteum and negative aspirate for blood and CSF, correct placement without intravascular or epidural spread was confirmed by injecting 0.5 ml. of Isovue-250.  A spot radiograph was obtained of this image.    Next, a 0.5 ml. volume of the injectate described above was injected. The needle was then redirected to the other facet joint nerves mentioned above if needed.  Prior to the procedure, the patient was given a Pain Diary which was completed for baseline measurements.  After the  procedure, the patient rated their pain every 30 minutes and will continue rating at this frequency for a total of 5 hours.  The patient has been asked to complete the Diary and return to [place] by mail, fax or hand delivered as soon as possible.   Additional Comments:  The patient tolerated the procedure well No complications occurred Dressing: Band-Aid    Post-procedure details: Patient was observed during the procedure. Post-procedure instructions were reviewed.  Patient left the clinic in stable condition.     Clinical History: MRI CERVICAL SPINE WITHOUT AND WITH CONTRAST 09/02/2016   TECHNIQUE: Multiplanar and multiecho pulse sequences of the cervical spine, to include the craniocervical junction and cervicothoracic junction, were obtained without and with intravenous contrast.  CONTRAST:  69mL MULTIHANCE GADOBENATE DIMEGLUMINE 529 MG/ML IV SOLN  COMPARISON:  11/09/2009  FINDINGS: Alignment: Reversal of cervical lordosis. Facet mediated anterolisthesis at C2-3, C7-T1 and T1-2, up to 3 mm.  Vertebrae: Marrow edema and enhancement about the left C3-4 facet, which has joint effusion and subchondral cysts. Previously seen marrow edema at C4-5 is resolved. No acute fracture, discitis, or aggressive bone lesion. Remote and healed C7 superior endplate fracture.  Cord: Normal signal and  morphology.  Posterior Fossa, vertebral arteries, paraspinal tissues: No acute finding. Chronic gliotic type signal in the left middle cerebellar peduncle. 1 cm nodule in the left thyroid, likely incidental at this size.  Disc levels:  C2-3: Facet arthropathy with slight anterolisthesis.  C3-4: Facet arthropathy, advanced on the left as described above. Slight anterolisthesis. The disc is circumferentially bulging. Moderate left foraminal stenosis. No cord impingement  C4-5: Advanced degenerative disc narrowing. There is posterior disc osteophyte complex causing spinal  stenosis with mild ventral cord flattening. The foramina are patent  C5-6: ACDF with solid interbody arthrodesis. No residual impingement  C6-7: Disc degeneration with mild ridging. Negative facets. Patent canal and foramina.  C7-T1:Facet arthropathy with anterolisthesis causing mild left foraminal narrowing. No herniation.  IMPRESSION: 1. C3-4 left facet arthritis with marrow edema. C4-5 facet arthritis on prior MRI has resolved. Elsewhere, no notable changes when compared to 2011. 2. C3-4 moderate left foraminal stenosis 3. C4-5 spinal stenosis with mild ventral cord flattening. 4. C5-6 ACDF with solid arthrodesis and no residual impingement.  She reports that she has quit smoking. Her smoking use included Cigarettes. She has never used smokeless tobacco. No results for input(s): HGBA1C, LABURIC in the last 8760 hours.  Objective:  VS:  HT:    WT:   BMI:     BP:(!) 145/76  HR:85bpm  TEMP: ( )  RESP:96 % Physical Exam  Constitutional: She is oriented to person, place, and time. She appears well-developed and well-nourished.  Eyes: Pupils are equal, round, and reactive to light. Conjunctivae and EOM are normal.  Neck: No tracheal deviation present.  Patient has forward flexed cervical spine with pain on extension rotation but negative Spurling's test. She has good upper extremity strength.  Cardiovascular: Normal rate and intact distal pulses.   Pulmonary/Chest: Effort normal.  Musculoskeletal:  Patient ambulates without aid she has pain with extension rotation of the lumbar spine. She has some tenderness across the lower back and paraspinal musculature. Some tenderness over the greater trochanter but good distal strength.  Lymphadenopathy:    She has no cervical adenopathy.  Neurological: She is alert and oriented to person, place, and time. She exhibits normal muscle tone.  Skin: Skin is warm and dry. No rash noted. No erythema.  Psychiatric: She has a normal mood and  affect. Her behavior is normal.  Nursing note and vitals reviewed.   Ortho Exam Imaging: No results found.  Past Medical/Family/Surgical/Social History: Medications & Allergies reviewed per EMR Patient Active Problem List   Diagnosis Date Noted  . Closed fracture of right proximal humerus 01/08/2013    Class: Acute   Past Medical History:  Diagnosis Date  . Anxiety   . Cancer ([place])    Hx: of squamouscell carcinoma on Left shin basal cell on face  . Complication of anesthesia    - 2006- after R arm surgery,pt. reports that "had a hard time getting me back" after using/being given Morphine , states she required sternal rubs & narcan  . Depression   . Fibromyalgia   . GERD (gastroesophageal reflux disease)    Hx: of  . H/O hiatal hernia    PMH: of  . Headache(784.0)   . Hypertension   . IBS (irritable bowel syndrome)    Hx: of  . Osteopenia    Hx: of per pt, pt. adds osteoporosis- DOS  . PONV (postoperative nausea and vomiting)   . Sleep apnea    Hx: of in the past   Family History  Problem Relation Age of Onset  . Heart disease Other   . Lung disease Other    Past Surgical History:  Procedure Laterality Date  . APPENDECTOMY    . ARCUATE KERATECTOMY    . BREAST LUMPECTOMY    . CERVICAL FUSION    . FRACTURE SURGERY    . HERNIA REPAIR    . NICEN FUNDO PLACATION    . ORIF HUMERUS FRACTURE Right 01/08/2013   Procedure: OPEN REDUCTION INTERNAL FIXATION (ORIF) PROXIMAL HUMERUS FRACTURE;  Surgeon: Marybelle Killings, MD;  Location: [facility];  Service: Orthopedics;  Laterality: Right;  Open Reduction Internal Fixation Right Proximal Humerus Fracture  . RADIAL HEAD REPLACEMENT    . tubular plasity     Social History   Occupational History  . Not on file.   Social History Main Topics  . Smoking status: Former Smoker    Types: Cigarettes  . Smokeless tobacco: Never Used     Comment: Quit 1982"  . Alcohol use Yes  . Drug use: No  . Sexual activity: Not on file

## 2016-09-07 NOTE — Telephone Encounter (Signed)
Submitted auth request on [employer]TA website 

## 2016-09-07 NOTE — Procedures (Signed)
Lumbar Diagnostic Facet Joint Nerve Block with Fluoroscopic Guidance   Patient: [name]      Date of Birth: [birth year] MRN: [id number] PCP: Cari Caraway, MD      Visit Date: 09/05/2016   Universal Protocol:    Date/Time: 07/12/185:50 AM  Consent Given By: the patient  Position: PRONE  Additional Comments: Vital signs were monitored before and after the procedure. Patient was prepped and draped in the usual sterile fashion. The correct patient, procedure, and site was verified.   Injection Procedure Details:  Procedure Site One Meds Administered:  Meds ordered this encounter  Medications  . lidocaine (PF) (XYLOCAINE) 1 % injection 2 mL  . bupivacaine (MARCAINE) 0.5 % (with pres) injection 3 mL     Laterality: Bilateral  Location/Site:  L3-L4 L4-L5  Needle size: 22 G  Needle type: spinal needle  Needle Placement: Oblique pedical  Findings:  -Contrast Used: 1 mL iohexol 180 mg iodine/mL   -Comments: It was excellent flow of contrast along the articular pillars without intravascular flow.  Procedure Details: The fluoroscope beam is vertically oriented in AP and then obliqued 15 to 20 degrees to the ipsilateral side of the desired nerve to achieve the "Scotty dog" appearance.  The skin over the target area of the junction of the superior articulating process and the transverse process (sacral ala if blocking the L5 dorsal rami) was locally anesthetized with a 1 ml volume of 1% Lidocaine without Epinephrine.  The spinal needle was inserted and advanced in a trajectory view down to the target.   After contact with periosteum and negative aspirate for blood and CSF, correct placement without intravascular or epidural spread was confirmed by injecting 0.5 ml. of Isovue-250.  A spot radiograph was obtained of this image.    Next, a 0.5 ml. volume of the injectate described above was injected. The needle was then redirected to the other facet joint nerves mentioned above  if needed.  Prior to the procedure, the patient was given a Pain Diary which was completed for baseline measurements.  After the procedure, the patient rated their pain every 30 minutes and will continue rating at this frequency for a total of 5 hours.  The patient has been asked to complete the Diary and return to [place] by mail, fax or hand delivered as soon as possible.   Additional Comments:  The patient tolerated the procedure well No complications occurred Dressing: Band-Aid    Post-procedure details: Patient was observed during the procedure. Post-procedure instructions were reviewed.  Patient left the clinic in stable condition.

## 2016-09-11 NOTE — Telephone Encounter (Signed)
Pending HTA auth

## 2016-09-15 NOTE — Telephone Encounter (Signed)
Received auth per HTA website. [id number] for [id number] x 2 and 63943 x2. eff 09/07/16-12/06/16. Pt already scheduled.

## 2016-09-19 ENCOUNTER — Ambulatory Visit (INDEPENDENT_AMBULATORY_CARE_PROVIDER_SITE_OTHER): Payer: PPO | Admitting: Physical Medicine and Rehabilitation

## 2016-09-19 ENCOUNTER — Ambulatory Visit (INDEPENDENT_AMBULATORY_CARE_PROVIDER_SITE_OTHER): Payer: PPO

## 2016-09-19 ENCOUNTER — Encounter (INDEPENDENT_AMBULATORY_CARE_PROVIDER_SITE_OTHER): Payer: Self-pay | Admitting: Physical Medicine and Rehabilitation

## 2016-09-19 VITALS — BP 148/83 | HR 77

## 2016-09-19 DIAGNOSIS — G8929 Other chronic pain: Secondary | ICD-10-CM

## 2016-09-19 DIAGNOSIS — M47816 Spondylosis without myelopathy or radiculopathy, lumbar region: Secondary | ICD-10-CM

## 2016-09-19 DIAGNOSIS — M545 Low back pain, unspecified: Secondary | ICD-10-CM

## 2016-09-19 MED ORDER — BUPIVACAINE HCL 0.5 % IJ SOLN
3.0000 mL | Freq: Once | INTRAMUSCULAR | Status: AC
  Administered 2016-09-19: 3 mL

## 2016-09-19 MED ORDER — LIDOCAINE HCL (PF) 1 % IJ SOLN
2.0000 mL | Freq: Once | INTRAMUSCULAR | Status: AC
  Administered 2016-09-19: 2 mL

## 2016-09-19 NOTE — Patient Instructions (Signed)

## 2016-09-19 NOTE — Progress Notes (Deleted)
Patient is here today for repeat medial branch blocks.

## 2016-09-19 NOTE — Progress Notes (Unsigned)
Fluoro Time: 50 sec MGy: 35.48

## 2016-09-20 NOTE — Procedures (Signed)
Ms. [name] is a 71 year old female with chronic worsening axial low back pain and her history is been thoroughly reviewed in our prior notes. She has successfully completed medial branch blocks of the bilateral L2, L3 and L4 medial branches to effectively blocked the L3-4 and L4-5 facet joints. The first diagnostic block was diagnostic of the pain diary has been documented in the chart. She reports tremendous relief for 3 hours and then slow return of symptoms after that. She felt like it was the most back pain relief she's had quite a while. We are repeating the second block today in a double block paradigm. Depending on these results would look at radiofrequency ablation. Lumbar Diagnostic Facet Joint Nerve Block with Fluoroscopic Guidance   Patient: Bonnie Juarez      Date of Birth: 16-Mar-1945 MRN: [id number] PCP: Cari Caraway, MD      Visit Date: 09/19/2016   Universal Protocol:    Date/Time: 07/25/186:12 AM  Consent Given By: the patient  Position: PRONE  Additional Comments: Vital signs were monitored before and after the procedure. Patient was prepped and draped in the usual sterile fashion. The correct patient, procedure, and site was verified.   Injection Procedure Details:  Procedure Site One Meds Administered:  Meds ordered this encounter  Medications  . lidocaine (PF) (XYLOCAINE) 1 % injection 2 mL  . bupivacaine (MARCAINE) 0.5 % (with pres) injection 3 mL     Laterality: Bilateral  Location/Site: L2, L3 and L4 medial branches L3-L4 L4-L5  Needle size: 22 G  Needle type: spinal needle  Needle Placement: Oblique pedical  Findings:  -Contrast Used: 1 mL iohexol 180 mg iodine/mL   -Comments: It was excellent flow of contrast along the articular pillars without intravascular flow.  Procedure Details: The fluoroscope beam is vertically oriented in AP and then obliqued 15 to 20 degrees to the ipsilateral side of the desired nerve to achieve the "Scotty dog"  appearance.  The skin over the target area of the junction of the superior articulating process and the transverse process (sacral ala if blocking the L5 dorsal rami) was locally anesthetized with a 1 ml volume of 1% Lidocaine without Epinephrine.  The spinal needle was inserted and advanced in a trajectory view down to the target.   After contact with periosteum and negative aspirate for blood and CSF, correct placement without intravascular or epidural spread was confirmed by injecting 0.5 ml. of Isovue-250.  A spot radiograph was obtained of this image.    Next, a 0.5 ml. volume of the injectate described above was injected. The needle was then redirected to the other facet joint nerves mentioned above if needed.  Prior to the procedure, the patient was given a Pain Diary which was completed for baseline measurements.  After the procedure, the patient rated their pain every 30 minutes and will continue rating at this frequency for a total of 5 hours.  The patient has been asked to complete the Diary and return to [place] by mail, fax or hand delivered as soon as possible.   Additional Comments:  The patient tolerated the procedure well Dressing: Band-Aid    Post-procedure details: Patient was observed during the procedure. Post-procedure instructions were reviewed.  Patient left the clinic in stable condition.

## 2016-09-22 DIAGNOSIS — Z0000 Encounter for general adult medical examination without abnormal findings: Secondary | ICD-10-CM | POA: Diagnosis not present

## 2016-09-22 DIAGNOSIS — M810 Age-related osteoporosis without current pathological fracture: Secondary | ICD-10-CM | POA: Diagnosis not present

## 2016-09-22 DIAGNOSIS — M797 Fibromyalgia: Secondary | ICD-10-CM | POA: Diagnosis not present

## 2016-09-22 DIAGNOSIS — F419 Anxiety disorder, unspecified: Secondary | ICD-10-CM | POA: Diagnosis not present

## 2016-09-22 DIAGNOSIS — G479 Sleep disorder, unspecified: Secondary | ICD-10-CM | POA: Diagnosis not present

## 2016-09-22 DIAGNOSIS — F3341 Major depressive disorder, recurrent, in partial remission: Secondary | ICD-10-CM | POA: Diagnosis not present

## 2016-09-22 DIAGNOSIS — I10 Essential (primary) hypertension: Secondary | ICD-10-CM | POA: Diagnosis not present

## 2016-09-22 DIAGNOSIS — J309 Allergic rhinitis, unspecified: Secondary | ICD-10-CM | POA: Diagnosis not present

## 2016-09-22 DIAGNOSIS — R7301 Impaired fasting glucose: Secondary | ICD-10-CM | POA: Diagnosis not present

## 2016-09-22 DIAGNOSIS — K219 Gastro-esophageal reflux disease without esophagitis: Secondary | ICD-10-CM | POA: Diagnosis not present

## 2016-09-22 DIAGNOSIS — E782 Mixed hyperlipidemia: Secondary | ICD-10-CM | POA: Diagnosis not present

## 2016-09-22 DIAGNOSIS — N3941 Urge incontinence: Secondary | ICD-10-CM | POA: Diagnosis not present

## 2016-10-04 ENCOUNTER — Ambulatory Visit (INDEPENDENT_AMBULATORY_CARE_PROVIDER_SITE_OTHER): Payer: PPO | Admitting: Physical Medicine and Rehabilitation

## 2016-10-04 DIAGNOSIS — G8929 Other chronic pain: Secondary | ICD-10-CM

## 2016-10-04 DIAGNOSIS — M545 Low back pain, unspecified: Secondary | ICD-10-CM

## 2016-10-04 DIAGNOSIS — M797 Fibromyalgia: Secondary | ICD-10-CM

## 2016-10-04 DIAGNOSIS — M47816 Spondylosis without myelopathy or radiculopathy, lumbar region: Secondary | ICD-10-CM

## 2016-10-05 ENCOUNTER — Encounter (INDEPENDENT_AMBULATORY_CARE_PROVIDER_SITE_OTHER): Payer: Self-pay | Admitting: Physical Medicine and Rehabilitation

## 2016-10-05 ENCOUNTER — Ambulatory Visit (INDEPENDENT_AMBULATORY_CARE_PROVIDER_SITE_OTHER): Payer: PPO

## 2016-10-05 DIAGNOSIS — M545 Low back pain, unspecified: Secondary | ICD-10-CM | POA: Insufficient documentation

## 2016-10-05 DIAGNOSIS — M797 Fibromyalgia: Secondary | ICD-10-CM | POA: Insufficient documentation

## 2016-10-05 DIAGNOSIS — G8929 Other chronic pain: Secondary | ICD-10-CM | POA: Insufficient documentation

## 2016-10-05 DIAGNOSIS — M47816 Spondylosis without myelopathy or radiculopathy, lumbar region: Secondary | ICD-10-CM

## 2016-10-05 MED ORDER — LIDOCAINE HCL (PF) 1 % IJ SOLN
2.0000 mL | Freq: Once | INTRAMUSCULAR | Status: AC
Start: 2016-10-05 — End: 2016-10-05
  Administered 2016-10-05: 2 mL

## 2016-10-05 MED ORDER — METHYLPREDNISOLONE ACETATE 80 MG/ML IJ SUSP
80.0000 mg | Freq: Once | INTRAMUSCULAR | Status: AC
  Administered 2016-10-05: 80 mg

## 2016-10-05 NOTE — Patient Instructions (Signed)

## 2016-10-05 NOTE — Procedures (Signed)
[name] is a 71 year old female with chronic severe axial low back pain right more than left. Worse with extension in standing better at rest and sitting. Consistent with facet mediated pain. She has undergone double diagnostic blocks with pain diary showing excellent relief during the anesthetic phase. She's had physical therapy and this is documented. She's had chronic pain for quite a while. She does suffer from fibromyalgia. She does report to me that she is seeing a neurosurgeon about looking at her cervical spine. She has had prior ACDF. We are going to perform right-sided radiofrequency ablation of the L4-5 and L5-S1 facet joints. Please note that epic was down this afternoon minutes a lot of the dates and times for the procedure and medications will be wrong.  Lumbar Facet Joint Nerve Denervation  Patient: Bonnie Juarez      Date of Birth: 02/09/1946 MRN: [id number] PCP: Cari Caraway, MD      Visit Date: 10/04/2016   Universal Protocol:    Date/Time: 08/09/186:01 AM  Consent Given By: the patient  Position: PRONE  Additional Comments: Vital signs were monitored before and after the procedure. Patient was prepped and draped in the usual sterile fashion. The correct patient, procedure, and site was verified.   Injection Procedure Details:  Procedure Site One Meds Administered:  Meds ordered this encounter  Medications  . lidocaine (PF) (XYLOCAINE) 1 % injection 2 mL  . methylPREDNISolone acetate (DEPO-MEDROL) injection 80 mg     Laterality: Right  Location/Site: Right L3 and L4 medial branches and right L5 dorsal rami L4-L5 L5-S1  Needle size: 18 G  Needle type: Radiofrequency cannula  Needle Placement: Along juncture of superior articular process and transverse pocess  Findings:  -Comments:  Procedure Details: For each desired target nerve, the corresponding transverse process (sacral ala for the L5 dorsal rami) was identified and the fluoroscope was  positioned to square off the endplates of the corresponding vertebral body to achieve a true AP midline view.  The beam was then obliqued 15 to 20 degrees and caudally tilted 15 to 20 degrees to line up a trajectory along the target nerves. The skin over the target of the junction of superior articulating process and transverse process (sacral ala for the L5 dorsal rami) was infiltrated with 61ml of 1% Lidocaine without Epinephrine.  The 18 gauge 31mm active tip outer cannula was advanced in trajectory view to the target.  This procedure was repeated for each target nerve.  Then, for all levels, the outer cannula placement was fine-tuned and the position was then confirmed with bi-planar imaging.    Test stimulation was done both at sensory and motor levels to ensure there was no radicular stimulation. The target tissues were then infiltrated with 1 ml of 1% Lidocaine without Epinephrine. Subsequently, a percutaneous neurotomy was carried out for 60 seconds at 80 degrees Celsius. The procedure was repeated with the cannula rotated 90 degrees, for duration of 60 seconds, one additional time at each level for a total of two lesions per level.  After the completion of the two lesions, 1 ml of injectate was delivered. It was then repeated for each facet joint nerve mentioned above. Appropriate radiographs were obtained to verify the probe placement during the neurotomy.   Additional Comments:  The patient tolerated the procedure well Dressing: Band-Aid    Post-procedure details: Patient was observed during the procedure. Post-procedure instructions were reviewed.  Patient left the clinic in stable condition.

## 2016-10-05 NOTE — Progress Notes (Unsigned)
Fluoro Time: 1  Min 5 sec MGy: 60.77

## 2016-10-11 ENCOUNTER — Encounter (INDEPENDENT_AMBULATORY_CARE_PROVIDER_SITE_OTHER): Payer: Self-pay | Admitting: Physical Medicine and Rehabilitation

## 2016-10-11 ENCOUNTER — Ambulatory Visit (INDEPENDENT_AMBULATORY_CARE_PROVIDER_SITE_OTHER): Payer: PPO | Admitting: Physical Medicine and Rehabilitation

## 2016-10-11 ENCOUNTER — Ambulatory Visit (INDEPENDENT_AMBULATORY_CARE_PROVIDER_SITE_OTHER): Payer: Self-pay

## 2016-10-11 VITALS — BP 133/78 | HR 88

## 2016-10-11 DIAGNOSIS — M545 Low back pain, unspecified: Secondary | ICD-10-CM

## 2016-10-11 DIAGNOSIS — M47816 Spondylosis without myelopathy or radiculopathy, lumbar region: Secondary | ICD-10-CM

## 2016-10-11 DIAGNOSIS — G8929 Other chronic pain: Secondary | ICD-10-CM | POA: Diagnosis not present

## 2016-10-11 MED ORDER — METHYLPREDNISOLONE ACETATE 80 MG/ML IJ SUSP
80.0000 mg | Freq: Once | INTRAMUSCULAR | Status: AC
  Administered 2016-10-11: 80 mg

## 2016-10-11 MED ORDER — LIDOCAINE HCL (PF) 1 % IJ SOLN
2.0000 mL | Freq: Once | INTRAMUSCULAR | Status: AC
  Administered 2016-10-11: 2 mL

## 2016-10-11 NOTE — Patient Instructions (Signed)

## 2016-10-11 NOTE — Progress Notes (Unsigned)
Fluoro Time: 1 min 19 sec Mgy: 62.86

## 2016-10-11 NOTE — Progress Notes (Deleted)
Patient is here for left side RFA.

## 2016-10-13 NOTE — Procedures (Signed)
Bonnie Juarez is a pleasant 71 year old female with chronic worsening axial low back pain which is been diagnosed as facet mediated low back pain to a large degree with double diagnostic medial branch blocks. She's had physical therapy and medication management has been dealing with this for years. Cases collocated by fibromyalgia. We recently completed radiofrequency ablation of the right L3-4 and L4-5 facet joints. She reports are ready that she feels much better on the right side. We are going to do this on the left today. Please see our prior notes for further justification and details. Lumbar Facet Joint Nerve Denervation  Patient: Bonnie Juarez      Date of Birth: 05-May-1945 MRN: [id number] PCP: Cari Caraway, MD      Visit Date: 10/11/2016   Universal Protocol:    Date/Time: 08/17/186:20 AM  Consent Given By: the patient  Position: PRONE  Additional Comments: Vital signs were monitored before and after the procedure. Patient was prepped and draped in the usual sterile fashion. The correct patient, procedure, and site was verified.   Injection Procedure Details:  Procedure Site One Meds Administered:  Meds ordered this encounter  Medications  . lidocaine (PF) (XYLOCAINE) 1 % injection 2 mL  . methylPREDNISolone acetate (DEPO-MEDROL) injection 80 mg     Laterality: Left  Location/Site: Left L2, L3 and L4 medial branches L3-L4 L4-L5  Needle size: 18 G  Needle type: Radiofrequency cannula  Needle Placement: Along juncture of superior articular process and transverse pocess  Findings:  -Comments:  Procedure Details: For each desired target nerve, the corresponding transverse process (sacral ala for the L5 dorsal rami) was identified and the fluoroscope was positioned to square off the endplates of the corresponding vertebral body to achieve a true AP midline view.  The beam was then obliqued 15 to 20 degrees and caudally tilted 15 to 20 degrees to line up a trajectory  along the target nerves. The skin over the target of the junction of superior articulating process and transverse process (sacral ala for the L5 dorsal rami) was infiltrated with 80ml of 1% Lidocaine without Epinephrine.  The 18 gauge 45mm active tip outer cannula was advanced in trajectory view to the target.  This procedure was repeated for each target nerve.  Then, for all levels, the outer cannula placement was fine-tuned and the position was then confirmed with bi-planar imaging.    Test stimulation was done both at sensory and motor levels to ensure there was no radicular stimulation. The target tissues were then infiltrated with 1 ml of 1% Lidocaine without Epinephrine. Subsequently, a percutaneous neurotomy was carried out for 60 seconds at 80 degrees Celsius. The procedure was repeated with the cannula rotated 90 degrees, for duration of 60 seconds, one additional time at each level for a total of two lesions per level.  After the completion of the two lesions, 1 ml of injectate was delivered. It was then repeated for each facet joint nerve mentioned above. Appropriate radiographs were obtained to verify the probe placement during the neurotomy.   Additional Comments:  The patient tolerated the procedure well Dressing: Band-Aid    Post-procedure details: Patient was observed during the procedure. Post-procedure instructions were reviewed.  Patient left the clinic in stable condition.

## 2016-10-17 ENCOUNTER — Other Ambulatory Visit: Payer: Self-pay | Admitting: Neurological Surgery

## 2016-10-17 DIAGNOSIS — M542 Cervicalgia: Secondary | ICD-10-CM | POA: Diagnosis not present

## 2016-10-17 DIAGNOSIS — M810 Age-related osteoporosis without current pathological fracture: Secondary | ICD-10-CM

## 2016-10-19 ENCOUNTER — Other Ambulatory Visit: Payer: Self-pay | Admitting: Neurological Surgery

## 2016-10-19 DIAGNOSIS — G5603 Carpal tunnel syndrome, bilateral upper limbs: Secondary | ICD-10-CM | POA: Diagnosis not present

## 2016-10-19 DIAGNOSIS — M961 Postlaminectomy syndrome, not elsewhere classified: Secondary | ICD-10-CM | POA: Diagnosis not present

## 2016-10-19 DIAGNOSIS — M9981 Other biomechanical lesions of cervical region: Secondary | ICD-10-CM | POA: Diagnosis not present

## 2016-10-19 DIAGNOSIS — R32 Unspecified urinary incontinence: Secondary | ICD-10-CM | POA: Diagnosis not present

## 2016-10-19 DIAGNOSIS — M542 Cervicalgia: Secondary | ICD-10-CM

## 2016-10-26 ENCOUNTER — Other Ambulatory Visit: Payer: PPO

## 2016-10-26 ENCOUNTER — Ambulatory Visit
Admission: RE | Admit: 2016-10-26 | Discharge: 2016-10-26 | Disposition: A | Discharge status: Home / Self Care | Payer: PPO | Source: Ambulatory Visit | Attending: Neurological Surgery | Admitting: Neurological Surgery

## 2016-10-26 DIAGNOSIS — M4802 Spinal stenosis, cervical region: Secondary | ICD-10-CM | POA: Diagnosis not present

## 2016-10-26 DIAGNOSIS — M542 Cervicalgia: Secondary | ICD-10-CM

## 2016-11-01 DIAGNOSIS — Z1211 Encounter for screening for malignant neoplasm of colon: Secondary | ICD-10-CM | POA: Diagnosis not present

## 2016-11-01 DIAGNOSIS — K5730 Diverticulosis of large intestine without perforation or abscess without bleeding: Secondary | ICD-10-CM | POA: Diagnosis not present

## 2016-11-01 DIAGNOSIS — K640 First degree hemorrhoids: Secondary | ICD-10-CM | POA: Diagnosis not present

## 2016-11-02 DIAGNOSIS — H2513 Age-related nuclear cataract, bilateral: Secondary | ICD-10-CM | POA: Diagnosis not present

## 2016-11-08 ENCOUNTER — Ambulatory Visit (INDEPENDENT_AMBULATORY_CARE_PROVIDER_SITE_OTHER): Payer: PPO | Admitting: Physical Medicine and Rehabilitation

## 2016-11-08 ENCOUNTER — Encounter (INDEPENDENT_AMBULATORY_CARE_PROVIDER_SITE_OTHER): Payer: Self-pay | Admitting: Physical Medicine and Rehabilitation

## 2016-11-08 VITALS — BP 120/81 | HR 79

## 2016-11-08 DIAGNOSIS — M797 Fibromyalgia: Secondary | ICD-10-CM | POA: Diagnosis not present

## 2016-11-08 DIAGNOSIS — M545 Low back pain, unspecified: Secondary | ICD-10-CM

## 2016-11-08 DIAGNOSIS — M47816 Spondylosis without myelopathy or radiculopathy, lumbar region: Secondary | ICD-10-CM | POA: Diagnosis not present

## 2016-11-08 DIAGNOSIS — G8929 Other chronic pain: Secondary | ICD-10-CM

## 2016-11-08 NOTE — Progress Notes (Deleted)
Post RFA. Had more pain after second than the first. Has done well, but pain is not totally gone. Sometimes has increased pain at night, but reports overall relief of about 75%.

## 2016-11-09 ENCOUNTER — Encounter (INDEPENDENT_AMBULATORY_CARE_PROVIDER_SITE_OTHER): Payer: Self-pay | Admitting: Physical Medicine and Rehabilitation

## 2016-11-09 NOTE — Progress Notes (Signed)
Bonnie Juarez - 71 y.o. female MRN [id number]  Date of birth: 19-Apr-1945  Office Visit Note: Visit Date: 11/08/2016 PCP: Cari Caraway, MD Referred by: Cari Caraway, MD  Subjective: Chief Complaint  Patient presents with  . Lower Back - Pain   HPI: Bonnie Juarez is a 71 year old female with lumbar spine spondylosis and chronic low back pain with underlying fibromyalgia. She is 4 weeks status post radiofrequency ablation of the lower facet joints. She reports over 75% relief of her back pain. She still gets one spot on the left but bothers her but doesn't really keep her from activities. She is very pleased with the results of far. She would've fluctuates expected 100% but sheis not possible. We also talked about the underlying fibromyalgia which is the still think she has even though she does have spinal spondylosis and at least in the cervical spine quite a bit of issues with prior ACDF and adjacent level disease. She is currently seeing Dr. Ronnald Ramp for evaluation of her cervical spine. She's had no new symptoms or paresthesias or radicular pain.    Review of Systems  Musculoskeletal: Positive for back pain.  All other systems reviewed and are negative.  Otherwise per HPI.  Assessment & Plan: Visit Diagnoses:  1. Spondylosis without myelopathy or radiculopathy, lumbar region   2. Chronic bilateral low back pain without sciatica   3. Fibromyalgia     Plan: Findings:  Successful radiofrequency ablation of the bilateral L3-4 and L4-5 facet joints with 75% relief of her low back pain. We'll continue to watch her oh expected to maybe even get a little bit better but I think at this point she is satisfied. Hopefully this will last quite a while. The underlying fibromyalgia is a little bit of the while cart here. She continues follow-up with Dr. Sherley Bounds for her cervical spine. We'll see her back as needed.    Meds & Orders: No orders of the defined types were placed in this encounter.  No orders of the defined types were placed in this encounter.   Follow-up: Return if symptoms worsen or fail to improve.   Procedures: No procedures performed  No notes on file   Clinical History: MRI CERVICAL SPINE WITHOUT AND WITH CONTRAST 09/02/2016   TECHNIQUE: Multiplanar and multiecho pulse sequences of the cervical spine, to include the craniocervical junction and cervicothoracic junction, were obtained without and with intravenous contrast.  CONTRAST:  85mL MULTIHANCE GADOBENATE DIMEGLUMINE 529 MG/ML IV SOLN  COMPARISON:  11/09/2009  FINDINGS: Alignment: Reversal of cervical lordosis. Facet mediated anterolisthesis at C2-3, C7-T1 and T1-2, up to 3 mm.  Vertebrae: Marrow edema and enhancement about the left C3-4 facet, which has joint effusion and subchondral cysts. Previously seen marrow edema at C4-5 is resolved. No acute fracture, discitis, or aggressive bone lesion. Remote and healed C7 superior endplate fracture.  Cord: Normal signal and morphology.  Posterior Fossa, vertebral arteries, paraspinal tissues: No acute finding. Chronic gliotic type signal in the left middle cerebellar peduncle. 1 cm nodule in the left thyroid, likely incidental at this size.  Disc levels:  C2-3: Facet arthropathy with slight anterolisthesis.  C3-4: Facet arthropathy, advanced on the left as described above. Slight anterolisthesis. The disc is circumferentially bulging. Moderate left foraminal stenosis. No cord impingement  C4-5: Advanced degenerative disc narrowing. There is posterior disc osteophyte complex causing spinal stenosis with mild ventral cord flattening. The foramina are patent  C5-6: ACDF with solid interbody arthrodesis. No residual impingement  C6-7: Disc  degeneration with mild ridging. Negative facets. Patent canal and foramina.  C7-T1:Facet arthropathy with anterolisthesis causing mild left foraminal narrowing. No  herniation.  IMPRESSION: 1. C3-4 left facet arthritis with marrow edema. C4-5 facet arthritis on prior MRI has resolved. Elsewhere, no notable changes when compared to 2011. 2. C3-4 moderate left foraminal stenosis 3. C4-5 spinal stenosis with mild ventral cord flattening. 4. C5-6 ACDF with solid arthrodesis and no residual impingement.  She reports that she has quit smoking. Her smoking use included Cigarettes. She has never used smokeless tobacco. No results for input(s): HGBA1C, LABURIC in the last 8760 hours.  Objective:  VS:  HT:    WT:   BMI:     BP:120/81  HR:79bpm  TEMP: ( )  RESP:  Physical Exam  Constitutional: She is oriented to person, place, and time. She appears well-developed and well-nourished.  Eyes: Pupils are equal, round, and reactive to light. Conjunctivae and EOM are normal.  Cardiovascular: Normal rate and intact distal pulses.   Pulmonary/Chest: Effort normal.  Musculoskeletal:  Patient ambulates without aid with good distal strength.  Neurological: She is alert and oriented to person, place, and time. She exhibits normal muscle tone.  Skin: Skin is warm and dry. No rash noted. No erythema.  Psychiatric: She has a normal mood and affect. Her behavior is normal.  Nursing note and vitals reviewed.   Ortho Exam Imaging: No results found.  Past Medical/Family/Surgical/Social History: Medications & Allergies reviewed per EMR Patient Active Problem List   Diagnosis Date Noted  . Spondylosis without myelopathy or radiculopathy, lumbar region 10/05/2016  . Chronic bilateral low back pain without sciatica 10/05/2016  . Fibromyalgia 10/05/2016  . Closed fracture of right proximal humerus 01/08/2013    Class: Acute   Past Medical History:  Diagnosis Date  . Anxiety   . Cancer ([facility])    Hx: of squamouscell carcinoma on Left shin basal cell on face  . Complication of anesthesia    - 2006- after R arm surgery,pt. reports that "had a hard time getting me  back" after using/being given Morphine , states she required sternal rubs & narcan  . Depression   . Fibromyalgia   . GERD (gastroesophageal reflux disease)    Hx: of  . H/O hiatal hernia    PMH: of  . Headache(784.0)   . Hypertension   . IBS (irritable bowel syndrome)    Hx: of  . Osteopenia    Hx: of per pt, pt. adds osteoporosis- DOS  . PONV (postoperative nausea and vomiting)   . Sleep apnea    Hx: of in the past   Family History  Problem Relation Age of Onset  . Heart disease Other   . Lung disease Other    Past Surgical History:  Procedure Laterality Date  . APPENDECTOMY    . ARCUATE KERATECTOMY    . BREAST LUMPECTOMY    . CERVICAL FUSION    . FRACTURE SURGERY    . HERNIA REPAIR    . NICEN FUNDO PLACATION    . ORIF HUMERUS FRACTURE Right 01/08/2013   Procedure: OPEN REDUCTION INTERNAL FIXATION (ORIF) PROXIMAL HUMERUS FRACTURE;  Surgeon: Marybelle Killings, MD;  Location: [facility];  Service: Orthopedics;  Laterality: Right;  Open Reduction Internal Fixation Right Proximal Humerus Fracture  . RADIAL HEAD REPLACEMENT    . tubular plasity     Social History   Occupational History  . Not on file.   Social History Main Topics  . Smoking status:  Former Smoker    Types: Cigarettes  . Smokeless tobacco: Never Used     Comment: Quit 1982"  . Alcohol use Yes  . Drug use: No  . Sexual activity: Not on file

## 2016-11-24 ENCOUNTER — Other Ambulatory Visit: Payer: Self-pay | Admitting: Neurological Surgery

## 2016-11-24 DIAGNOSIS — M542 Cervicalgia: Secondary | ICD-10-CM | POA: Diagnosis not present

## 2016-12-11 NOTE — Pre-Procedure Instructions (Signed)
Bonnie Juarez  12/11/2016      [id number] [place] [place] - [address] [place] [address] Phone: [phone] Fax: [phone]    Your procedure is scheduled on December 20, 2016.  Report to [facility] Admitting at 900 AM.  Call this number if you have problems the morning of surgery:  [phone]   Remember:  Do not eat food or drink liquids after midnight.  Take these medicines the morning of surgery with A SIP OF WATER fluticasone (flonase), lorazepam (ativan), myabefron ER (myrbetriq), solifenacin (vesicare), venlafaxine XR (Effexor-XR).   7 days prior to surgery STOP taking any Aspirin (unless otherwise instructed by your surgeon), Aleve, Naproxen, Ibuprofen, Motrin, Advil, Goody's, BC's, all herbal medications, fish oil, and all vitamins  Continue all other medications as instructed by your physician except follow the above medication instructions before surgery   Do not wear jewelry, make-up or nail polish.  Do not wear lotions, powders, or perfumes, or deoderant.  Do not shave 48 hours prior to surgery.    Do not bring valuables to the hospital.  [facility] is not responsible for any belongings or valuables.  Contacts, dentures or bridgework may not be worn into surgery.  Leave your suitcase in the car.  After surgery it may be brought to your room.  For patients admitted to the hospital, discharge time will be determined by your treatment team.  Patients discharged the day of surgery will not be allowed to drive home.    Special instructions:   [employer]- Preparing For Surgery  Before surgery, you can play an important role. Because skin is not sterile, your skin needs to be as free of germs as possible. You can reduce the number of germs on your skin by washing with CHG (chlorahexidine gluconate) Soap before surgery.  CHG is an antiseptic cleaner which kills germs and bonds with the skin to continue killing germs even  after washing.  Please do not use if you have an allergy to CHG or antibacterial soaps. If your skin becomes reddened/irritated stop using the CHG.  Do not shave (including legs and underarms) for at least 48 hours prior to first CHG shower. It is OK to shave your face.  Please follow these instructions carefully.   1. Shower the NIGHT BEFORE SURGERY and the MORNING OF SURGERY with CHG.   2. If you chose to wash your hair, wash your hair first as usual with your normal shampoo.  3. After you shampoo, rinse your hair and body thoroughly to remove the shampoo.  4. Use CHG as you would any other liquid soap. You can apply CHG directly to the skin and wash gently with a scrungie or a clean washcloth.   5. Apply the CHG Soap to your body ONLY FROM THE NECK DOWN.  Do not use on open wounds or open sores. Avoid contact with your eyes, ears, mouth and genitals (private parts). Wash Face and genitals (private parts)  with your normal soap.  6. Wash thoroughly, paying special attention to the area where your surgery will be performed.  7. Thoroughly rinse your body with warm water from the neck down.  8. DO NOT shower/wash with your normal soap after using and rinsing off the CHG Soap.  9. Pat yourself dry with a CLEAN TOWEL.  10. Wear CLEAN PAJAMAS to bed the night before surgery, wear comfortable clothes the morning of surgery  11. Place CLEAN SHEETS  on your bed the night of your first shower and DO NOT SLEEP WITH PETS.    Day of Surgery: Do not apply any deodorants/lotions. Please wear clean clothes to the hospital/surgery center.     Please read over the following fact sheets that you were given. Pain Booklet, Coughing and Deep Breathing, MRSA Information and Surgical Site Infection Prevention

## 2016-12-12 ENCOUNTER — Ambulatory Visit (HOSPITAL_COMMUNITY)
Admission: RE | Admit: 2016-12-12 | Discharge: 2016-12-12 | Disposition: A | Discharge status: Home / Self Care | Payer: PPO | Source: Ambulatory Visit | Attending: Neurological Surgery | Admitting: Neurological Surgery

## 2016-12-12 ENCOUNTER — Encounter (HOSPITAL_COMMUNITY): Payer: Self-pay

## 2016-12-12 ENCOUNTER — Encounter (HOSPITAL_COMMUNITY)
Admission: RE | Admit: 2016-12-12 | Discharge: 2016-12-12 | Disposition: A | Discharge status: Home / Self Care | Payer: PPO | Source: Ambulatory Visit | Attending: Neurological Surgery | Admitting: Neurological Surgery

## 2016-12-12 DIAGNOSIS — Z01810 Encounter for preprocedural cardiovascular examination: Secondary | ICD-10-CM | POA: Diagnosis not present

## 2016-12-12 DIAGNOSIS — F329 Major depressive disorder, single episode, unspecified: Secondary | ICD-10-CM | POA: Diagnosis not present

## 2016-12-12 DIAGNOSIS — I10 Essential (primary) hypertension: Secondary | ICD-10-CM | POA: Diagnosis not present

## 2016-12-12 DIAGNOSIS — Z87891 Personal history of nicotine dependence: Secondary | ICD-10-CM | POA: Insufficient documentation

## 2016-12-12 DIAGNOSIS — Z9889 Other specified postprocedural states: Secondary | ICD-10-CM | POA: Diagnosis not present

## 2016-12-12 DIAGNOSIS — M797 Fibromyalgia: Secondary | ICD-10-CM | POA: Insufficient documentation

## 2016-12-12 DIAGNOSIS — M4322 Fusion of spine, cervical region: Secondary | ICD-10-CM | POA: Diagnosis not present

## 2016-12-12 DIAGNOSIS — R9431 Abnormal electrocardiogram [ECG] [EKG]: Secondary | ICD-10-CM | POA: Diagnosis not present

## 2016-12-12 DIAGNOSIS — K589 Irritable bowel syndrome without diarrhea: Secondary | ICD-10-CM | POA: Diagnosis not present

## 2016-12-12 DIAGNOSIS — K469 Unspecified abdominal hernia without obstruction or gangrene: Secondary | ICD-10-CM | POA: Insufficient documentation

## 2016-12-12 DIAGNOSIS — Z01812 Encounter for preprocedural laboratory examination: Secondary | ICD-10-CM | POA: Insufficient documentation

## 2016-12-12 DIAGNOSIS — G4733 Obstructive sleep apnea (adult) (pediatric): Secondary | ICD-10-CM | POA: Insufficient documentation

## 2016-12-12 DIAGNOSIS — Z01818 Encounter for other preprocedural examination: Secondary | ICD-10-CM | POA: Diagnosis not present

## 2016-12-12 DIAGNOSIS — Z85828 Personal history of other malignant neoplasm of skin: Secondary | ICD-10-CM | POA: Diagnosis not present

## 2016-12-12 DIAGNOSIS — M542 Cervicalgia: Secondary | ICD-10-CM | POA: Diagnosis not present

## 2016-12-12 DIAGNOSIS — K219 Gastro-esophageal reflux disease without esophagitis: Secondary | ICD-10-CM | POA: Insufficient documentation

## 2016-12-12 DIAGNOSIS — F419 Anxiety disorder, unspecified: Secondary | ICD-10-CM | POA: Insufficient documentation

## 2016-12-12 DIAGNOSIS — Z8781 Personal history of (healed) traumatic fracture: Secondary | ICD-10-CM | POA: Insufficient documentation

## 2016-12-12 HISTORY — DX: Unspecified osteoarthritis, unspecified site: M19.90

## 2016-12-12 HISTORY — DX: Pneumonia, unspecified organism: J18.9

## 2016-12-12 LAB — BASIC METABOLIC PANEL
Anion gap: 7 (ref 5–15)
BUN: 16 mg/dL (ref 6–20)
CO2: 25 mmol/L (ref 22–32)
Calcium: 9.6 mg/dL (ref 8.9–10.3)
Chloride: 107 mmol/L (ref 101–111)
Creatinine, Ser: 0.84 mg/dL (ref 0.44–1.00)
GFR calc Af Amer: >60 mL/min (ref >60)
GFR calc non Af Amer: >60 mL/min (ref >60)
Glucose, Bld: 113 mg/dL — ABNORMAL HIGH (ref 65–99)
Potassium: 4.2 mmol/L (ref 3.5–5.1)
Sodium: 139 mmol/L (ref 135–145)

## 2016-12-12 LAB — TYPE AND SCREEN
ABO/RH(D): A POS
Antibody Screen: NEGATIVE

## 2016-12-12 LAB — CBC WITH DIFFERENTIAL/PLATELET
Basophils Absolute: 0 10*3/uL (ref 0.0–0.1)
Basophils Relative: 1 %
Eosinophils Absolute: 0.2 10*3/uL (ref 0.0–0.7)
Eosinophils Relative: 3 %
HCT: 39.1 % (ref 36.0–46.0)
Hemoglobin: 12.8 g/dL (ref 12.0–15.0)
Lymphocytes Relative: 46 %
Lymphs Abs: 4 10*3/uL (ref 0.7–4.0)
MCH: 29 pg (ref 26.0–34.0)
MCHC: 32.7 g/dL (ref 30.0–36.0)
MCV: 88.5 fL (ref 78.0–100.0)
Monocytes Absolute: 0.5 10*3/uL (ref 0.1–1.0)
Monocytes Relative: 6 %
Neutro Abs: 3.8 10*3/uL (ref 1.7–7.7)
Neutrophils Relative %: 44 %
Platelets: 299 10*3/uL (ref 150–400)
RBC: 4.42 MIL/uL (ref 3.87–5.11)
RDW: 13.4 % (ref 11.5–15.5)
WBC: 8.6 10*3/uL (ref 4.0–10.5)

## 2016-12-12 LAB — SURGICAL PCR SCREEN
MRSA, PCR: NEGATIVE
Staphylococcus aureus: POSITIVE — AB

## 2016-12-12 LAB — PROTIME-INR
INR: 0.95
Prothrombin Time: 12.6 seconds (ref 11.4–15.2)

## 2016-12-12 LAB — ABO/RH: ABO/RH(D): A POS

## 2016-12-12 NOTE — Pre-Procedure Instructions (Addendum)
Bonnie Juarez  12/12/2016      [id number] [place], [place] - [address] [place] [address] Phone: [phone] Fax: [phone]    Your procedure is scheduled on December 20, 2016.  Report to [facility] Admitting at 900 AM.  Call this number if you have problems the morning of surgery:  [phone]   Remember:  Do not eat food or drink liquids after midnight.  Take these medicines the morning of surgery with A SIP OF WATER fluticasone (flonase),if needed lorazepam (ativan), if needed venlafaxine XR (Effexor-XR).   Starting 12/12/16 today STOP taking any Aspirin (unless otherwise instructed by your surgeon), Aleve, Naproxen, Ibuprofen, Motrin, Advil, Goody's, BC's, all herbal medications, fish oil, and all vitamins,coq10,CBD oil  Continue all other medications as instructed by your physician except follow the above medication instructions before surgery   Do not wear jewelry, make-up or nail polish.  Do not wear lotions, powders, or perfumes, or deoderant.  Do not shave 48 hours prior to surgery.    Do not bring valuables to the hospital.  [facility] is not responsible for any belongings or valuables.  Contacts, dentures or bridgework may not be worn into surgery.  Leave your suitcase in the car.  After surgery it may be brought to your room.  For patients admitted to the hospital, discharge time will be determined by your treatment team.  Patients discharged the day of surgery will not be allowed to drive home.    Special instructions:   [facility]- Preparing For Surgery  Before surgery, you can play an important role. Because skin is not sterile, your skin needs to be as free of germs as possible. You can reduce the number of germs on your skin by washing with CHG (chlorahexidine gluconate) Soap before surgery.  CHG is an antiseptic cleaner which kills germs and bonds with the skin to continue killing germs even after  washing.  Please do not use if you have an allergy to CHG or antibacterial soaps. If your skin becomes reddened/irritated stop using the CHG.  Do not shave (including legs and underarms) for at least 48 hours prior to first CHG shower. It is OK to shave your face.  Please follow these instructions carefully.   1. Shower the NIGHT BEFORE SURGERY and the MORNING OF SURGERY with CHG.   2. If you chose to wash your hair, wash your hair first as usual with your normal shampoo.  3. After you shampoo, rinse your hair and body thoroughly to remove the shampoo.  4. Use CHG as you would any other liquid soap. You can apply CHG directly to the skin and wash gently with a scrungie or a clean washcloth.   5. Apply the CHG Soap to your body ONLY FROM THE NECK DOWN.  Do not use on open wounds or open sores. Avoid contact with your eyes, ears, mouth and genitals (private parts). Wash Face and genitals (private parts)  with your normal soap.  6. Wash thoroughly, paying special attention to the area where your surgery will be performed.  7. Thoroughly rinse your body with warm water from the neck down.  8. DO NOT shower/wash with your normal soap after using and rinsing off the CHG Soap.  9. Pat yourself dry with a CLEAN TOWEL.  10. Wear CLEAN PAJAMAS to bed the night before surgery, wear comfortable clothes the morning of surgery  11. Place CLEAN SHEETS on your bed  the night of your first shower and DO NOT SLEEP WITH PETS.    Day of Surgery: Do not apply any deodorants/lotions. Please wear clean clothes to the hospital/surgery center.     Please read over the  fact sheets that you were given.

## 2016-12-13 NOTE — Progress Notes (Addendum)
Anesthesia chart review:  Patient is a 71 year old female scheduled for posterior cervical fusion with lateral mass fixation C3-T1 on 12/20/16 by Dr. Sherley Bounds.  Medical history includes HTN, anxiety, depression, hiatal hernia, GERD, Nissen fundoplication, IBS, fibromyalgia, skin cancer ([facility]), OSA (reported f/u test did not show OSA), seasonal allergies, cervical fusion (3 level) '83, former smoker (quit '82), ORIF right humerus fracture 01/08/13.   Anesthesia history includes: - Post-operative N/V (did better with 2014 shoulder surgery), - After her 11/09/03 right radial head replacement surgery (done under general anesthesia) she awoke of anesthesia in significant pain and felt pain never got under control in the PACU either. She did receive additional medications once roomed, but when her husband arrived she was unresponsive to sternal rub. According the hospitalist consult, she received 29 mg of morphine sulfate and then Phenergan over a short-time course. She developed decreased LOC and hypoxia that responded to two ampules of Narcan. She required overnight pulse oximetry. (She did well with fentanyl with her last colonoscopy.)   - PCP is Dr. Theadore Nan.   - She was referred to cardiologist Dr. Fransico Him in August 2014 for chest pain and had a non ischemic stress test.  Cardiology follow-up is listed as PRN. (These records are scanned under Media tab, Correspondence, 01/08/13.) Patient says EGD ultimately showed what sounds to be severe gastritis.  Medication list includes Flonase, Ativan, losartan, Myrbetriq, fish oil, Vesicare, Forteo, Effexor XR.   BP 135/79   Pulse 90   Temp 36.7 C   Resp 18   Ht 5\' 6"  (1.676 m)   Wt 179 lb (81.2 kg)   SpO2 96%   BMI 28.89 kg/m   EKG 12/12/16: NSR, LAD, inferior infarct (age undetermined), possible anterolateral infarct (age undetermined). Inferior infarct changes present at least since 10/14/08. Low QRS voltage is new or more pronounced  (particularly V4-6) when compared to 09/03/12 tracing. No anterolateral Q waves seen.   Echo 10/30/12 [facility]): Mild concentric LVH, EF 70.7%, borderline LAE, trace MR, trivial TR, doppler findings suggestive of grade II diastolic dysfunction with elevated LA pressure.  Nuclear stress test 10/15/12 [facility]): Normal myocardial perfusion in all regions, post stress EF 85%, no regional wall motion abnormalities.  CXR 12/12/16: IMPRESSION: No acute cardiopulmonary disease.  CT c-spine 10/26/16: IMPRESSION: Severe multilevel spondylosis. Potentially symptomatic disc degeneration at multiple levels, particularly C3-4, C4-5, and C6-7. See discussion above. (See full report under Results Review tab.)  MRI c-spine 09/02/16: IMPRESSION: 1. C3-4 left facet arthritis with marrow edema. C4-5 facet arthritis on prior MRI has resolved. Elsewhere, no notable changes when compared to 2011. 2. C3-4 moderate left foraminal stenosis 3. C4-5 spinal stenosis with mild ventral cord flattening. 4. C5-6 ACDF with solid arthrodesis and no residual impingement.  Preoperative labs noted.   Reviewed EKGs with anesthesiologist Dr. Nolon Nations. If patient asymptomatic from a CV standpoint or recent (unexplained) changes in functional status then it is anticipated that she can proceed as planned. I called and spoke with patient. She denied chest pain. No SOB at rest or significant DOE. No conversational dyspnea. Occasional mild pedal edema with prolonged standing, but no significant edema. No known personal CAD/MI history. She tries to stay active--was going to a Health Clinic and working in her yard over the summer. 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  Since the summer, she has been less active due to her neck and back issue (not for cardiopulmonary  symptoms). Based on currently available information, I anticipate that she can proceed as planned if no acute changes.  [facility] Stay [facility]/Anesthesiology Phone [phone] 12/14/2016 11:37 AM

## 2016-12-14 ENCOUNTER — Encounter (HOSPITAL_COMMUNITY): Payer: Self-pay

## 2016-12-20 ENCOUNTER — Inpatient Hospital Stay (HOSPITAL_COMMUNITY): Payer: PPO | Admitting: Vascular Surgery

## 2016-12-20 ENCOUNTER — Inpatient Hospital Stay (HOSPITAL_COMMUNITY): Payer: PPO | Admitting: Certified Registered Nurse Anesthetist

## 2016-12-20 ENCOUNTER — Encounter (HOSPITAL_COMMUNITY)
Admission: RE | Disposition: A | Discharge status: Home / Self Care | Payer: Self-pay | Source: Ambulatory Visit | Attending: Neurological Surgery

## 2016-12-20 ENCOUNTER — Encounter (HOSPITAL_COMMUNITY): Payer: Self-pay | Admitting: Certified Registered Nurse Anesthetist

## 2016-12-20 ENCOUNTER — Inpatient Hospital Stay (HOSPITAL_COMMUNITY): Payer: PPO

## 2016-12-20 ENCOUNTER — Inpatient Hospital Stay (HOSPITAL_COMMUNITY)
Admission: RE | Admit: 2016-12-20 | Discharge: 2016-12-22 | DRG: 460 | Disposition: A | Discharge status: Home / Self Care | Payer: PPO | Source: Ambulatory Visit | Attending: Neurological Surgery | Admitting: Neurological Surgery

## 2016-12-20 DIAGNOSIS — Y838 Other surgical procedures as the cause of abnormal reaction of the patient, or of later complication, without mention of misadventure at the time of the procedure: Secondary | ICD-10-CM | POA: Diagnosis present

## 2016-12-20 DIAGNOSIS — Z981 Arthrodesis status: Secondary | ICD-10-CM | POA: Diagnosis not present

## 2016-12-20 DIAGNOSIS — M435X3 Other recurrent vertebral dislocation, cervicothoracic region: Secondary | ICD-10-CM | POA: Diagnosis present

## 2016-12-20 DIAGNOSIS — S13180A Subluxation of C7/T1 cervical vertebrae, initial encounter: Secondary | ICD-10-CM | POA: Diagnosis not present

## 2016-12-20 DIAGNOSIS — Z79899 Other long term (current) drug therapy: Secondary | ICD-10-CM

## 2016-12-20 DIAGNOSIS — Z22321 Carrier or suspected carrier of Methicillin susceptible Staphylococcus aureus: Secondary | ICD-10-CM

## 2016-12-20 DIAGNOSIS — M47812 Spondylosis without myelopathy or radiculopathy, cervical region: Secondary | ICD-10-CM | POA: Diagnosis not present

## 2016-12-20 DIAGNOSIS — M4803 Spinal stenosis, cervicothoracic region: Secondary | ICD-10-CM | POA: Diagnosis not present

## 2016-12-20 DIAGNOSIS — G4730 Sleep apnea, unspecified: Secondary | ICD-10-CM | POA: Diagnosis present

## 2016-12-20 DIAGNOSIS — M810 Age-related osteoporosis without current pathological fracture: Secondary | ICD-10-CM | POA: Diagnosis present

## 2016-12-20 DIAGNOSIS — F329 Major depressive disorder, single episode, unspecified: Secondary | ICD-10-CM | POA: Diagnosis not present

## 2016-12-20 DIAGNOSIS — M47893 Other spondylosis, cervicothoracic region: Principal | ICD-10-CM | POA: Diagnosis present

## 2016-12-20 DIAGNOSIS — Z888 Allergy status to other drugs, medicaments and biological substances status: Secondary | ICD-10-CM

## 2016-12-20 DIAGNOSIS — M960 Pseudarthrosis after fusion or arthrodesis: Secondary | ICD-10-CM | POA: Diagnosis not present

## 2016-12-20 DIAGNOSIS — F419 Anxiety disorder, unspecified: Secondary | ICD-10-CM | POA: Diagnosis not present

## 2016-12-20 DIAGNOSIS — M47816 Spondylosis without myelopathy or radiculopathy, lumbar region: Secondary | ICD-10-CM | POA: Diagnosis not present

## 2016-12-20 DIAGNOSIS — K219 Gastro-esophageal reflux disease without esophagitis: Secondary | ICD-10-CM | POA: Diagnosis present

## 2016-12-20 DIAGNOSIS — M4323 Fusion of spine, cervicothoracic region: Secondary | ICD-10-CM | POA: Diagnosis not present

## 2016-12-20 DIAGNOSIS — I10 Essential (primary) hypertension: Secondary | ICD-10-CM | POA: Diagnosis not present

## 2016-12-20 DIAGNOSIS — Z87891 Personal history of nicotine dependence: Secondary | ICD-10-CM | POA: Diagnosis not present

## 2016-12-20 DIAGNOSIS — M87812 Other osteonecrosis, left shoulder: Secondary | ICD-10-CM | POA: Diagnosis not present

## 2016-12-20 DIAGNOSIS — M1990 Unspecified osteoarthritis, unspecified site: Secondary | ICD-10-CM | POA: Diagnosis not present

## 2016-12-20 DIAGNOSIS — M545 Low back pain: Secondary | ICD-10-CM | POA: Diagnosis not present

## 2016-12-20 DIAGNOSIS — K589 Irritable bowel syndrome without diarrhea: Secondary | ICD-10-CM | POA: Diagnosis not present

## 2016-12-20 DIAGNOSIS — M797 Fibromyalgia: Secondary | ICD-10-CM | POA: Diagnosis not present

## 2016-12-20 DIAGNOSIS — Z419 Encounter for procedure for purposes other than remedying health state, unspecified: Secondary | ICD-10-CM

## 2016-12-20 DIAGNOSIS — Z85828 Personal history of other malignant neoplasm of skin: Secondary | ICD-10-CM

## 2016-12-20 DIAGNOSIS — M4693 Unspecified inflammatory spondylopathy, cervicothoracic region: Secondary | ICD-10-CM | POA: Diagnosis present

## 2016-12-20 DIAGNOSIS — M47813 Spondylosis without myelopathy or radiculopathy, cervicothoracic region: Secondary | ICD-10-CM | POA: Diagnosis not present

## 2016-12-20 HISTORY — PX: POSTERIOR CERVICAL FUSION/FORAMINOTOMY: SHX5038

## 2016-12-20 SURGERY — POSTERIOR CERVICAL FUSION/FORAMINOTOMY LEVEL 5
Anesthesia: General | Site: Spine Cervical

## 2016-12-20 MED ORDER — PHENYLEPHRINE HCL 10 MG/ML IJ SOLN
INTRAMUSCULAR | Status: DC | PRN
  Administered 2016-12-20 (×3): 80 ug via INTRAVENOUS

## 2016-12-20 MED ORDER — MIDAZOLAM HCL 5 MG/5ML IJ SOLN
INTRAMUSCULAR | Status: DC | PRN
  Administered 2016-12-20 (×2): 1 mg via INTRAVENOUS

## 2016-12-20 MED ORDER — 0.9 % SODIUM CHLORIDE (POUR BTL) OPTIME
TOPICAL | Status: DC | PRN
  Administered 2016-12-20: 1000 mL

## 2016-12-20 MED ORDER — PROPOFOL 10 MG/ML IV BOLUS
INTRAVENOUS | Status: DC | PRN
  Administered 2016-12-20: 50 mg via INTRAVENOUS
  Administered 2016-12-20: 100 mg via INTRAVENOUS
  Administered 2016-12-20: 20 mg via INTRAVENOUS

## 2016-12-20 MED ORDER — CEFAZOLIN SODIUM-DEXTROSE 2-4 GM/100ML-% IV SOLN
2.0000 g | Freq: Three times a day (TID) | INTRAVENOUS | Status: AC
  Administered 2016-12-20 – 2016-12-21 (×2): 2 g via INTRAVENOUS
  Filled 2016-12-20 (×2): qty 100

## 2016-12-20 MED ORDER — OXYCODONE HCL 5 MG PO TABS
5.0000 mg | ORAL_TABLET | ORAL | Status: DC | PRN
  Administered 2016-12-20 – 2016-12-21 (×3): 5 mg via ORAL
  Filled 2016-12-20 (×3): qty 1

## 2016-12-20 MED ORDER — MIDAZOLAM HCL 2 MG/2ML IJ SOLN
INTRAMUSCULAR | Status: AC
  Filled 2016-12-20: qty 2

## 2016-12-20 MED ORDER — FENTANYL CITRATE (PF) 100 MCG/2ML IJ SOLN
25.0000 ug | INTRAMUSCULAR | Status: AC | PRN
  Administered 2016-12-20 (×6): 25 ug via INTRAVENOUS

## 2016-12-20 MED ORDER — OXYCODONE HCL 5 MG PO TABS: 5.0000 mg | ORAL_TABLET | Freq: Once | ORAL | Status: DC | PRN

## 2016-12-20 MED ORDER — DEXAMETHASONE SODIUM PHOSPHATE 10 MG/ML IJ SOLN
INTRAMUSCULAR | Status: AC
  Filled 2016-12-20: qty 1

## 2016-12-20 MED ORDER — ONDANSETRON HCL 4 MG/2ML IJ SOLN
INTRAMUSCULAR | Status: DC | PRN
  Administered 2016-12-20: 4 mg via INTRAVENOUS

## 2016-12-20 MED ORDER — CELECOXIB 200 MG PO CAPS
200.0000 mg | ORAL_CAPSULE | Freq: Two times a day (BID) | ORAL | Status: DC
  Administered 2016-12-20 – 2016-12-22 (×4): 200 mg via ORAL
  Filled 2016-12-20 (×4): qty 1

## 2016-12-20 MED ORDER — MORPHINE SULFATE (PF) 4 MG/ML IV SOLN
2.0000 mg | INTRAVENOUS | Status: DC | PRN
  Administered 2016-12-20: 2 mg via INTRAVENOUS
  Filled 2016-12-20: qty 1

## 2016-12-20 MED ORDER — FENTANYL CITRATE (PF) 250 MCG/5ML IJ SOLN
INTRAMUSCULAR | Status: AC
  Filled 2016-12-20: qty 5

## 2016-12-20 MED ORDER — LOSARTAN POTASSIUM 50 MG PO TABS
50.0000 mg | ORAL_TABLET | Freq: Every day | ORAL | Status: DC
  Administered 2016-12-21 – 2016-12-22 (×2): 50 mg via ORAL
  Filled 2016-12-20 (×2): qty 1

## 2016-12-20 MED ORDER — ONDANSETRON HCL 4 MG/2ML IJ SOLN
4.0000 mg | Freq: Four times a day (QID) | INTRAMUSCULAR | Status: DC | PRN
  Administered 2016-12-20: 4 mg via INTRAVENOUS
  Filled 2016-12-20: qty 2

## 2016-12-20 MED ORDER — SENNA 8.6 MG PO TABS
1.0000 | ORAL_TABLET | Freq: Two times a day (BID) | ORAL | Status: DC
  Administered 2016-12-20 – 2016-12-22 (×4): 8.6 mg via ORAL
  Filled 2016-12-20 (×4): qty 1

## 2016-12-20 MED ORDER — PHENYLEPHRINE 40 MCG/ML (10ML) SYRINGE FOR IV PUSH (FOR BLOOD PRESSURE SUPPORT)
PREFILLED_SYRINGE | INTRAVENOUS | Status: AC
  Filled 2016-12-20: qty 10

## 2016-12-20 MED ORDER — VANCOMYCIN HCL 1000 MG IV SOLR
INTRAVENOUS | Status: DC | PRN
  Administered 2016-12-20: 1000 mg via TOPICAL

## 2016-12-20 MED ORDER — ACETAMINOPHEN 10 MG/ML IV SOLN
INTRAVENOUS | Status: AC
  Filled 2016-12-20: qty 100

## 2016-12-20 MED ORDER — SUGAMMADEX SODIUM 200 MG/2ML IV SOLN
INTRAVENOUS | Status: AC
  Filled 2016-12-20: qty 4

## 2016-12-20 MED ORDER — PROPOFOL 10 MG/ML IV BOLUS
INTRAVENOUS | Status: AC
  Filled 2016-12-20: qty 20

## 2016-12-20 MED ORDER — THROMBIN (RECOMBINANT) 5000 UNITS EX SOLR
OROMUCOSAL | Status: DC | PRN
  Administered 2016-12-20: 13:00:00 via TOPICAL

## 2016-12-20 MED ORDER — CHLORHEXIDINE GLUCONATE CLOTH 2 % EX PADS: 6.0000 | MEDICATED_PAD | Freq: Once | CUTANEOUS | Status: DC

## 2016-12-20 MED ORDER — THROMBIN (RECOMBINANT) 5000 UNITS EX SOLR
CUTANEOUS | Status: AC
  Filled 2016-12-20: qty 5000

## 2016-12-20 MED ORDER — SUCCINYLCHOLINE CHLORIDE 200 MG/10ML IV SOSY
PREFILLED_SYRINGE | INTRAVENOUS | Status: AC
  Filled 2016-12-20: qty 10

## 2016-12-20 MED ORDER — DEXAMETHASONE SODIUM PHOSPHATE 10 MG/ML IJ SOLN
10.0000 mg | INTRAMUSCULAR | Status: AC
  Administered 2016-12-20: 10 mg via INTRAVENOUS
  Filled 2016-12-20: qty 1

## 2016-12-20 MED ORDER — POTASSIUM CHLORIDE IN NACL 20-0.9 MEQ/L-% IV SOLN: INTRAVENOUS | Status: DC

## 2016-12-20 MED ORDER — SUGAMMADEX SODIUM 200 MG/2ML IV SOLN
INTRAVENOUS | Status: DC | PRN
  Administered 2016-12-20: 200 mg via INTRAVENOUS

## 2016-12-20 MED ORDER — ONDANSETRON HCL 4 MG/2ML IJ SOLN
INTRAMUSCULAR | Status: AC
  Filled 2016-12-20: qty 2

## 2016-12-20 MED ORDER — ONDANSETRON HCL 4 MG PO TABS
4.0000 mg | ORAL_TABLET | Freq: Four times a day (QID) | ORAL | Status: DC | PRN
  Administered 2016-12-21: 4 mg via ORAL
  Filled 2016-12-20: qty 1

## 2016-12-20 MED ORDER — FENTANYL CITRATE (PF) 100 MCG/2ML IJ SOLN
INTRAMUSCULAR | Status: AC
  Filled 2016-12-20: qty 2

## 2016-12-20 MED ORDER — SODIUM CHLORIDE 0.9 % IR SOLN
Status: DC | PRN
  Administered 2016-12-20: 13:00:00

## 2016-12-20 MED ORDER — BUPIVACAINE HCL (PF) 0.25 % IJ SOLN
INTRAMUSCULAR | Status: DC | PRN
  Administered 2016-12-20: 20 mL

## 2016-12-20 MED ORDER — METHOCARBAMOL 1000 MG/10ML IJ SOLN: 500.0000 mg | Freq: Four times a day (QID) | INTRAVENOUS | Status: DC | PRN

## 2016-12-20 MED ORDER — MENTHOL 3 MG MT LOZG
1.0000 | LOZENGE | OROMUCOSAL | Status: DC | PRN
Start: 2016-12-20 — End: 2016-12-22

## 2016-12-20 MED ORDER — SUCCINYLCHOLINE CHLORIDE 20 MG/ML IJ SOLN
INTRAMUSCULAR | Status: DC | PRN
  Administered 2016-12-20: 60 mg via INTRAVENOUS

## 2016-12-20 MED ORDER — ROCURONIUM BROMIDE 100 MG/10ML IV SOLN
INTRAVENOUS | Status: DC | PRN
  Administered 2016-12-20: 50 mg via INTRAVENOUS
  Administered 2016-12-20: 30 mg via INTRAVENOUS

## 2016-12-20 MED ORDER — ACETAMINOPHEN 325 MG PO TABS
650.0000 mg | ORAL_TABLET | ORAL | Status: DC | PRN
  Administered 2016-12-21: 650 mg via ORAL
  Filled 2016-12-20: qty 2

## 2016-12-20 MED ORDER — METHOCARBAMOL 500 MG PO TABS
500.0000 mg | ORAL_TABLET | Freq: Four times a day (QID) | ORAL | Status: DC | PRN
  Administered 2016-12-21 – 2016-12-22 (×6): 500 mg via ORAL
  Filled 2016-12-20 (×6): qty 1

## 2016-12-20 MED ORDER — DEXMEDETOMIDINE HCL 200 MCG/2ML IV SOLN
INTRAVENOUS | Status: DC | PRN
  Administered 2016-12-20 (×2): 40 ug via INTRAVENOUS

## 2016-12-20 MED ORDER — DEXMEDETOMIDINE HCL IN NACL 200 MCG/50ML IV SOLN
INTRAVENOUS | Status: AC
  Filled 2016-12-20: qty 50

## 2016-12-20 MED ORDER — LACTATED RINGERS IV SOLN
INTRAVENOUS | Status: DC
Start: 2016-12-20 — End: 2016-12-20
  Administered 2016-12-20 (×3): via INTRAVENOUS

## 2016-12-20 MED ORDER — LIDOCAINE 2% (20 MG/ML) 5 ML SYRINGE
INTRAMUSCULAR | Status: AC
  Filled 2016-12-20: qty 5

## 2016-12-20 MED ORDER — CEFAZOLIN SODIUM-DEXTROSE 2-4 GM/100ML-% IV SOLN
2.0000 g | INTRAVENOUS | Status: AC
  Administered 2016-12-20: 2 g via INTRAVENOUS
  Filled 2016-12-20: qty 100

## 2016-12-20 MED ORDER — MIRABEGRON ER 25 MG PO TB24
25.0000 mg | ORAL_TABLET | Freq: Every day | ORAL | Status: DC
  Administered 2016-12-21: 25 mg via ORAL
  Filled 2016-12-20 (×3): qty 1

## 2016-12-20 MED ORDER — BUPIVACAINE HCL (PF) 0.25 % IJ SOLN
INTRAMUSCULAR | Status: AC
  Filled 2016-12-20: qty 30

## 2016-12-20 MED ORDER — FENTANYL CITRATE (PF) 100 MCG/2ML IJ SOLN
INTRAMUSCULAR | Status: DC | PRN
  Administered 2016-12-20: 100 ug via INTRAVENOUS
  Administered 2016-12-20 (×4): 50 ug via INTRAVENOUS
  Administered 2016-12-20: 150 ug via INTRAVENOUS
  Administered 2016-12-20: 50 ug via INTRAVENOUS

## 2016-12-20 MED ORDER — VENLAFAXINE HCL ER 75 MG PO CP24
225.0000 mg | ORAL_CAPSULE | Freq: Every day | ORAL | Status: DC
  Administered 2016-12-21 – 2016-12-22 (×2): 225 mg via ORAL
  Filled 2016-12-20 (×2): qty 3

## 2016-12-20 MED ORDER — LIDOCAINE HCL (CARDIAC) 20 MG/ML IV SOLN
INTRAVENOUS | Status: DC | PRN
  Administered 2016-12-20: 60 mg via INTRAVENOUS

## 2016-12-20 MED ORDER — SODIUM CHLORIDE 0.9% FLUSH
3.0000 mL | Freq: Two times a day (BID) | INTRAVENOUS | Status: DC
  Administered 2016-12-21 (×2): 3 mL via INTRAVENOUS

## 2016-12-20 MED ORDER — LORAZEPAM 0.5 MG PO TABS
0.5000 mg | ORAL_TABLET | Freq: Three times a day (TID) | ORAL | Status: DC | PRN
  Administered 2016-12-20 – 2016-12-21 (×2): 0.5 mg via ORAL
  Filled 2016-12-20 (×2): qty 1

## 2016-12-20 MED ORDER — PHENOL 1.4 % MT LIQD: 1.0000 | OROMUCOSAL | Status: DC | PRN

## 2016-12-20 MED ORDER — BACITRACIN ZINC 500 UNIT/GM EX OINT
TOPICAL_OINTMENT | CUTANEOUS | Status: DC | PRN
  Administered 2016-12-20: 1 via TOPICAL

## 2016-12-20 MED ORDER — OXYCODONE HCL 5 MG/5ML PO SOLN: 5.0000 mg | Freq: Once | ORAL | Status: DC | PRN

## 2016-12-20 MED ORDER — ACETAMINOPHEN 160 MG/5ML PO SOLN: 325.0000 mg | ORAL | Status: DC | PRN

## 2016-12-20 MED ORDER — ACETAMINOPHEN 650 MG RE SUPP: 650.0000 mg | RECTAL | Status: DC | PRN

## 2016-12-20 MED ORDER — BACITRACIN ZINC 500 UNIT/GM EX OINT
TOPICAL_OINTMENT | CUTANEOUS | Status: AC
  Filled 2016-12-20: qty 28.35

## 2016-12-20 MED ORDER — SODIUM CHLORIDE 0.9% FLUSH: 3.0000 mL | INTRAVENOUS | Status: DC | PRN

## 2016-12-20 MED ORDER — VANCOMYCIN HCL 1000 MG IV SOLR
INTRAVENOUS | Status: AC
  Filled 2016-12-20: qty 1000

## 2016-12-20 MED ORDER — ESTROGENS, CONJUGATED 0.625 MG/GM VA CREA: 1.0000 g | TOPICAL_CREAM | VAGINAL | Status: DC | PRN

## 2016-12-20 MED ORDER — ACETAMINOPHEN 325 MG PO TABS: 325.0000 mg | ORAL_TABLET | ORAL | Status: DC | PRN

## 2016-12-20 MED ORDER — SUGAMMADEX SODIUM 200 MG/2ML IV SOLN
INTRAVENOUS | Status: AC
  Filled 2016-12-20: qty 2

## 2016-12-20 MED ORDER — ACETAMINOPHEN 10 MG/ML IV SOLN
INTRAVENOUS | Status: DC | PRN
  Administered 2016-12-20: 1000 mg via INTRAVENOUS

## 2016-12-20 SURGICAL SUPPLY — 68 items
ADH SKN CLS APL DERMABOND .7 (GAUZE/BANDAGES/DRESSINGS) ×1
APL SKNCLS STERI-STRIP NONHPOA (GAUZE/BANDAGES/DRESSINGS) ×1
BAG DECANTER FOR FLEXI CONT (MISCELLANEOUS) ×2 IMPLANT
BENZOIN TINCTURE PRP APPL 2/3 (GAUZE/BANDAGES/DRESSINGS) ×2 IMPLANT
BIT DRILL SCRW 3.5 (BIT) ×1 IMPLANT
BUR MATCHSTICK NEURO 3.0 LAGG (BURR) ×2 IMPLANT
CANISTER SUCT 3000ML PPV (MISCELLANEOUS) ×2 IMPLANT
CAP LOCKING (Cap) IMPLANT
CARTRIDGE OIL MAESTRO DRILL (MISCELLANEOUS) ×1 IMPLANT
DERMABOND ADVANCED (GAUZE/BANDAGES/DRESSINGS) ×1
DERMABOND ADVANCED .7 DNX12 (GAUZE/BANDAGES/DRESSINGS) IMPLANT
DIFFUSER DRILL AIR PNEUMATIC (MISCELLANEOUS) ×2 IMPLANT
DRAPE C-ARM 42X72 X-RAY (DRAPES) ×4 IMPLANT
DRAPE LAPAROTOMY 100X72 PEDS (DRAPES) ×2 IMPLANT
DRAPE POUCH INSTRU U-SHP 10X18 (DRAPES) ×2 IMPLANT
DRSG OPSITE POSTOP 4X8 (GAUZE/BANDAGES/DRESSINGS) ×1 IMPLANT
DURAPREP 6ML APPLICATOR 50/CS (WOUND CARE) ×2 IMPLANT
ELECT REM PT RETURN 9FT ADLT (ELECTROSURGICAL) ×2
ELECTRODE REM PT RTRN 9FT ADLT (ELECTROSURGICAL) ×1 IMPLANT
EVACUATOR 1/8 PVC DRAIN (DRAIN) ×1 IMPLANT
GAUZE SPONGE 4X4 12PLY STRL (GAUZE/BANDAGES/DRESSINGS) ×2 IMPLANT
GAUZE SPONGE 4X4 16PLY XRAY LF (GAUZE/BANDAGES/DRESSINGS) IMPLANT
GLOVE BIO SURGEON STRL SZ7 (GLOVE) ×1 IMPLANT
GLOVE BIO SURGEON STRL SZ8 (GLOVE) ×2 IMPLANT
GLOVE BIOGEL PI IND STRL 7.0 (GLOVE) IMPLANT
GLOVE BIOGEL PI IND STRL 7.5 (GLOVE) IMPLANT
GLOVE BIOGEL PI IND STRL 8 (GLOVE) IMPLANT
GLOVE BIOGEL PI INDICATOR 7.0 (GLOVE) ×1
GLOVE BIOGEL PI INDICATOR 7.5 (GLOVE) ×1
GLOVE BIOGEL PI INDICATOR 8 (GLOVE) ×3
GLOVE ECLIPSE 7.5 STRL STRAW (GLOVE) ×1 IMPLANT
GOWN STRL REUS W/ TWL LRG LVL3 (GOWN DISPOSABLE) IMPLANT
GOWN STRL REUS W/ TWL XL LVL3 (GOWN DISPOSABLE) ×1 IMPLANT
GOWN STRL REUS W/TWL 2XL LVL3 (GOWN DISPOSABLE) IMPLANT
GOWN STRL REUS W/TWL LRG LVL3 (GOWN DISPOSABLE) ×4
GOWN STRL REUS W/TWL XL LVL3 (GOWN DISPOSABLE) ×4
HEMOSTAT POWDER KIT SURGIFOAM (HEMOSTASIS) ×1 IMPLANT
IMPL QUARTEX 3.5X14MM (Neuro Prosthesis/Implant) IMPLANT
IMPLANT QUARTEX 3.5X14MM (Neuro Prosthesis/Implant) ×16 IMPLANT
KIT BASIN OR (CUSTOM PROCEDURE TRAY) ×2 IMPLANT
KIT ROOM TURNOVER OR (KITS) ×2 IMPLANT
LOCKING CAP (Cap) ×24 IMPLANT
MARKER SKIN DUAL TIP RULER LAB (MISCELLANEOUS) ×2 IMPLANT
NDL HYPO 18GX1.5 BLUNT FILL (NEEDLE) IMPLANT
NDL HYPO 25X1 1.5 SAFETY (NEEDLE) ×1 IMPLANT
NDL SPNL 20GX3.5 QUINCKE YW (NEEDLE) ×1 IMPLANT
NEEDLE HYPO 18GX1.5 BLUNT FILL (NEEDLE) IMPLANT
NEEDLE HYPO 25X1 1.5 SAFETY (NEEDLE) ×2 IMPLANT
NEEDLE SPNL 20GX3.5 QUINCKE YW (NEEDLE) ×2 IMPLANT
NS IRRIG 1000ML POUR BTL (IV SOLUTION) ×2 IMPLANT
OIL CARTRIDGE MAESTRO DRILL (MISCELLANEOUS) ×2
PACK LAMINECTOMY NEURO (CUSTOM PROCEDURE TRAY) ×2 IMPLANT
PIN MAYFIELD SKULL DISP (PIN) ×2 IMPLANT
PUTTY DBM ALLOSYNC PURE 5CC (Putty) ×2 IMPLANT
ROD STRAIGHT 120MMX4MM (Rod) ×2 IMPLANT
SCREW POLYAXIAL 4.0X22MM (Screw) ×2 IMPLANT
SCREW QUARTEX 4.0X24MM POLY (Screw) ×2 IMPLANT
SPONGE SURGIFOAM ABS GEL 100 (HEMOSTASIS) ×1 IMPLANT
STRIP CLOSURE SKIN 1/2X4 (GAUZE/BANDAGES/DRESSINGS) ×2 IMPLANT
SUT VIC AB 0 CT1 18XCR BRD8 (SUTURE) ×1 IMPLANT
SUT VIC AB 0 CT1 8-18 (SUTURE) ×2
SUT VIC AB 2-0 CP2 18 (SUTURE) ×2 IMPLANT
SUT VIC AB 3-0 SH 8-18 (SUTURE) ×2 IMPLANT
TOWEL GREEN STERILE (TOWEL DISPOSABLE) ×2 IMPLANT
TOWEL GREEN STERILE FF (TOWEL DISPOSABLE) ×2 IMPLANT
TRAY FOLEY W/METER SILVER 16FR (SET/KITS/TRAYS/PACK) ×1 IMPLANT
UNDERPAD 30X30 (UNDERPADS AND DIAPERS) ×1 IMPLANT
WATER STERILE IRR 1000ML POUR (IV SOLUTION) ×2 IMPLANT

## 2016-12-20 NOTE — Progress Notes (Signed)
Orthopedic Tech Progress Note Patient Details:  Bonnie Juarez 1945/05/09 [id number]  Ortho Devices Type of Ortho Device: Soft collar Ortho Device/Splint Location: neck Ortho Device/Splint Interventions: Ordered, Application   Braulio Bosch 12/20/2016, 7:28 PM

## 2016-12-20 NOTE — Anesthesia Procedure Notes (Signed)
Procedure Name: Intubation Date/Time: 12/20/2016 11:32 AM Performed by: Greggory Stallion, Kaylani Fromme L Pre-anesthesia Checklist: Patient identified, Emergency Drugs available, Suction available and Patient being monitored Patient Re-evaluated:Patient Re-evaluated prior to induction Oxygen Delivery Method: Circle System Utilized Preoxygenation: Pre-oxygenation with 100% oxygen Induction Type: IV induction Ventilation: Mask ventilation without difficulty Laryngoscope Size: Glidescope Grade View: Grade I Tube type: Oral Tube size: 7.5 mm Number of attempts: 1 Airway Equipment and Method: Stylet and Oral airway Placement Confirmation: ETT inserted through vocal cords under direct vision,  positive ETCO2 and breath sounds checked- equal and bilateral Secured at: 21 cm Tube secured with: Tape Dental Injury: Teeth and Oropharynx as per pre-operative assessment  Comments: Glide intubation due to significant TMJ

## 2016-12-20 NOTE — Anesthesia Preprocedure Evaluation (Signed)
Anesthesia Evaluation  Patient identified by MRN, date of birth, ID band Patient awake    Reviewed: Allergy & Precautions, NPO status , Patient's Chart, lab work & pertinent test results  History of Anesthesia Complications (+) PONV and history of anesthetic complications  Airway Mallampati: II  TM Distance: >3 FB Neck ROM: Full    Dental  (+) Teeth Intact   Pulmonary neg shortness of breath, sleep apnea , neg COPD, neg recent URI, former smoker,    breath sounds clear to auscultation       Cardiovascular hypertension, Pt. on medications (-) angina(-) Past MI and (-) CHF  Rhythm:Regular     Neuro/Psych  Headaches, PSYCHIATRIC DISORDERS Anxiety Depression  Neuromuscular disease    GI/Hepatic hiatal hernia, GERD  Controlled,  Endo/Other  negative endocrine ROS  Renal/GU negative Renal ROS     Musculoskeletal  (+) Arthritis , Fibromyalgia -  Abdominal   Peds  Hematology negative hematology ROS (+)   Anesthesia Other Findings   Reproductive/Obstetrics                             Anesthesia Physical Anesthesia Plan  ASA: II  Anesthesia Plan: General   Post-op Pain Management:    Induction: Intravenous  PONV Risk Score and Plan: 4 or greater and Ondansetron, Dexamethasone, Midazolam and Propofol infusion  Airway Management Planned: Oral ETT  Additional Equipment: None  Intra-op Plan:   Post-operative Plan: Extubation in OR  Informed Consent: I have reviewed the patients History and Physical, chart, labs and discussed the procedure including the risks, benefits and alternatives for the proposed anesthesia with the patient or authorized representative who has indicated his/her understanding and acceptance.   Dental advisory given  Plan Discussed with: CRNA and Surgeon  Anesthesia Plan Comments:         Anesthesia Quick Evaluation

## 2016-12-20 NOTE — H&P (Signed)
Subjective:   Patient is a 71 y.o. female admitted for neck pain. The patient first presented to me with complaints of neck pain. Onset of symptoms was a few years ago. The pain is described as sharp and stabbing and occurs all day. The pain is rated severe, and is located in the neck and radiates to the arms. The symptoms have been progressive. Symptoms are exacerbated by extending head backwards, and are relieved by none.  Previous work up includes MRI of cervical spine, results: spondylosis/ ddd/ subluxation  Past Medical History:  Diagnosis Date  . Anxiety   . Arthritis   . Cancer (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 Complication of anesthesia    12-19-2003 deceased LOC and hypoxia overmedicating with Morphine and Phenergan; received Narcan X 2, overnight pulse ox  . Depression   . Fibromyalgia   . GERD (gastroesophageal reflux disease)    Hx: of  . H/O hiatal hernia    PMH: of  . Headache(784.0)   . Hypertension   . IBS (irritable bowel syndrome)    Hx: of  . Osteopenia    Hx: of per pt, pt. adds osteoporosis- DOS  . Pneumonia    hx  . PONV (postoperative nausea and vomiting)   . Sleep apnea    Hx: of in the past-?mild apnea 10 yrs ago- followed by dr and none found    Past Surgical History:  Procedure Laterality Date  . APPENDECTOMY    . ARCUATE KERATECTOMY    . BREAST LUMPECTOMY Right 1991  . CERVICAL FUSION  1983  . FRACTURE SURGERY Right 2014   humerus  . HERNIA REPAIR     hiatal  . KNEE ARTHROSCOPY Right 2015  . [place] PLACATION  2010  . ORIF HUMERUS FRACTURE Right 01/08/2013   Procedure: OPEN REDUCTION INTERNAL FIXATION (ORIF) PROXIMAL HUMERUS FRACTURE;  Surgeon: Marybelle Killings, MD;  Location: [facility];  Service: Orthopedics;  Laterality: Right;  Open Reduction Internal Fixation Right Proximal Humerus Fracture  . RADIAL HEAD REPLACEMENT  2005  . trigger thrumb release Left 2001  . tubular plasity  1970    Allergies  Allergen  Reactions  . Boniva [Ibandronic Acid] Other (See Comments)    Bone Pain    Social History  Substance Use Topics  . Smoking status: Former Smoker    Packs/day: 1.00    Years: 15.00    Types: Cigarettes    Quit date: 12/12/1980  . Smokeless tobacco: Never Used     Comment: Quit 1982"  . Alcohol use 4.2 - 6.0 oz/week    7 - 10 Glasses of wine per week    Family History  Problem Relation Age of Onset  . Heart disease Other   . Lung disease Other    Prior to Admission medications   Medication Sig Start Date End Date Taking? Authorizing Provider  Ascorbic Acid (VITAMIN C) 1000 MG tablet Take 1,000 mg by mouth daily.   Yes [provider]  cholecalciferol (VITAMIN D) 1000 UNITS tablet Take 4,000 Units by mouth every morning.    Yes [provider]  Coenzyme Q10 (COQ10) 100 MG CAPS Take 1 capsule by mouth daily.   Yes [provider]  conjugated estrogens (PREMARIN) vaginal cream Place 1 g vaginally 3 (three) times a week.    Yes [provider]  fluticasone (FLONASE) 50 MCG/ACT nasal spray Place 2 sprays into the nose daily as  needed for rhinitis.   Yes [provider]  LORazepam (ATIVAN) 0.5 MG tablet Take 0.5 mg by mouth every 8 (eight) hours as needed for anxiety or sleep. Anxiety    Yes [provider]  mirabegron ER (MYRBETRIQ) 25 MG TB24 tablet Take 25 mg by mouth daily.   Yes [provider]  Omega-3 Fatty Acids (FISH OIL) 1000 MG CAPS Take 1 capsule by mouth daily.   Yes [provider]  OVER THE COUNTER MEDICATION Take 1 capsule by mouth 2 (two) times daily. CBD OIL Caps 25 mg per capsule   Yes [provider]  solifenacin (VESICARE) 5 MG tablet Take 5 mg by mouth daily.   Yes [provider]  Teriparatide, Recombinant, (FORTEO) 600 MCG/2.4ML SOLN Inject 20 mcg into the skin daily.   Yes [provider]  venlafaxine XR (EFFEXOR-XR) 75 MG 24 hr capsule Take 225 mg by mouth daily. 3  tabs daily 05/28/16  Yes [provider]  vitamin B-12 (CYANOCOBALAMIN) 1000 MCG tablet Take 2,000 mcg by mouth every morning.   Yes [provider]  losartan (COZAAR) 50 MG tablet Take 50 mg by mouth daily.    [provider]     Review of Systems  Positive ROS: neg  All other systems have been reviewed and were otherwise negative with the exception of those mentioned in the HPI and as above.  Objective: Vital signs in last 24 hours: Temp:  [98.1 F (36.7 C)] 98.1 F (36.7 C) (10/24 0900) Pulse Rate:  [78] 78 (10/24 0900) Resp:  [18] 18 (10/24 0900) BP: (145)/(78) 145/78 (10/24 0900) SpO2:  [96 %] 96 % (10/24 0900) Weight:  [81.2 kg (179 lb)] 81.2 kg (179 lb) (10/24 0900)  General Appearance: Alert, cooperative, no distress, appears stated age Head: Normocephalic, without obvious abnormality, atraumatic Eyes: PERRL, conjunctiva/corneas clear, EOM's intact      Neck: Supple, symmetrical, trachea midline, Back: Symmetric, no curvature, ROM normal, no CVA tenderness Lungs:  respirations unlabored Heart: Regular rate and rhythm Abdomen: Soft, non-tender Extremities: Extremities normal, atraumatic, no cyanosis or edema Pulses: 2+ and symmetric all extremities Skin: Skin color, texture, turgor normal, no rashes or lesions  NEUROLOGIC:  Mental status: Alert and oriented x4, no aphasia, good attention span, fund of knowledge and memory  Motor Exam - grossly normal Sensory Exam - grossly normal Reflexes: 1 Coordination - grossly normal Gait - grossly normal Balance - grossly normal Cranial Nerves: I: smell Not tested  II: visual acuity  OS: nl    OD: nl  II: visual fields Full to confrontation  II: pupils Equal, round, reactive to light  III,VII: ptosis None  III,IV,VI: extraocular muscles  Full ROM  V: mastication Normal  V: facial light touch sensation  Normal  V,VII: corneal reflex  Present  VII: facial muscle function - upper  Normal  VII:  facial muscle function - lower Normal  VIII: hearing Not tested  IX: soft palate elevation  Normal  IX,X: gag reflex Present  XI: trapezius strength  5/5  XI: sternocleidomastoid strength 5/5  XI: neck flexion strength  5/5  XII: tongue strength  Normal    Data Review Lab Results  Component Value Date   WBC 8.6 12/12/2016   HGB 12.8 12/12/2016   HCT 39.1 12/12/2016   MCV 88.5 12/12/2016   PLT 299 12/12/2016   Lab Results  Component Value Date   NA 139 12/12/2016   K 4.2 12/12/2016   CL 107 12/12/2016  CO2 25 12/12/2016   BUN 16 12/12/2016   CREATININE 0.84 12/12/2016   GLUCOSE 113 (H) 12/12/2016   Lab Results  Component Value Date   INR 0.95 12/12/2016    Assessment:   Cervical neck pain with herniated nucleus pulposus/ spondylosis/ stenosis at C3-T1. Patient has failed conservative therapy. Planned surgery : PCF C3-T2  Plan:   I explained the condition and procedure to the patient and answered any questions.  Patient wishes to proceed with procedure as planned. Understands risks/ benefits/ and expected or typical outcomes.  Analucia Hush S 12/20/2016 11:07 AM

## 2016-12-20 NOTE — Transfer of Care (Signed)
Immediate Anesthesia Transfer of Care Note  Patient: Bonnie Juarez  Procedure(s) Performed: Posterior Cervical Fusion with lateral mass fixation - Cervical Three-Thoracic Two (N/A Spine Cervical)  Patient Location: PACU  Anesthesia Type:General  Level of Consciousness: awake, alert , oriented and patient cooperative  Airway & Oxygen Therapy: Patient Spontanous Breathing and Patient connected to nasal cannula oxygen  Post-op Assessment: Report given to RN, Post -op Vital signs reviewed and stable and Patient moving all extremities  Post vital signs: Reviewed and stable  Last Vitals:  Vitals:   12/20/16 0900  BP: (!) 145/78  Pulse: 78  Resp: 18  Temp: 36.7 C  SpO2: 96%    Last Pain:  Vitals:   12/20/16 0900  TempSrc: Oral         Complications: No apparent anesthesia complications

## 2016-12-20 NOTE — Op Note (Signed)
12/20/2016  3:00 PM  PATIENT:  Bonnie Juarez  71 y.o. female  PRE-OPERATIVE DIAGNOSIS:  Cervical spondylosis C3-T2, subluxation C7-T1, severe facet arthropathy C3-4 C5-6 C6-7 C7-T1, neck and arm pain, cervical pseudoarthrosis  POST-OPERATIVE DIAGNOSIS:  same  PROCEDURE:  1. Decompressive cervical foraminotomy C7-T1 and T1-T2 bilaterally with medial facetectomy, 2. Posterior cervical lateral mass fusion C3-T2 inclusive utilizing morcellized allograft soaked with blood, 3. Posterior segmental fixation C3-T2 utilizing globus lateral mass screws C3-C6 and pedicle screws at T1 and T2 bilaterally  SURGEON:  Sherley Bounds, MD  ASSISTANTS: Dr. Sherwood Gambler  ANESTHESIA:   General  EBL: 450 ml  Total I/O In: 1000 [I.V.:1000] Out: 700 [Urine:250; Blood:450]  BLOOD ADMINISTERED: none  DRAINS: Medium Hemovac  SPECIMEN:  none  INDICATION FOR PROCEDURE: This patient presented with severe neck pain. Imaging showed previous anterior cervical surgery with 2 level of pseudoarthrosis and multilevel spondylosis with subluxation at C3-4 and C7-T1.. The patient tried conservative measures without relief. Pain was debilitating. Recommended posterior cervical decompression and instrumented fusion C3-T2. Patient understood the risks, benefits, and alternatives and potential outcomes and wished to proceed.  PROCEDURE DETAILS: The patient was brought to the operating room. Generalized endotracheal anesthesia was induced. The patient was affixed a radiolucent 3 point [place] headrest and rolled into the prone position on chest rolls. All pressure points were padded. The posterior cervical region was cleaned and prepped with DuraPrep and then draped in the usual sterile fashion. 7 cc of local anesthesia was injected and a dorsal midline incision made in the posterior cervical region and carried down to the cervical fascia. The fascia was opened and the paraspinous musculature was taken down to expose C3-T2.  Intraoperative fluoroscopy confirmed my level and then the dissection was carried out over the lateral facets. I localized the midpoint of each lateral mass and marked a region 1 mm medial to the midpoint of the lateral mass, and then drilled in an upward and outward direction into the safe zone of each lateral mass C3-C6 bilaterally. I drilled to a depth of 14 mm and then checked my drill hole with a ball probe. I then placed a 14 mm lateral mass screws into the safe zone of each lateral mass of C3-C6 bilaterally until they were 2 fingers tight. I then gently performed foraminotomies and medial facetectomies at T1-2 and C7-T1 bilaterally utilizing the high-speed drill and with the 1 and 2 mm Kerrison punches. Once the decompression was complete I turned my attention to the T1 and T2 pedicle screws. I could palpate the pedicle and I localized the pedicle screw intrusion utilizing surface landmarks and lateral fluoroscopy. I drilled each pedicle to a depth of 22 mm at T1 and 24 mm at T2. Palpated each pedicle to make sure no break in the cortex and then placed 22 mm 4.0 pedicle screws at T1 and 24 mm screws at T2. I then decorticated the lateral masses and the facet joints and packed them with local autograft soaked with blood to perform arthrodesis from C3-T2. I then placed rods into the multiaxial screw heads of the screws and locked these into position with the locking caps and anti-torque device. I then checked the final construct with AP/Lat fluoroscopy. I irrigated with saline solution containing bacitracin. I placed a medium Hemovac drain through separate stab incision, and lined the dura with Gelfoam. After hemostasis was achieved I closed the muscle and the fascia with 0 Vicryl, subcutaneous tissue with 2-0 Vicryl, and the subcuticular tissue with 3-0  Vicryl. The skin was closed with Dermabond and benzoin and Steri-Strips. A sterile dressing was applied, the patient was turned to the supine position and  taken out of the headrest, awakened from general anesthesia and transferred to the recovery room in stable condition. At the end of the procedure all sponge, needle and instrument counts were correct.    PLAN OF CARE: Admit to inpatient   PATIENT DISPOSITION:  PACU - hemodynamically stable.   Delay start of Pharmacological VTE agent (>24hrs) due to surgical blood loss or risk of bleeding:  yes

## 2016-12-21 MED ORDER — OXYCODONE HCL 5 MG PO TABS
5.0000 mg | ORAL_TABLET | ORAL | Status: DC | PRN
  Administered 2016-12-21 – 2016-12-22 (×6): 10 mg via ORAL
  Filled 2016-12-21 (×6): qty 2

## 2016-12-21 MED ORDER — HYDROXYZINE HCL 50 MG/ML IM SOLN
50.0000 mg | Freq: Four times a day (QID) | INTRAMUSCULAR | Status: DC | PRN
Start: 2016-12-21 — End: 2016-12-22
  Administered 2016-12-21: 50 mg via INTRAMUSCULAR
  Filled 2016-12-21: qty 1

## 2016-12-21 NOTE — Evaluation (Signed)
Physical Therapy Evaluation Patient Details Name: Bonnie Juarez MRN: [id number] DOB: March 31, 1945 Today's Date: 12/21/2016   History of Present Illness  Pt is a 71 y/o female who presents s/p decompressive cervical foraminotomy C7-T1 and T1-T2 bilaterally with medial facetectomy, posterior cervical lateral mass fusion C3-T2, posterior segmental fixation C3-T2 on 12/20/16.   Clinical Impression  Pt admitted with above diagnosis. Pt currently with functional limitations due to the deficits listed below (see PT Problem List). At the time of PT eval pt was able to perform transfers and ambulation with gross min guard assist for balance support and safety. Pt was able to attempt stair negotiation this session and did well with therapist hands-on assist for safety. Feel she will be able to manage with family support at home. Pt will benefit from skilled PT to increase their independence and safety with mobility to allow discharge to the venue listed below.      Follow Up Recommendations No PT follow up;Supervision for mobility/OOB    Equipment Recommendations  3in1 (PT)    Recommendations for Other Services       Precautions / Restrictions Precautions Precautions: Fall Required Braces or Orthoses: Cervical Brace Cervical Brace: Soft collar Restrictions Weight Bearing Restrictions: No      Mobility  Bed Mobility Overal bed mobility: Needs Assistance Bed Mobility: Rolling;Sidelying to Sit Rolling: Modified independent (Device/Increase time) Sidelying to sit: Supervision       General bed mobility comments: VC's for proper log roll technique. Pt was able to transition to EOB with HOB flat and rails lowered to simulate home environment.   Transfers Overall transfer level: Needs assistance Equipment used: None Transfers: Sit to/from Stand Sit to Stand: Min guard         General transfer comment: Pt was able to power-up to full standing position without difficulty and without  assistance. Increased time required and close guard provided for safety.   Ambulation/Gait Ambulation/Gait assistance: Min guard Ambulation Distance (Feet): 200 Feet Assistive device: None;1 person hand held assist Gait Pattern/deviations: Step-through pattern;Decreased stride length;Trunk flexed Gait velocity: Decreased Gait velocity interpretation: Below normal speed for 71/gender General Gait Details: Occasional HHA provided for increased stability. Pt ambulating well without UE support but requires UE support when attempting to improve gait speed.   Stairs Stairs: Yes Stairs assistance: Min guard Stair Management: One rail Right;Step to pattern;Sideways;Forwards Number of Stairs: 2 General stair comments: VC's for sequencing and general safety. Pt was able to negotiate stairs well.   Wheelchair Mobility    Modified Rankin (Stroke Patients Only)       Balance Overall balance assessment: Needs assistance Sitting-balance support: Feet supported;No upper extremity supported Sitting balance-Leahy Scale: Fair     Standing balance support: No upper extremity supported;During functional activity Standing balance-Leahy Scale: Fair                               Pertinent Vitals/Pain Pain Assessment: 0-10 Pain Score: 4  Pain Location: Incision site Pain Descriptors / Indicators: Operative site guarding;Discomfort Pain Intervention(s): Monitored during session;Limited activity within patient's tolerance;Repositioned    Home Living Family/patient expects to be discharged to:: Private residence Living Arrangements: Spouse/significant other Available Help at Discharge: Family;Available 24 hours/day Type of Home: House Home Access: Stairs to enter   [employer] of Steps: 1 Home Layout: Two level Home Equipment: Walker - 4 wheels;Cane - single point;Shower seat - built in      Prior Function  Level of Independence: Independent                Hand Dominance   Dominant Hand: Right    Extremity/Trunk Assessment   Upper Extremity Assessment Upper Extremity Assessment: Defer to OT evaluation    Lower Extremity Assessment Lower Extremity Assessment: Generalized weakness    Cervical / Trunk Assessment Cervical / Trunk Assessment: Other exceptions Cervical / Trunk Exceptions: Noted forward head/rounded shoulder posture  Communication   Communication: No difficulties  Cognition Arousal/Alertness: Awake/alert Behavior During Therapy: WFL for tasks assessed/performed Overall Cognitive Status: Within Functional Limits for tasks assessed                                        General Comments      Exercises     Assessment/Plan    PT Assessment Patient needs continued PT services  PT Problem List Decreased strength;Decreased range of motion;Decreased activity tolerance;Decreased balance;Decreased mobility;Decreased knowledge of use of DME;Decreased safety awareness;Decreased knowledge of precautions;Pain       PT Treatment Interventions DME instruction;Gait training;Stair training;Functional mobility training;Therapeutic activities;Therapeutic exercise;Neuromuscular re-education;Patient/family education    PT Goals (Current goals can be found in the Care Plan section)  Acute Rehab PT Goals Patient Stated Goal: Return home at d/c PT Goal Formulation: With patient/family Time For Goal Achievement: 12/28/16 Potential to Achieve Goals: Good    Frequency Min 5X/week   Barriers to discharge        Co-evaluation               AM-PAC PT "6 Clicks" Daily Activity  Outcome Measure Difficulty turning over in bed (including adjusting bedclothes, sheets and blankets)?: None Difficulty moving from lying on back to sitting on the side of the bed? : A Little Difficulty sitting down on and standing up from a chair with arms (e.g., wheelchair, bedside commode, etc,.)?: A Little Help needed moving  to and from a bed to chair (including a wheelchair)?: A Little Help needed walking in hospital room?: A Little Help needed climbing 3-5 steps with a railing? : A Little 6 Click Score: 19    End of Session Equipment Utilized During Treatment: Gait belt;Cervical collar Activity Tolerance: Patient tolerated treatment well Patient left: in chair;with call bell/phone within reach;with family/visitor present Nurse Communication: Mobility status PT Visit Diagnosis: Unsteadiness on feet (R26.81);Pain;Other symptoms and signs involving the nervous system (R29.898) Pain - part of body:  (Incision site - cervical area)    Time: 7616-0737 PT Time Calculation (min) (ACUTE ONLY): 21 min   Charges:   PT Evaluation $PT Eval Moderate Complexity: 1 Mod     PT G Codes:        Rolinda Roan, PT, DPT Acute Rehabilitation Services Pager: [place] 12/21/2016, 10:24 AM

## 2016-12-21 NOTE — Progress Notes (Signed)
OT Note Addendum for Charges    12/21/16 1100  OT Time Calculation  OT Start Time (ACUTE ONLY) 0959  OT Stop Time (ACUTE ONLY) 1022  OT Time Calculation (min) 23 min  OT General Charges  $OT Visit 1 Visit  OT Evaluation  $OT Eval Moderate Complexity 1 Mod  OT Treatments  $Self Care/Home Management  8-22 mins   Sharea Guinther A. Ulice Brilliant, M.S., OTR/L Pager: [phone]

## 2016-12-21 NOTE — Anesthesia Postprocedure Evaluation (Signed)
Anesthesia Post Note  Patient: Bonnie Juarez  Procedure(s) Performed: Posterior Cervical Fusion with lateral mass fixation - Cervical Three-Thoracic Two (N/A Spine Cervical)     Patient location during evaluation: PACU Anesthesia Type: General Level of consciousness: awake and alert Pain management: pain level controlled Vital Signs Assessment: post-procedure vital signs reviewed and stable Respiratory status: spontaneous breathing, nonlabored ventilation, respiratory function stable and patient connected to nasal cannula oxygen Cardiovascular status: blood pressure returned to baseline and stable Postop Assessment: no apparent nausea or vomiting Anesthetic complications: no    Last Vitals:  Vitals:   12/21/16 0000 12/21/16 0400  BP: (!) 154/66 (!) 142/73  Pulse: 79 87  Resp: 18 20  Temp: 36.8 C 37.1 C  SpO2: 97% 95%    Last Pain:  Vitals:   12/21/16 0611  TempSrc:   PainSc: 6                  Anyi Fels

## 2016-12-21 NOTE — Therapy (Signed)
Occupational Therapy Evaluation Patient Details Name: Bonnie Juarez MRN: [id number] DOB: 1945/11/06 Today's Date: 12/21/2016    History of Present Illness Pt is a 71 y/o female who presents s/p decompressive cervical foraminotomy C7-T1 and T1-T2 bilaterally with medial facetectomy, posterior cervical lateral mass fusion C3-T2, posterior segmental fixation C3-T2 on 12/20/16.    Clinical Impression   Pt reports being independent in ADLs PTA. Currently, pt requires min assist for UB and LB ADLs and supervision/set up for all other ADLs and functional mobility. Pt reports family is available to provide 24 hour assistance as needed upon d/c home. Pt would benefit from acute OT services to increase independence with ADLs while adhering to cervical precautions. OT will follow acutely to address established goals.     Follow Up Recommendations  No OT follow up;Supervision - Intermittent    Equipment Recommendations  3 in 1 bedside commode    Recommendations for Other Services       Precautions / Restrictions Precautions Precautions: Fall Required Braces or Orthoses: Cervical Brace Cervical Brace: Soft collar Restrictions Weight Bearing Restrictions: No      Mobility Bed Mobility Overal bed mobility: Needs Assistance Bed Mobility: Rolling;Sidelying to Sit Rolling: Modified independent (Device/Increase time) Sidelying to sit: Supervision       General bed mobility comments: Pt sitting in chair upon arrival. Verbally reviewed log roll technique  Transfers Overall transfer level: Needs assistance Equipment used: None Transfers: Sit to/from Stand Sit to Stand: Min guard         General transfer comment: No physical assist required. Min guard for safety     Balance Overall balance assessment: Needs assistance Sitting-balance support: Feet supported;No upper extremity supported Sitting balance-Leahy Scale: Good     Standing balance support: No upper extremity  supported;During functional activity Standing balance-Leahy Scale: Fair                             ADL either performed or assessed with clinical judgement   ADL Overall ADL's : Needs assistance/impaired Eating/Feeding: Modified independent   Grooming: Supervision/safety;Set up;Standing   Upper Body Bathing: Minimal assistance;Sitting   Lower Body Bathing: Minimal assistance;Sit to/from stand   Upper Body Dressing : Minimal assistance;Sitting   Lower Body Dressing: Minimal assistance;Sit to/from stand   Toilet Transfer: Supervision/safety;Set up;Ambulation;Comfort height toilet Toilet Transfer Details (indicate cue type and reason): Simulated sit to stand from chair          Functional mobility during ADLs: Supervision/safety General ADL Comments: Began education on ADLs with cervical prescautions      Vision Baseline Vision/History: Wears glasses Wears Glasses: Reading only       Perception     Praxis      Pertinent Vitals/Pain Pain Assessment: 0-10 Pain Score: 4  Pain Location: Incision site Pain Descriptors / Indicators: Operative site guarding;Discomfort Pain Intervention(s): Monitored during session     Hand Dominance Right   Extremity/Trunk Assessment Upper Extremity Assessment Upper Extremity Assessment: Overall WFL for tasks assessed   Lower Extremity Assessment Lower Extremity Assessment: Defer to PT evaluation   Cervical / Trunk Assessment Cervical / Trunk Assessment: Other exceptions Cervical / Trunk Exceptions: Noted forward head/rounded shoulder posture   Communication Communication Communication: No difficulties   Cognition Arousal/Alertness: Awake/alert Behavior During Therapy: WFL for tasks assessed/performed Overall Cognitive Status: Within Functional Limits for tasks assessed  General Comments  Pt's husband and son present during eval and actively engaged in education.  Pt reports she loves reading and is worried about being able to read with cervial precautions. Educated pt on adative strategies for reading.     Exercises     Shoulder Instructions      Home Living Family/patient expects to be discharged to:: Private residence Living Arrangements: Spouse/significant other Available Help at Discharge: Family;Available 24 hours/day Type of Home: House Home Access: Stairs to enter [employer] of Steps: 1   Home Layout: Two level Alternate Level Stairs-Number of Steps: 2   Bathroom Shower/Tub: Tub only;Walk-in shower   Bathroom Toilet: Handicapped height Bathroom Accessibility: Yes How Accessible: Accessible via walker Home Equipment: Dyer - 4 wheels;Cane - single point;Shower seat - built in          Prior Functioning/Environment Level of Independence: Independent                 OT Problem List: Decreased knowledge of precautions;Decreased knowledge of use of DME or AE;Pain;Impaired balance (sitting and/or standing)      OT Treatment/Interventions: Self-care/ADL training;DME and/or AE instruction;Therapeutic activities;Cognitive remediation/compensation;Patient/family education;Balance training    OT Goals(Current goals can be found in the care plan section) Acute Rehab OT Goals Patient Stated Goal: Return home at d/c OT Goal Formulation: With patient Time For Goal Achievement: 01/04/17 Potential to Achieve Goals: Good ADL Goals Pt Will Perform Grooming: with modified independence;standing Pt Will Perform Upper Body Bathing: with modified independence;standing Pt Will Perform Upper Body Dressing: with modified independence;standing Pt Will Perform Tub/Shower Transfer: with modified independence;ambulating;shower seat  OT Frequency: Min 2X/week   Barriers to D/C:            Co-evaluation              AM-PAC PT "6 Clicks" Daily Activity     Outcome Measure Help from another person eating meals?:  None Help from another person taking care of personal grooming?: None Help from another person toileting, which includes using toliet, bedpan, or urinal?: None Help from another person bathing (including washing, rinsing, drying)?: A Little Help from another person to put on and taking off regular upper body clothing?: A Little Help from another person to put on and taking off regular lower body clothing?: A Little 6 Click Score: 21   End of Session Equipment Utilized During Treatment: Gait belt Nurse Communication: Mobility status  Activity Tolerance: Patient tolerated treatment well Patient left:  (walking in hall with family )  OT Visit Diagnosis: Other abnormalities of gait and mobility (R26.89)                Time: 7591-6384 OT Time Calculation (min): 23 min Charges:    G-Codes:     Boykin Peek, OTS [phone]  Boykin Peek 12/21/2016, 11:11 AM

## 2016-12-21 NOTE — Progress Notes (Signed)
Patient ID: Bonnie Juarez, female   DOB: 20-Aug-1945, 71 y.o.   MRN: [id number] Subjective: Patient reports neck soreness, no arm pain or NTW, has walked  Objective: Vital signs in last 24 hours: Temp:  [97 F (36.1 C)-98.7 F (37.1 C)] 98.7 F (37.1 C) (10/25 0400) Pulse Rate:  [72-99] 87 (10/25 0400) Resp:  [10-39] 20 (10/25 0400) BP: (100-179)/(50-97) 142/73 (10/25 0400) SpO2:  [95 %-100 %] 95 % (10/25 0400) Weight:  [81.2 kg (179 lb)] 81.2 kg (179 lb) (10/24 0900)  Intake/Output from previous day: 10/24 0701 - 10/25 0700 In: 2100 [I.V.:2100] Out: 870 [Urine:250; Drains:170; Blood:450] Intake/Output this shift: No intake/output data recorded.  Neurologic: Grossly normal  Lab Results: Lab Results  Component Value Date   WBC 8.6 12/12/2016   HGB 12.8 12/12/2016   HCT 39.1 12/12/2016   MCV 88.5 12/12/2016   PLT 299 12/12/2016   Lab Results  Component Value Date   INR 0.95 12/12/2016   BMET Lab Results  Component Value Date   NA 139 12/12/2016   K 4.2 12/12/2016   CL 107 12/12/2016   CO2 25 12/12/2016   GLUCOSE 113 (H) 12/12/2016   BUN 16 12/12/2016   CREATININE 0.84 12/12/2016   CALCIUM 9.6 12/12/2016    Studies/Results: Dg Cervical Spine 2-3 Views  Result Date: 12/20/2016 CLINICAL DATA:  C3-T2 fusion. EXAM: DG C-ARM 61-120 MIN; CERVICAL SPINE - 2-3 VIEW COMPARISON:  11/24/2016 radiographs FINDINGS: Four intraoperative spot views of the cervical and upper thoracic spine are submitted postoperatively for interpretation. Posterior rod and bipedicular screw fixation noted from C3 to T2. No definite complicating features noted. IMPRESSION: Posterior fusion from C3 to T2 without definite complicating features. Electronically Signed   By: Margarette Canada M.D.   On: 12/20/2016 14:43   Dg C-arm 61-120 Min  Result Date: 12/20/2016 CLINICAL DATA:  C3-T2 fusion. EXAM: DG C-ARM 61-120 MIN; CERVICAL SPINE - 2-3 VIEW COMPARISON:  11/24/2016 radiographs FINDINGS: Four  intraoperative spot views of the cervical and upper thoracic spine are submitted postoperatively for interpretation. Posterior rod and bipedicular screw fixation noted from C3 to T2. No definite complicating features noted. IMPRESSION: Posterior fusion from C3 to T2 without definite complicating features. Electronically Signed   By: Margarette Canada M.D.   On: 12/20/2016 14:43    Assessment/Plan: Doing well, soreness decreasing, mobilize today   LOS: 1 day    Duncan Alejandro S 12/21/2016, 8:01 AM

## 2016-12-22 ENCOUNTER — Encounter (HOSPITAL_COMMUNITY): Payer: Self-pay | Admitting: Neurological Surgery

## 2016-12-22 MED ORDER — METHOCARBAMOL 500 MG PO TABS: 500.0000 mg | ORAL_TABLET | Freq: Four times a day (QID) | ORAL | 1 refills | Status: DC | PRN

## 2016-12-22 MED ORDER — OXYCODONE HCL 5 MG PO TABS: 5.0000 mg | ORAL_TABLET | Freq: Four times a day (QID) | ORAL | 0 refills | Status: DC | PRN

## 2016-12-22 NOTE — Therapy (Signed)
Occupational Therapy Treatment Patient Details Name: Bonnie Juarez MRN: [id number] DOB: Dec 28, 1945 Today's Date: 12/22/2016    History of present illness Pt is a 71 y/o female who presents s/p decompressive cervical foraminotomy C7-T1 and T1-T2 bilaterally with medial facetectomy, posterior cervical lateral mass fusion C3-T2, posterior segmental fixation C3-T2 on 12/20/16.    OT comments  Focus on today's session on increased independence with ADLs and functional mobility with cervical precautions. Pt able to return demonstration of all compensatory techniques for ADLs with cervical precautions with supervision for safety. Pt discharged home. No OT follow up needed.    Follow Up Recommendations  No OT follow up;Supervision - Intermittent    Equipment Recommendations  3 in 1 bedside commode    Recommendations for Other Services      Precautions / Restrictions Precautions Precautions: Fall;Cervical Required Braces or Orthoses: Cervical Brace Cervical Brace: Soft collar Restrictions Weight Bearing Restrictions: No       Mobility Bed Mobility               General bed mobility comments: Pt sitting up on EOB when OT arrived.   Transfers Overall transfer level: Needs assistance Equipment used: None Transfers: Sit to/from Stand Sit to Stand: Supervision         General transfer comment: No physical assist required. Min guard for safety     Balance Overall balance assessment: Needs assistance Sitting-balance support: Feet supported;No upper extremity supported Sitting balance-Leahy Scale: Good     Standing balance support: No upper extremity supported;During functional activity Standing balance-Leahy Scale: Good                             ADL either performed or assessed with clinical judgement   ADL Overall ADL's : Needs assistance/impaired                                       General ADL Comments: Reviewed ADL tasks with  cervical precautions. Pt advised to only raise one arm over head while shampooing hair and to not bring both arms above her head at the same time. Pt educated on safe sex with cervical precautions.      Vision       Perception     Praxis      Cognition Arousal/Alertness: Awake/alert Behavior During Therapy: WFL for tasks assessed/performed Overall Cognitive Status: Within Functional Limits for tasks assessed                                          Exercises     Shoulder Instructions       General Comments Pt's husband present during session.    Pertinent Vitals/ Pain       Pain Assessment: Faces Faces Pain Scale: Hurts little more Pain Location: Incision site Pain Descriptors / Indicators: Operative site guarding;Discomfort Pain Intervention(s): Limited activity within patient's tolerance;Monitored during session  Home Living                                          Prior Functioning/Environment              Frequency  Min  2X/week        Progress Toward Goals  OT Goals(current goals can now be found in the care plan section)  Progress towards OT goals: Progressing toward goals  Acute Rehab OT Goals Patient Stated Goal: Return home at d/c OT Goal Formulation: With patient Time For Goal Achievement: 01/04/17 Potential to Achieve Goals: Good ADL Goals Pt Will Perform Grooming: with modified independence;standing Pt Will Perform Upper Body Bathing: with modified independence;standing Pt Will Perform Upper Body Dressing: with modified independence;standing Pt Will Perform Tub/Shower Transfer: with modified independence;ambulating;shower seat  Plan Discharge plan remains appropriate    Co-evaluation                 AM-PAC PT "6 Clicks" Daily Activity     Outcome Measure   Help from another person eating meals?: None Help from another person taking care of personal grooming?: None Help from another person  toileting, which includes using toliet, bedpan, or urinal?: None Help from another person bathing (including washing, rinsing, drying)?: A Little Help from another person to put on and taking off regular upper body clothing?: A Little Help from another person to put on and taking off regular lower body clothing?: A Little 6 Click Score: 21    End of Session Equipment Utilized During Treatment: Gait belt  OT Visit Diagnosis: Other abnormalities of gait and mobility (R26.89)   Activity Tolerance Patient tolerated treatment well   Patient Left     Nurse Communication Mobility status        Time: 5790-3833 OT Time Calculation (min): 11 min  Charges: OT General Charges $OT Visit: 1 Visit OT Treatments $Self Care/Home Management : 8-22 mins  Boykin Peek, [place] #[phone]    Boykin Peek 12/22/2016, 1:56 PM

## 2016-12-22 NOTE — Discharge Summary (Signed)
Physician Discharge Summary  Patient ID: Bonnie Juarez MRN: [id number] DOB/AGE: 10-03-1945 72 y.o.  Admit date: 12/20/2016 Discharge date: 12/22/2016  Admission Diagnoses: cervical spondylosis    Discharge Diagnoses: same   Discharged Condition: good  Hospital Course: The patient was admitted on 12/20/2016 and taken to the operating room where the patient underwent PCF. The patient tolerated the procedure well and was taken to the recovery room and then to the floor in stable condition. The hospital course was routine. There were no complications. The wound remained clean dry and intact. Pt had appropriate neck soreness. No complaints of arm pain or new N/T/W. The patient remained afebrile with stable vital signs, and tolerated a regular diet. The patient continued to increase activities, and pain was well controlled with oral pain medications.   Consults: None  Significant Diagnostic Studies:  Results for orders placed or performed during the hospital encounter of 12/12/16  Surgical pcr screen  Result Value Ref Range   MRSA, PCR NEGATIVE NEGATIVE   Staphylococcus aureus POSITIVE (A) NEGATIVE  Basic metabolic panel  Result Value Ref Range   Sodium 139 135 - 145 mmol/L   Potassium 4.2 3.5 - 5.1 mmol/L   Chloride 107 101 - 111 mmol/L   CO2 25 22 - 32 mmol/L   Glucose, Bld 113 (H) 65 - 99 mg/dL   BUN 16 6 - 20 mg/dL   Creatinine, Ser 4.78 0.44 - 1.00 mg/dL   Calcium 9.6 8.9 - 29.5 mg/dL   GFR calc non Af Amer >60 >60 mL/min   GFR calc Af Amer >60 >60 mL/min   Anion gap 7 5 - 15  CBC WITH DIFFERENTIAL  Result Value Ref Range   WBC 8.6 4.0 - 10.5 K/uL   RBC 4.42 3.87 - 5.11 MIL/uL   Hemoglobin 12.8 12.0 - 15.0 g/dL   HCT 62.1 30.8 - 65.7 %   MCV 88.5 78.0 - 100.0 fL   MCH 29.0 26.0 - 34.0 pg   MCHC 32.7 30.0 - 36.0 g/dL   RDW 84.6 96.2 - 95.2 %   Platelets 299 150 - 400 K/uL   Neutrophils Relative % 44 %   Neutro Abs 3.8 1.7 - 7.7 K/uL   Lymphocytes Relative 46 %   Lymphs Abs 4.0 0.7 - 4.0 K/uL   Monocytes Relative 6 %   Monocytes Absolute 0.5 0.1 - 1.0 K/uL   Eosinophils Relative 3 %   Eosinophils Absolute 0.2 0.0 - 0.7 K/uL   Basophils Relative 1 %   Basophils Absolute 0.0 0.0 - 0.1 K/uL  Protime-INR  Result Value Ref Range   Prothrombin Time 12.6 11.4 - 15.2 seconds   INR 0.95   Type and screen [facility]  Result Value Ref Range   ABO/RH(D) A POS    Antibody Screen NEG    Sample Expiration 12/26/2016    Extend sample reason NO TRANSFUSIONS OR PREGNANCY IN THE PAST 3 MONTHS   ABO/Rh  Result Value Ref Range   ABO/RH(D) A POS     Chest 2 View  Result Date: 12/12/2016 CLINICAL DATA:  Preoperative chest.  Cervical spine surgery . EXAM: CHEST  2 VIEW COMPARISON:  09/04/2012. FINDINGS: Mediastinum and hilar structures normal. Lungs are clear. No pleural effusion or pneumothorax. Degenerative change thoracic spine. Postsurgical changes right humerus. IMPRESSION: No acute cardiopulmonary disease. Electronically Signed   By: Maisie Fus  Register   On: 12/12/2016 11:28   Dg Cervical Spine 2-3 Views  Result Date: 12/20/2016 CLINICAL DATA:  C3-T2 fusion. EXAM: DG C-ARM 61-120 MIN; CERVICAL SPINE - 2-3 VIEW COMPARISON:  11/24/2016 radiographs FINDINGS: Four intraoperative spot views of the cervical and upper thoracic spine are submitted postoperatively for interpretation. Posterior rod and bipedicular screw fixation noted from C3 to T2. No definite complicating features noted. IMPRESSION: Posterior fusion from C3 to T2 without definite complicating features. Electronically Signed   By: Harmon Pier M.D.   On: 12/20/2016 14:43   Dg C-arm 61-120 Min  Result Date: 12/20/2016 CLINICAL DATA:  C3-T2 fusion. EXAM: DG C-ARM 61-120 MIN; CERVICAL SPINE - 2-3 VIEW COMPARISON:  11/24/2016 radiographs FINDINGS: Four intraoperative spot views of the cervical and upper thoracic spine are submitted postoperatively for interpretation. Posterior rod and  bipedicular screw fixation noted from C3 to T2. No definite complicating features noted. IMPRESSION: Posterior fusion from C3 to T2 without definite complicating features. Electronically Signed   By: Harmon Pier M.D.   On: 12/20/2016 14:43    Antibiotics:  Anti-infectives    Start     Dose/Rate Route Frequency Ordered Stop   12/20/16 1900  ceFAZolin (ANCEF) IVPB 2g/100 mL premix     2 g 200 mL/hr over 30 Minutes Intravenous Every 8 hours 12/20/16 1817 12/21/16 0245   12/20/16 1432  vancomycin (VANCOCIN) powder  Status:  Discontinued       As needed 12/20/16 1432 12/20/16 1458   12/20/16 1251  bacitracin 50,000 Units in sodium chloride irrigation 0.9 % 500 mL irrigation  Status:  Discontinued       As needed 12/20/16 1251 12/20/16 1458   12/20/16 0900  ceFAZolin (ANCEF) IVPB 2g/100 mL premix     2 g 200 mL/hr over 30 Minutes Intravenous To Surgery 12/20/16 0857 12/20/16 1140      Discharge Exam: Blood pressure 139/78, pulse 82, temperature 98.6 F (37 C), resp. rate 16, height 5\' 6"  (1.676 m), weight 81.2 kg (179 lb), SpO2 93 %. Neurologic: Grossly normal Dressing dry  Discharge Medications:   Allergies as of 12/22/2016      Reactions   Boniva [ibandronic Acid] Other (See Comments)   Bone Pain      Medication List    TAKE these medications   cholecalciferol 1000 units tablet Commonly known as:  VITAMIN D Take 4,000 Units by mouth every morning.   conjugated estrogens vaginal cream Commonly known as:  PREMARIN Place 1 g vaginally 3 (three) times a week.   CoQ10 100 MG Caps Take 1 capsule by mouth daily.   Fish Oil 1000 MG Caps Take 1 capsule by mouth daily.   fluticasone 50 MCG/ACT nasal spray Commonly known as:  FLONASE Place 2 sprays into the nose daily as needed for rhinitis.   FORTEO 600 MCG/2.4ML Soln Generic drug:  Teriparatide (Recombinant) Inject 20 mcg into the skin daily.   LORazepam 0.5 MG tablet Commonly known as:  ATIVAN Take 0.5 mg by mouth every  8 (eight) hours as needed for anxiety or sleep. Anxiety   losartan 50 MG tablet Commonly known as:  COZAAR Take 50 mg by mouth daily.   methocarbamol 500 MG tablet Commonly known as:  ROBAXIN Take 1 tablet (500 mg total) by mouth every 6 (six) hours as needed for muscle spasms.   MYRBETRIQ 25 MG Tb24 tablet Generic drug:  mirabegron ER Take 25 mg by mouth daily.   OVER THE COUNTER MEDICATION Take 1 capsule by mouth 2 (two) times daily. CBD OIL Caps 25 mg per capsule   oxyCODONE 5 MG immediate release  tablet Commonly known as:  Oxy IR/ROXICODONE Take 1-2 tablets (5-10 mg total) by mouth every 6 (six) hours as needed for moderate pain ((score 4 to 6)).   solifenacin 5 MG tablet Commonly known as:  VESICARE Take 5 mg by mouth daily.   venlafaxine XR 75 MG 24 hr capsule Commonly known as:  EFFEXOR-XR Take 225 mg by mouth daily. 3 tabs daily   vitamin B-12 1000 MCG tablet Commonly known as:  CYANOCOBALAMIN Take 2,000 mcg by mouth every morning.   vitamin C 1000 MG tablet Take 1,000 mg by mouth daily.            Durable Medical Equipment        Start     Ordered   12/21/16 0930  For home use only DME 3 n 1  Once     12/21/16 0929   12/20/16 1818  DME Walker rolling  Once    Question:  Patient needs a walker to treat with the following condition  Answer:  S/P lumbar fusion   12/20/16 1817   12/20/16 1818  DME 3 n 1  Once     12/20/16 1817      Disposition: home   Final Dx: PCF C3-T2  Discharge Instructions    Call MD for:  difficulty breathing, headache or visual disturbances    Complete by:  As directed    Call MD for:  persistant nausea and vomiting    Complete by:  As directed    Call MD for:  redness, tenderness, or signs of infection (pain, swelling, redness, odor or green/yellow discharge around incision site)    Complete by:  As directed    Call MD for:  severe uncontrolled pain    Complete by:  As directed    Call MD for:  temperature >100.4     Complete by:  As directed    Diet - low sodium heart healthy    Complete by:  As directed    Increase activity slowly    Complete by:  As directed    Remove dressing in 24 hours    Complete by:  As directed          Signed: Adalynn Corne S 12/22/2016, 9:09 AM

## 2016-12-22 NOTE — Progress Notes (Signed)
Patient alert and oriented, mae's well, voiding adequate amount of urine, swallowing without difficulty, no c/o pain at time of discharge. Patient discharged home with family. Script and discharged instructions given to patient. Patient and family stated understanding of instructions given. Patient has an appointment with Dr. Jones °

## 2016-12-22 NOTE — Progress Notes (Signed)
Physical Therapy Treatment Patient Details Name: Bonnie Juarez MRN: [id number] DOB: Jul 17, 1945 Today's Date: 12/22/2016    History of Present Illness Pt is a 71 y/o female who presents s/p decompressive cervical foraminotomy C7-T1 and T1-T2 bilaterally with medial facetectomy, posterior cervical lateral mass fusion C3-T2, posterior segmental fixation C3-T2 on 12/20/16.     PT Comments    Pt progressing towards physical therapy goals. Was able to perform transfers and ambulation with gross min guard assist for balance support and safety. No AD required this session. Pt and husband were educated on precautions, positioning, walking program and general safety with activity progression. Will continue to follow and progress as able per POC.    Follow Up Recommendations  No PT follow up;Supervision for mobility/OOB     Equipment Recommendations  3in1 (PT)    Recommendations for Other Services       Precautions / Restrictions Precautions Precautions: Fall Required Braces or Orthoses: Cervical Brace Cervical Brace: Soft collar Restrictions Weight Bearing Restrictions: No    Mobility  Bed Mobility               General bed mobility comments: Pt sitting up on EOB when PT arrived.   Transfers Overall transfer level: Needs assistance Equipment used: None Transfers: Sit to/from Stand Sit to Stand: Min guard         General transfer comment: No physical assist required. Min guard for safety   Ambulation/Gait Ambulation/Gait assistance: Min guard   Assistive device: None;1 person hand held assist Gait Pattern/deviations: Step-through pattern;Decreased stride length;Trunk flexed Gait velocity: Decreased   General Gait Details: Occasional HHA provided for increased stability. Pt ambulating well without UE support but requires UE support when attempting to improve gait speed.    Stairs            Wheelchair Mobility    Modified Rankin (Stroke Patients Only)        Balance Overall balance assessment: Needs assistance Sitting-balance support: Feet supported;No upper extremity supported Sitting balance-Leahy Scale: Good     Standing balance support: No upper extremity supported;During functional activity Standing balance-Leahy Scale: Fair                              Cognition Arousal/Alertness: Awake/alert Behavior During Therapy: WFL for tasks assessed/performed Overall Cognitive Status: Within Functional Limits for tasks assessed                                        Exercises      General Comments        Pertinent Vitals/Pain Pain Assessment: Faces Faces Pain Scale: Hurts little more Pain Location: Incision site Pain Descriptors / Indicators: Operative site guarding;Discomfort Pain Intervention(s): Monitored during session    Home Living                      Prior Function            PT Goals (current goals can now be found in the care plan section) Acute Rehab PT Goals Patient Stated Goal: Return home at d/c PT Goal Formulation: With patient/family Time For Goal Achievement: 12/28/16 Potential to Achieve Goals: Good Progress towards PT goals: Progressing toward goals    Frequency    Min 5X/week      PT Plan Current plan remains appropriate  Co-evaluation              AM-PAC PT "6 Clicks" Daily Activity  Outcome Measure  Difficulty turning over in bed (including adjusting bedclothes, sheets and blankets)?: None Difficulty moving from lying on back to sitting on the side of the bed? : A Little Difficulty sitting down on and standing up from a chair with arms (e.g., wheelchair, bedside commode, etc,.)?: A Little Help needed moving to and from a bed to chair (including a wheelchair)?: A Little Help needed walking in hospital room?: A Little Help needed climbing 3-5 steps with a railing? : A Little 6 Click Score: 19    End of Session Equipment Utilized  During Treatment: Gait belt;Cervical collar Activity Tolerance: Patient tolerated treatment well Patient left: in chair;with call bell/phone within reach;with family/visitor present Nurse Communication: Mobility status PT Visit Diagnosis: Unsteadiness on feet (R26.81);Pain;Other symptoms and signs involving the nervous system (R29.898) Pain - part of body:  (Incision site - cervical area)     Time: 1100-1120 PT Time Calculation (min) (ACUTE ONLY): 20 min  Charges:  $Gait Training: 8-22 mins                    G Codes:       Rolinda Roan, PT, DPT Acute Rehabilitation Services Pager: [place] 12/22/2016, 1:14 PM

## 2016-12-26 ENCOUNTER — Encounter (HOSPITAL_COMMUNITY): Payer: Self-pay | Admitting: Neurological Surgery

## 2017-01-30 DIAGNOSIS — M542 Cervicalgia: Secondary | ICD-10-CM | POA: Diagnosis not present

## 2017-04-20 DIAGNOSIS — K219 Gastro-esophageal reflux disease without esophagitis: Secondary | ICD-10-CM | POA: Diagnosis not present

## 2017-04-20 DIAGNOSIS — N3941 Urge incontinence: Secondary | ICD-10-CM | POA: Diagnosis not present

## 2017-04-20 DIAGNOSIS — F419 Anxiety disorder, unspecified: Secondary | ICD-10-CM | POA: Diagnosis not present

## 2017-04-20 DIAGNOSIS — R7301 Impaired fasting glucose: Secondary | ICD-10-CM | POA: Diagnosis not present

## 2017-04-20 DIAGNOSIS — M797 Fibromyalgia: Secondary | ICD-10-CM | POA: Diagnosis not present

## 2017-04-20 DIAGNOSIS — G479 Sleep disorder, unspecified: Secondary | ICD-10-CM | POA: Diagnosis not present

## 2017-04-20 DIAGNOSIS — F3341 Major depressive disorder, recurrent, in partial remission: Secondary | ICD-10-CM | POA: Diagnosis not present

## 2017-04-20 DIAGNOSIS — J309 Allergic rhinitis, unspecified: Secondary | ICD-10-CM | POA: Diagnosis not present

## 2017-04-20 DIAGNOSIS — E782 Mixed hyperlipidemia: Secondary | ICD-10-CM | POA: Diagnosis not present

## 2017-04-20 DIAGNOSIS — I10 Essential (primary) hypertension: Secondary | ICD-10-CM | POA: Diagnosis not present

## 2017-04-20 DIAGNOSIS — M810 Age-related osteoporosis without current pathological fracture: Secondary | ICD-10-CM | POA: Diagnosis not present

## 2017-04-26 DIAGNOSIS — G479 Sleep disorder, unspecified: Secondary | ICD-10-CM | POA: Diagnosis not present

## 2017-04-26 DIAGNOSIS — N3941 Urge incontinence: Secondary | ICD-10-CM | POA: Diagnosis not present

## 2017-04-26 DIAGNOSIS — M797 Fibromyalgia: Secondary | ICD-10-CM | POA: Diagnosis not present

## 2017-04-26 DIAGNOSIS — R7303 Prediabetes: Secondary | ICD-10-CM | POA: Diagnosis not present

## 2017-04-26 DIAGNOSIS — F419 Anxiety disorder, unspecified: Secondary | ICD-10-CM | POA: Diagnosis not present

## 2017-04-26 DIAGNOSIS — M810 Age-related osteoporosis without current pathological fracture: Secondary | ICD-10-CM | POA: Diagnosis not present

## 2017-04-26 DIAGNOSIS — K219 Gastro-esophageal reflux disease without esophagitis: Secondary | ICD-10-CM | POA: Diagnosis not present

## 2017-04-26 DIAGNOSIS — E782 Mixed hyperlipidemia: Secondary | ICD-10-CM | POA: Diagnosis not present

## 2017-04-26 DIAGNOSIS — I10 Essential (primary) hypertension: Secondary | ICD-10-CM | POA: Diagnosis not present

## 2017-04-26 DIAGNOSIS — F341 Dysthymic disorder: Secondary | ICD-10-CM | POA: Diagnosis not present

## 2017-04-26 DIAGNOSIS — J309 Allergic rhinitis, unspecified: Secondary | ICD-10-CM | POA: Diagnosis not present

## 2017-05-08 DIAGNOSIS — M542 Cervicalgia: Secondary | ICD-10-CM | POA: Diagnosis not present

## 2017-06-29 DIAGNOSIS — R7303 Prediabetes: Secondary | ICD-10-CM | POA: Diagnosis not present

## 2017-06-29 DIAGNOSIS — J309 Allergic rhinitis, unspecified: Secondary | ICD-10-CM | POA: Diagnosis not present

## 2017-06-29 DIAGNOSIS — G479 Sleep disorder, unspecified: Secondary | ICD-10-CM | POA: Diagnosis not present

## 2017-06-29 DIAGNOSIS — M797 Fibromyalgia: Secondary | ICD-10-CM | POA: Diagnosis not present

## 2017-06-29 DIAGNOSIS — I10 Essential (primary) hypertension: Secondary | ICD-10-CM | POA: Diagnosis not present

## 2017-06-29 DIAGNOSIS — K219 Gastro-esophageal reflux disease without esophagitis: Secondary | ICD-10-CM | POA: Diagnosis not present

## 2017-06-29 DIAGNOSIS — N3941 Urge incontinence: Secondary | ICD-10-CM | POA: Diagnosis not present

## 2017-06-29 DIAGNOSIS — F419 Anxiety disorder, unspecified: Secondary | ICD-10-CM | POA: Diagnosis not present

## 2017-06-29 DIAGNOSIS — M810 Age-related osteoporosis without current pathological fracture: Secondary | ICD-10-CM | POA: Diagnosis not present

## 2017-06-29 DIAGNOSIS — F341 Dysthymic disorder: Secondary | ICD-10-CM | POA: Diagnosis not present

## 2017-06-29 DIAGNOSIS — E782 Mixed hyperlipidemia: Secondary | ICD-10-CM | POA: Diagnosis not present

## 2017-07-16 DIAGNOSIS — H02423 Myogenic ptosis of bilateral eyelids: Secondary | ICD-10-CM | POA: Diagnosis not present

## 2017-07-16 DIAGNOSIS — H0279 Other degenerative disorders of eyelid and periocular area: Secondary | ICD-10-CM | POA: Diagnosis not present

## 2017-07-16 DIAGNOSIS — H02831 Dermatochalasis of right upper eyelid: Secondary | ICD-10-CM | POA: Diagnosis not present

## 2017-07-16 DIAGNOSIS — R234 Changes in skin texture: Secondary | ICD-10-CM | POA: Diagnosis not present

## 2017-07-16 DIAGNOSIS — H02135 Senile ectropion of left lower eyelid: Secondary | ICD-10-CM | POA: Diagnosis not present

## 2017-07-16 DIAGNOSIS — H02132 Senile ectropion of right lower eyelid: Secondary | ICD-10-CM | POA: Diagnosis not present

## 2017-07-16 DIAGNOSIS — H02413 Mechanical ptosis of bilateral eyelids: Secondary | ICD-10-CM | POA: Diagnosis not present

## 2017-07-16 DIAGNOSIS — H53483 Generalized contraction of visual field, bilateral: Secondary | ICD-10-CM | POA: Diagnosis not present

## 2017-07-16 DIAGNOSIS — H02834 Dermatochalasis of left upper eyelid: Secondary | ICD-10-CM | POA: Diagnosis not present

## 2017-08-13 ENCOUNTER — Telehealth (INDEPENDENT_AMBULATORY_CARE_PROVIDER_SITE_OTHER): Payer: Self-pay | Admitting: Physical Medicine and Rehabilitation

## 2017-08-13 NOTE — Telephone Encounter (Signed)
Called pt and advised that she has to fill out form in order for her records to be released.

## 2017-08-13 NOTE — Telephone Encounter (Signed)
Patient is requesting medical records from Dr. Ernestina Patches of her spinal ablation she had done. I told her she would need to fill out medical release form but to see if we could go ahead and work on it for her. She would like to pick it up tomorrow if possible when she fills out form.

## 2017-08-14 NOTE — Telephone Encounter (Signed)
Patient has been told that she needs to complete records release form

## 2017-08-21 DIAGNOSIS — M47816 Spondylosis without myelopathy or radiculopathy, lumbar region: Secondary | ICD-10-CM | POA: Diagnosis not present

## 2017-08-23 DIAGNOSIS — F4310 Post-traumatic stress disorder, unspecified: Secondary | ICD-10-CM | POA: Diagnosis not present

## 2017-08-23 DIAGNOSIS — F411 Generalized anxiety disorder: Secondary | ICD-10-CM | POA: Diagnosis not present

## 2017-09-05 DIAGNOSIS — F411 Generalized anxiety disorder: Secondary | ICD-10-CM | POA: Diagnosis not present

## 2017-09-05 DIAGNOSIS — F4310 Post-traumatic stress disorder, unspecified: Secondary | ICD-10-CM | POA: Diagnosis not present

## 2017-09-06 DIAGNOSIS — F4310 Post-traumatic stress disorder, unspecified: Secondary | ICD-10-CM | POA: Diagnosis not present

## 2017-09-06 DIAGNOSIS — F411 Generalized anxiety disorder: Secondary | ICD-10-CM | POA: Diagnosis not present

## 2017-09-10 DIAGNOSIS — F411 Generalized anxiety disorder: Secondary | ICD-10-CM | POA: Diagnosis not present

## 2017-09-10 DIAGNOSIS — F4310 Post-traumatic stress disorder, unspecified: Secondary | ICD-10-CM | POA: Diagnosis not present

## 2017-09-12 DIAGNOSIS — Z6828 Body mass index (BMI) 28.0-28.9, adult: Secondary | ICD-10-CM | POA: Diagnosis not present

## 2017-09-12 DIAGNOSIS — I10 Essential (primary) hypertension: Secondary | ICD-10-CM | POA: Diagnosis not present

## 2017-09-12 DIAGNOSIS — M47816 Spondylosis without myelopathy or radiculopathy, lumbar region: Secondary | ICD-10-CM | POA: Diagnosis not present

## 2017-09-17 DIAGNOSIS — H02834 Dermatochalasis of left upper eyelid: Secondary | ICD-10-CM | POA: Diagnosis not present

## 2017-09-17 DIAGNOSIS — H0279 Other degenerative disorders of eyelid and periocular area: Secondary | ICD-10-CM | POA: Diagnosis not present

## 2017-09-17 DIAGNOSIS — H53483 Generalized contraction of visual field, bilateral: Secondary | ICD-10-CM | POA: Diagnosis not present

## 2017-09-17 DIAGNOSIS — H02413 Mechanical ptosis of bilateral eyelids: Secondary | ICD-10-CM | POA: Diagnosis not present

## 2017-09-17 DIAGNOSIS — H02831 Dermatochalasis of right upper eyelid: Secondary | ICD-10-CM | POA: Diagnosis not present

## 2017-09-17 DIAGNOSIS — H02423 Myogenic ptosis of bilateral eyelids: Secondary | ICD-10-CM | POA: Diagnosis not present

## 2017-09-17 DIAGNOSIS — H02132 Senile ectropion of right lower eyelid: Secondary | ICD-10-CM | POA: Diagnosis not present

## 2017-09-17 DIAGNOSIS — H02135 Senile ectropion of left lower eyelid: Secondary | ICD-10-CM | POA: Diagnosis not present

## 2017-09-26 DIAGNOSIS — F411 Generalized anxiety disorder: Secondary | ICD-10-CM | POA: Diagnosis not present

## 2017-09-26 DIAGNOSIS — F4310 Post-traumatic stress disorder, unspecified: Secondary | ICD-10-CM | POA: Diagnosis not present

## 2017-10-09 ENCOUNTER — Other Ambulatory Visit: Payer: Self-pay | Admitting: Family Medicine

## 2017-10-09 ENCOUNTER — Other Ambulatory Visit (HOSPITAL_COMMUNITY)
Admission: RE | Admit: 2017-10-09 | Discharge: 2017-10-09 | Disposition: A | Discharge status: Home / Self Care | Payer: PPO | Source: Ambulatory Visit | Attending: Family Medicine | Admitting: Family Medicine

## 2017-10-09 DIAGNOSIS — E782 Mixed hyperlipidemia: Secondary | ICD-10-CM | POA: Diagnosis not present

## 2017-10-09 DIAGNOSIS — F4310 Post-traumatic stress disorder, unspecified: Secondary | ICD-10-CM | POA: Diagnosis not present

## 2017-10-09 DIAGNOSIS — Z0000 Encounter for general adult medical examination without abnormal findings: Secondary | ICD-10-CM | POA: Diagnosis not present

## 2017-10-09 DIAGNOSIS — Z9189 Other specified personal risk factors, not elsewhere classified: Secondary | ICD-10-CM | POA: Diagnosis not present

## 2017-10-09 DIAGNOSIS — Z124 Encounter for screening for malignant neoplasm of cervix: Secondary | ICD-10-CM | POA: Insufficient documentation

## 2017-10-09 DIAGNOSIS — I10 Essential (primary) hypertension: Secondary | ICD-10-CM | POA: Diagnosis not present

## 2017-10-09 DIAGNOSIS — M810 Age-related osteoporosis without current pathological fracture: Secondary | ICD-10-CM | POA: Diagnosis not present

## 2017-10-09 DIAGNOSIS — Z01411 Encounter for gynecological examination (general) (routine) with abnormal findings: Secondary | ICD-10-CM | POA: Diagnosis not present

## 2017-10-09 DIAGNOSIS — M797 Fibromyalgia: Secondary | ICD-10-CM | POA: Diagnosis not present

## 2017-10-09 DIAGNOSIS — F411 Generalized anxiety disorder: Secondary | ICD-10-CM | POA: Diagnosis not present

## 2017-10-09 DIAGNOSIS — R87612 Low grade squamous intraepithelial lesion on cytologic smear of cervix (LGSIL): Secondary | ICD-10-CM | POA: Diagnosis not present

## 2017-10-09 DIAGNOSIS — K219 Gastro-esophageal reflux disease without esophagitis: Secondary | ICD-10-CM | POA: Diagnosis not present

## 2017-10-09 DIAGNOSIS — R7303 Prediabetes: Secondary | ICD-10-CM | POA: Diagnosis not present

## 2017-10-09 DIAGNOSIS — R3915 Urgency of urination: Secondary | ICD-10-CM | POA: Diagnosis not present

## 2017-10-09 DIAGNOSIS — F3342 Major depressive disorder, recurrent, in full remission: Secondary | ICD-10-CM | POA: Diagnosis not present

## 2017-10-10 DIAGNOSIS — M47816 Spondylosis without myelopathy or radiculopathy, lumbar region: Secondary | ICD-10-CM | POA: Diagnosis not present

## 2017-10-10 DIAGNOSIS — F4310 Post-traumatic stress disorder, unspecified: Secondary | ICD-10-CM | POA: Diagnosis not present

## 2017-10-10 DIAGNOSIS — F411 Generalized anxiety disorder: Secondary | ICD-10-CM | POA: Diagnosis not present

## 2017-10-10 LAB — CYTOLOGY - PAP: Diagnosis: NEGATIVE

## 2017-10-17 ENCOUNTER — Ambulatory Visit (INDEPENDENT_AMBULATORY_CARE_PROVIDER_SITE_OTHER): Payer: PPO | Admitting: Orthopaedic Surgery

## 2017-10-24 DIAGNOSIS — F411 Generalized anxiety disorder: Secondary | ICD-10-CM | POA: Diagnosis not present

## 2017-10-24 DIAGNOSIS — F4310 Post-traumatic stress disorder, unspecified: Secondary | ICD-10-CM | POA: Diagnosis not present

## 2017-10-30 ENCOUNTER — Ambulatory Visit (INDEPENDENT_AMBULATORY_CARE_PROVIDER_SITE_OTHER): Payer: PPO

## 2017-10-30 ENCOUNTER — Encounter (INDEPENDENT_AMBULATORY_CARE_PROVIDER_SITE_OTHER): Payer: Self-pay | Admitting: Orthopaedic Surgery

## 2017-10-30 ENCOUNTER — Ambulatory Visit (INDEPENDENT_AMBULATORY_CARE_PROVIDER_SITE_OTHER): Payer: PPO | Admitting: Orthopaedic Surgery

## 2017-10-30 VITALS — BP 145/81 | HR 68 | Ht 66.25 in | Wt 170.0 lb

## 2017-10-30 DIAGNOSIS — G8929 Other chronic pain: Secondary | ICD-10-CM

## 2017-10-30 DIAGNOSIS — M25562 Pain in left knee: Secondary | ICD-10-CM

## 2017-10-30 DIAGNOSIS — M170 Bilateral primary osteoarthritis of knee: Secondary | ICD-10-CM | POA: Diagnosis not present

## 2017-10-30 DIAGNOSIS — M25561 Pain in right knee: Secondary | ICD-10-CM

## 2017-10-30 MED ORDER — BUPIVACAINE HCL 0.25 % IJ SOLN
4.0000 mL | INTRAMUSCULAR | Status: AC | PRN
  Administered 2017-10-30: 4 mL via INTRA_ARTICULAR

## 2017-10-30 MED ORDER — LIDOCAINE HCL 1 % IJ SOLN
0.5000 mL | INTRAMUSCULAR | Status: AC | PRN
  Administered 2017-10-30: .5 mL

## 2017-10-30 MED ORDER — METHYLPREDNISOLONE ACETATE 40 MG/ML IJ SUSP
40.0000 mg | INTRAMUSCULAR | Status: AC | PRN
  Administered 2017-10-30: 40 mg via INTRA_ARTICULAR

## 2017-10-30 MED ORDER — BUPIVACAINE HCL 0.5 % IJ SOLN
3.0000 mL | INTRAMUSCULAR | Status: AC | PRN
  Administered 2017-10-30: 3 mL via INTRA_ARTICULAR

## 2017-10-30 NOTE — Progress Notes (Signed)
Office Visit Note   Patient: Bonnie Juarez           Date of Birth: Oct 02, 1945           MRN: [id number] Visit Date: 10/30/2017              Requested by: Cari Caraway, [place], [place] [address] PCP: Cari Caraway, MD   Assessment & Plan: Visit Diagnoses:  1. Chronic pain of both knees   2. Bilateral primary osteoarthritis of knee     Plan: Bilateral knee intra-articular injection performed with good relief.  She can follow-up if she has increased problems.  She has been on Forteo now for almost 2 years and I discussed with her some literature that recommends a 38-month holiday off of all osteoporosis treatment to give the bone time to remodel.  If her bone density test remains abnormal then she could restart treatment after 66-month holiday and this is being followed by Dr. Theadore Nan.  She can return if she has increased problems with her knee.  Follow-Up Instructions: Return if symptoms worsen or fail to improve.   Orders:  Orders Placed This Encounter  Procedures  . Large Joint Inj  . Large Joint Inj  . XR Knee 1-2 Views Right  . XR Knee 1-2 Views Left   No orders of the defined types were placed in this encounter.     Procedures: Large Joint Inj: R knee on 10/30/2017 4:50 PM Indications: pain and joint swelling Details: 22 G 1.5 in needle, anterolateral approach  Arthrogram: No  Medications: 40 mg methylPREDNISolone acetate 40 MG/ML; 0.5 mL lidocaine 1 %; 4 mL bupivacaine 0.25 % Outcome: tolerated well, no immediate complications Procedure, treatment alternatives, risks and benefits explained, specific risks discussed. Consent was given by the patient. Immediately prior to procedure a time out was called to verify the correct patient, procedure, equipment, support staff and site/side marked as required. Patient was prepped and draped in the usual sterile fashion.   Large Joint Inj: L knee on 10/30/2017 4:50 PM Indications: joint swelling and  pain Details: 22 G 1.5 in needle, anterolateral approach  Arthrogram: No  Medications: 0.5 mL lidocaine 1 %; 3 mL bupivacaine 0.5 %; 40 mg methylPREDNISolone acetate 40 MG/ML Outcome: tolerated well, no immediate complications Procedure, treatment alternatives, risks and benefits explained, specific risks discussed. Consent was given by the patient. Immediately prior to procedure a time out was called to verify the correct patient, procedure, equipment, support staff and site/side marked as required. Patient was prepped and draped in the usual sterile fashion.       Clinical Data: No additional findings.   Subjective: Chief Complaint  Patient presents with  . Left Knee - Pain  . Right Knee - Pain    HPI 72 year old female returns with bilateral knee problems worse on the right than left knee with right knee valgus.  She has had a knee injection several years ago and got good relief.  Knees bother her with activity she had some injections the past several years ago.  She had been on prednisone for about 3 years.  She has been on Forteo injections for 2 years and has upcoming bone density within a few weeks.  Posterior decompressive surgery with instrumented fusion lateral mass C2-T2 done by Dr. Sherley Bounds .  Previous [place] procedure by Dr. Eddie Dibbles long many years ago.  ORIF proximal humerus fracture doing well.  She is requesting repeat knee injections.  Review of Systems positive for osteoporosis, low back pain, fibromyalgia, previous cervical fusion anteriorly with pseudoarthrosis.  Posterior decompression and lateral mass screw fixation.  Previous proximal humerus fracture.   Objective: Vital Signs: BP (!) 145/81   Pulse 68   Ht 5' 6.25" (1.683 m)   Wt 170 lb (77.1 kg)   BMI 27.23 kg/m   Physical Exam  Constitutional: She is oriented to person, place, and time. She appears well-developed.  HENT:  Head: Normocephalic.  Right Ear: External ear normal.  Left Ear: External ear  normal.  Eyes: Pupils are equal, round, and reactive to light.  Neck: No tracheal deviation present. No thyromegaly present.  Cardiovascular: Normal rate.  Pulmonary/Chest: Effort normal.  Abdominal: Soft.  Neurological: She is alert and oriented to person, place, and time.  Skin: Skin is warm and dry.  Psychiatric: She has a normal mood and affect. Her behavior is normal.    Ortho Exam well-healed anterior and posterior cervical incisions.  Good sensation to her fingertips and hands.  Standing position mild valgus right knee left knee is straight.  Crepitus with knee range of motion worse on the right than left knee.  Pedal pulses are intact.  Negative logroll to the hips.  Specialty Comments:  No specialty comments available.  Imaging: Xr Knee 1-2 Views Left  Result Date: 10/30/2017 Standing AP both knees lateral left knee obtained and reviewed.  There is minimal lateral narrowing , no significant valgus compared to the opposite right knee that shows some mild valgus deformity.  Negative for acute changes. Impression: Mild left knee joint space narrowing without varus or valgus deformity.  Xr Knee 1-2 Views Right  Result Date: 10/30/2017 Standing AP both knees lateral right knee obtained and reviewed.  This shows 10 degrees valgus right knee with more lateral than medial joint line narrowing mild spurring.  Negative for acute fracture. Impression: Right knee osteoarthritis with some valgus deformity.    PMFS History: Patient Active Problem List   Diagnosis Date Noted  . S/P cervical spinal fusion 12/20/2016  . Spondylosis without myelopathy or radiculopathy, lumbar region 10/05/2016  . Chronic bilateral low back pain without sciatica 10/05/2016  . Fibromyalgia 10/05/2016  . Closed fracture of right proximal humerus 01/08/2013    Class: Acute   Past Medical History:  Diagnosis Date  . Anxiety   . Arthritis   . Cancer ([place])    Hx: of squamouscell carcinoma on Left shin basal  cell on face  . Complication of anesthesia    12-12-03 deceased LOC and hypoxia overmedicating with Morphine and Phenergan; received Narcan X 2, overnight pulse ox  . Depression   . Fibromyalgia   . GERD (gastroesophageal reflux disease)    Hx: of  . H/O hiatal hernia    PMH: of  . Headache(784.0)   . Hypertension   . IBS (irritable bowel syndrome)    Hx: of  . Osteopenia    Hx: of per pt, pt. adds osteoporosis- DOS  . Pneumonia    hx  . PONV (postoperative nausea and vomiting)   . Sleep apnea    Hx: of in the past-?mild apnea 10 yrs ago- followed by dr and none found    Family History  Problem Relation Age of Onset  . Heart disease Other   . Lung disease Other     Past Surgical History:  Procedure Laterality Date  . APPENDECTOMY    . ARCUATE KERATECTOMY    . BREAST LUMPECTOMY Right 1991  .  CERVICAL FUSION  1983  . FRACTURE SURGERY Right 2014   humerus  . HERNIA REPAIR     hiatal  . KNEE ARTHROSCOPY Right 2015  . [place] PLACATION  2010  . ORIF HUMERUS FRACTURE Right 01/08/2013   Procedure: OPEN REDUCTION INTERNAL FIXATION (ORIF) PROXIMAL HUMERUS FRACTURE;  Surgeon: Marybelle Killings, MD;  Location: [facility];  Service: Orthopedics;  Laterality: Right;  Open Reduction Internal Fixation Right Proximal Humerus Fracture  . POSTERIOR CERVICAL FUSION/FORAMINOTOMY N/A 12/20/2016   Procedure: Posterior Cervical Fusion with lateral mass fixation - Cervical Three-Thoracic Two;  Surgeon: Eustace Moore, MD;  Location: [facility];  Service: Neurosurgery;  Laterality: N/A;  . RADIAL HEAD REPLACEMENT  2005  . trigger thrumb release Left 2001  . tubular plasity  1970   Social History   Occupational History  . Not on file  Tobacco Use  . Smoking status: Former Smoker    Packs/day: 1.00    Years: 15.00    Pack years: 15.00    Types: Cigarettes    Last attempt to quit: 12/12/1980    Years since quitting: 36.9  . Smokeless tobacco: Never Used  . Tobacco comment: Quit 1982"  Substance  and Sexual Activity  . Alcohol use: Yes    Alcohol/week: 7.0 - 10.0 standard drinks    Types: 7 - 10 Glasses of wine per week  . Drug use: No  . Sexual activity: Not on file

## 2017-11-05 DIAGNOSIS — H01111 Allergic dermatitis of right upper eyelid: Secondary | ICD-10-CM | POA: Diagnosis not present

## 2017-11-07 DIAGNOSIS — F4310 Post-traumatic stress disorder, unspecified: Secondary | ICD-10-CM | POA: Diagnosis not present

## 2017-11-07 DIAGNOSIS — F411 Generalized anxiety disorder: Secondary | ICD-10-CM | POA: Diagnosis not present

## 2017-11-13 ENCOUNTER — Encounter: Payer: Self-pay | Admitting: Internal Medicine

## 2017-11-13 ENCOUNTER — Ambulatory Visit: Payer: PPO | Admitting: Internal Medicine

## 2017-11-13 VITALS — BP 122/62 | HR 71 | Ht 66.0 in | Wt 177.6 lb

## 2017-11-13 DIAGNOSIS — Z789 Other specified health status: Secondary | ICD-10-CM | POA: Diagnosis not present

## 2017-11-13 DIAGNOSIS — E782 Mixed hyperlipidemia: Secondary | ICD-10-CM

## 2017-11-13 DIAGNOSIS — Z8249 Family history of ischemic heart disease and other diseases of the circulatory system: Secondary | ICD-10-CM

## 2017-11-13 DIAGNOSIS — M5416 Radiculopathy, lumbar region: Secondary | ICD-10-CM | POA: Diagnosis not present

## 2017-11-13 DIAGNOSIS — M48062 Spinal stenosis, lumbar region with neurogenic claudication: Secondary | ICD-10-CM | POA: Diagnosis not present

## 2017-11-13 NOTE — Patient Instructions (Signed)
Medication Instructions:   Take Crestor 5mg  once weekly  Dr. Debara Pickett recommends Repatha - once every 14 days Once your calcium score is completed, we will work on a prior authorization for this medication if needed. This medication will be dispensed from either a local pharmacy or specialty mail order pharmacy or the [employer] patient assistance program if you qualify (application provided)  Labwork:  Your physician recommends that you return for lab work in 3 months (prior to next visit with Dr. Debara Pickett)   Testing/Procedures:  Dr. Debara Pickett has ordered a CT coronary calcium score. This test is done at [address]. This is $150 out of pocket.  Follow-Up:  Your physician recommends that you schedule a follow-up appointment in lipid clinic with Dr. Debara Pickett in 3 months.   If you need a refill on your cardiac medications before your next appointment, please call your pharmacy.  Any Other Special Instructions Will Be Listed Below (If Applicable).   Coronary CalciumScan A coronary calcium scan is an imaging test used to look for deposits of calcium and other fatty materials (plaques) in the inner lining of the blood vessels of the heart (coronary arteries). These deposits of calcium and plaques can partly clog and narrow the coronary arteries without producing any symptoms or warning signs. This puts a person at risk for a heart attack. This test can detect these deposits before symptoms develop. Tell a health care provider about:  Any allergies you have.  All medicines you are taking, including vitamins, herbs, eye drops, creams, and over-the-counter medicines.  Any problems you or family members have had with anesthetic medicines.  Any blood disorders you have.  Any surgeries you have had.  Any medical conditions you have.  Whether you are pregnant or may be pregnant. What are the risks? Generally, this is a safe procedure. However, problems may occur,  including:  Harm to a pregnant woman and her unborn baby. This test involves the use of radiation. Radiation exposure can be dangerous to a pregnant woman and her unborn baby. If you are pregnant, you generally should not have this procedure done.  Slight increase in the risk of cancer. This is because of the radiation involved in the test. What happens before the procedure? No preparation is needed for this procedure. What happens during the procedure?  You will undress and remove any jewelry around your neck or chest.  You will put on a hospital gown.  Sticky electrodes will be placed on your chest. The electrodes will be connected to an electrocardiogram (ECG) machine to record a tracing of the electrical activity of your heart.  A CT scanner will take pictures of your heart. During this time, you will be asked to lie still and hold your breath for 2-3 seconds while a picture of your heart is being taken. The procedure may vary among health care providers and hospitals. What happens after the procedure?  You can get dressed.  You can return to your normal activities.  It is up to you to get the results of your test. Ask your health care provider, or the department that is doing the test, when your results will be ready. Summary  A coronary calcium scan is an imaging test used to look for deposits of calcium and other fatty materials (plaques) in the inner lining of the blood vessels of the heart (coronary arteries).  Generally, this is a safe procedure. Tell your health care provider if you are  pregnant or may be pregnant.  No preparation is needed for this procedure.  A CT scanner will take pictures of your heart.  You can return to your normal activities after the scan is done. This information is not intended to replace advice given to you by your health care provider. Make sure you discuss any questions you have with your health care provider. Document Released: 08/12/2007  Document Revised: 01/03/2016 Document Reviewed: 01/03/2016 Elsevier Interactive Patient Education  2017 [employer].

## 2017-11-13 NOTE — Progress Notes (Signed)
OFFICE CONSULT NOTE  Chief Complaint:  Dyslipidemia  Primary Care Physician: Cari Caraway, MD  HPI:  Bonnie Juarez is a 72 y.o. female who is being seen today for the evaluation of dyslipidemia at the request of Cari Caraway, MD. This is a pleasant 72 year old female patient is kindly referred to me for evaluation of dyslipidemia.  She has a strong family history of heart disease mostly in her mother who had 2 stents at age 1 and a stroke in her 30s.  There is also history of heart failure diabetes and possible heart attack in her siblings.  Personal medical problems include fibromyalgia and problems with her cervical spine which is been operated on, dyslipidemia, hypertension and history of melanoma.  She currently takes rosuvastatin 10 mg once weekly which is the most medicine she can tolerate.  In the past she is taken atorvastatin which caused significant myalgias as well as pravastatin which she tolerated however did not have significant improvement in her dyslipidemia.  She says this significantly affects her fibromyalgia.  Based on these numbers are most recent lipid profile in August showed total cholesterol 250, triglycerides 103, HDL 76 and LDL 154.  Although she has no known coronary disease, given her age, hypertension and family history of coronary disease, her goal LDL is likely less than 100.  At this point she is unlikely to reach that with additional therapies other than likely a PCSK9 inhibitor.  She reports being asymptomatic as far as no chest pain or worsening shortness of breath.  PMHx:  Past Medical History:  Diagnosis Date  . Anxiety   . Arthritis   . Cancer ([place])    Hx: of squamouscell carcinoma on Left shin basal cell on face  . Complication of anesthesia    2003/12/18 deceased LOC and hypoxia overmedicating with Morphine and Phenergan; received Narcan X 2, overnight pulse ox  . Depression   . Fibromyalgia   . GERD (gastroesophageal reflux disease)    Hx: of  .  H/O hiatal hernia    PMH: of  . Headache(784.0)   . Hypertension   . IBS (irritable bowel syndrome)    Hx: of  . Osteopenia    Hx: of per pt, pt. adds osteoporosis- DOS  . Pneumonia    hx  . PONV (postoperative nausea and vomiting)   . Sleep apnea    Hx: of in the past-?mild apnea 10 yrs ago- followed by dr and none found    Past Surgical History:  Procedure Laterality Date  . APPENDECTOMY    . ARCUATE KERATECTOMY    . BREAST LUMPECTOMY Right 1991  . CERVICAL FUSION  1983  . FRACTURE SURGERY Right 2014   humerus  . HERNIA REPAIR     hiatal  . KNEE ARTHROSCOPY Right 2015  . [place] PLACATION  2010  . ORIF HUMERUS FRACTURE Right 01/08/2013   Procedure: OPEN REDUCTION INTERNAL FIXATION (ORIF) PROXIMAL HUMERUS FRACTURE;  Surgeon: Marybelle Killings, MD;  Location: [facility] [place];  Service: Orthopedics;  Laterality: Right;  Open Reduction Internal Fixation Right Proximal Humerus Fracture  . POSTERIOR CERVICAL FUSION/FORAMINOTOMY N/A 12/20/2016   Procedure: Posterior Cervical Fusion with lateral mass fixation - Cervical Three-Thoracic Two;  Surgeon: Eustace Moore, MD;  Location: [facility];  Service: Neurosurgery;  Laterality: N/A;  . RADIAL HEAD REPLACEMENT  2005  . trigger thrumb release Left 2001  . tubular plasity  1970    FAMHx:  Family History  Problem Relation Age of Onset  .  Heart disease Other   . Lung disease Other     SOCHx:   reports that she quit smoking about 36 years ago. Her smoking use included cigarettes. She has a 15.00 pack-year smoking history. She has never used smokeless tobacco. She reports that she drinks about 7.0 - 10.0 standard drinks of alcohol per week. She reports that she does not use drugs.  ALLERGIES:  Allergies  Allergen Reactions  . Boniva [Ibandronic Acid] Other (See Comments)    Bone Pain  . Dilaudid  [Hydromorphone Hcl] Other (See Comments)  . Risedronate Sodium Other (See Comments)    ROS: Pertinent items noted in HPI and remainder of  comprehensive ROS otherwise negative.  HOME MEDS: Current Outpatient Medications on File Prior to Visit  Medication Sig Dispense Refill  . cholecalciferol (VITAMIN D) 1000 UNITS tablet Take 4,000 Units by mouth every morning.     . Coenzyme Q10 (COQ10) 100 MG CAPS Take 1 capsule by mouth daily.    Marland Kitchen conjugated estrogens (PREMARIN) vaginal cream Place 1 g vaginally 3 (three) times a week.     . escitalopram (LEXAPRO) 20 MG tablet TAKE 1/2 TABLET BY MOUTH DAILY FOR 1 WEEK THEN 1 DAILY  2  . fluticasone (FLONASE) 50 MCG/ACT nasal spray Place 2 sprays into the nose daily as needed for rhinitis.    Marland Kitchen losartan (COZAAR) 50 MG tablet Take 50 mg by mouth daily.    . methocarbamol (ROBAXIN) 500 MG tablet Take 1 tablet (500 mg total) by mouth every 6 (six) hours as needed for muscle spasms. 60 tablet 1  . mirabegron ER (MYRBETRIQ) 25 MG TB24 tablet Take 25 mg by mouth daily.    Marland Kitchen OVER THE COUNTER MEDICATION Take 1 capsule by mouth 2 (two) times daily. CBD OIL Caps 25 mg per capsule    . rosuvastatin (CRESTOR) 10 MG tablet Take 5 mg by mouth once a week.   1  . traZODone (DESYREL) 50 MG tablet TAKE 1/2 TO 2 TABLETS BY MOUTH AT BEDTIME DAILY  2  . vitamin B-12 (CYANOCOBALAMIN) 1000 MCG tablet Take 2,000 mcg by mouth every morning.     No current facility-administered medications on file prior to visit.     LABS/IMAGING: No results found for this or any previous visit (from the past 48 hour(s)). No results found.  LIPID PANEL: No results found for: CHOL, TRIG, HDL, CHOLHDL, VLDL, LDLCALC, LDLDIRECT  WEIGHTS: Wt Readings from Last 3 Encounters:  11/13/17 177 lb 9.6 oz (80.6 kg)  10/30/17 170 lb (77.1 kg)  12/20/16 179 lb (81.2 kg)    VITALS: BP 122/62   Pulse 71   Ht 5\' 6"  (1.676 m)   Wt 177 lb 9.6 oz (80.6 kg)   SpO2 97%   BMI 28.67 kg/m   EXAM: General appearance: alert and no distress Neck: no carotid bruit, no JVD and thyroid not enlarged, symmetric, no  tenderness/mass/nodules Lungs: clear to auscultation bilaterally Heart: regular rate and rhythm Abdomen: soft, non-tender; bowel sounds normal; no masses,  no organomegaly Extremities: extremities normal, atraumatic, no cyanosis or edema Pulses: 2+ and symmetric Skin: Skin color, texture, turgor normal. No rashes or lesions Neurologic: Grossly normal Psych: Pleasant  EKG: Deferred  ASSESSMENT: 1. Mixed dyslipidemia 2. Strong family history of coronary disease 3. Hypertension 4. Relative statin intolerance  PLAN: 1.   Ms. Mulvehill has relative statin intolerance and that the statins have significantly exacerbated her fibromyalgia.  She has not had significant reduction and can only tolerate  10 mg q. weekly.  At this point that is actually causing her significant myalgias.  I suggested that she decrease the dose down to 5 mg daily.  Given her risk factors I think she is at least at intermediate to high risk of cardiovascular vascular events.  I would recommend a coronary artery calcium score to further risk stratify her.  If this is significantly elevated, then I think there is strong evidence to consider the addition of PCSK9 inhibitor therapy, particularly in the setting of statin intolerance and inability to reach goal LDL on her current medications.  Thanks for the kind referral.  She will follow-up with me to discuss results shortly.  Pixie Casino, MD, [facility], [place] Director of the Advanced Lipid Disorders &  Cardiovascular Risk Reduction Clinic Diplomate of the American Board of Clinical Lipidology Attending Cardiologist  Direct Dial: [phone]  Fax: [phone]  Website:  [email or web address] 11/13/2017, 5:50 PM

## 2017-11-19 DIAGNOSIS — D1800 Hemangioma unspecified site: Secondary | ICD-10-CM | POA: Diagnosis not present

## 2017-11-19 DIAGNOSIS — L821 Other seborrheic keratosis: Secondary | ICD-10-CM | POA: Diagnosis not present

## 2017-11-19 DIAGNOSIS — R202 Paresthesia of skin: Secondary | ICD-10-CM | POA: Diagnosis not present

## 2017-11-19 DIAGNOSIS — Z85828 Personal history of other malignant neoplasm of skin: Secondary | ICD-10-CM | POA: Diagnosis not present

## 2017-11-19 DIAGNOSIS — H01111 Allergic dermatitis of right upper eyelid: Secondary | ICD-10-CM | POA: Diagnosis not present

## 2017-11-19 DIAGNOSIS — D225 Melanocytic nevi of trunk: Secondary | ICD-10-CM | POA: Diagnosis not present

## 2017-11-19 DIAGNOSIS — L814 Other melanin hyperpigmentation: Secondary | ICD-10-CM | POA: Diagnosis not present

## 2017-11-19 DIAGNOSIS — Z85820 Personal history of malignant melanoma of skin: Secondary | ICD-10-CM | POA: Diagnosis not present

## 2017-11-19 DIAGNOSIS — Z86018 Personal history of other benign neoplasm: Secondary | ICD-10-CM | POA: Diagnosis not present

## 2017-11-19 DIAGNOSIS — L820 Inflamed seborrheic keratosis: Secondary | ICD-10-CM | POA: Diagnosis not present

## 2017-11-19 DIAGNOSIS — L570 Actinic keratosis: Secondary | ICD-10-CM | POA: Diagnosis not present

## 2017-11-20 DIAGNOSIS — M810 Age-related osteoporosis without current pathological fracture: Secondary | ICD-10-CM | POA: Diagnosis not present

## 2017-11-20 DIAGNOSIS — Z79899 Other long term (current) drug therapy: Secondary | ICD-10-CM | POA: Diagnosis not present

## 2017-11-20 DIAGNOSIS — M8588 Other specified disorders of bone density and structure, other site: Secondary | ICD-10-CM | POA: Diagnosis not present

## 2017-11-21 ENCOUNTER — Ambulatory Visit (INDEPENDENT_AMBULATORY_CARE_PROVIDER_SITE_OTHER)
Admission: RE | Admit: 2017-11-21 | Discharge: 2017-11-21 | Disposition: A | Discharge status: Home / Self Care | Payer: Self-pay | Source: Ambulatory Visit | Attending: Internal Medicine | Admitting: Internal Medicine

## 2017-11-21 DIAGNOSIS — F411 Generalized anxiety disorder: Secondary | ICD-10-CM | POA: Diagnosis not present

## 2017-11-21 DIAGNOSIS — F4310 Post-traumatic stress disorder, unspecified: Secondary | ICD-10-CM | POA: Diagnosis not present

## 2017-11-21 DIAGNOSIS — E782 Mixed hyperlipidemia: Secondary | ICD-10-CM

## 2017-11-21 DIAGNOSIS — Z8249 Family history of ischemic heart disease and other diseases of the circulatory system: Secondary | ICD-10-CM

## 2017-11-22 NOTE — Telephone Encounter (Signed)
Called patient and reviewed her CT scan results on 9/26 at 4:45 pm.  Pixie Casino, MD, [facility], [place] Director of the Advanced Lipid Disorders &  Cardiovascular Risk Reduction Clinic Diplomate of the American Board of Clinical Lipidology Attending Cardiologist  Direct Dial: [phone]  Fax: [phone]  Website:  [email or web address]

## 2017-11-23 ENCOUNTER — Telehealth: Payer: Self-pay | Admitting: Internal Medicine

## 2017-11-23 NOTE — Telephone Encounter (Signed)
PA for repatha submitted via [email or web address] Key: [id number]  Currently on max-tolerated crestor 5mg  once week Tried/failed atorvastatin, pravastatin, lovastatin Elevated CAC score - ASCVD, family history heart disease

## 2017-11-27 DIAGNOSIS — F411 Generalized anxiety disorder: Secondary | ICD-10-CM | POA: Diagnosis not present

## 2017-11-27 DIAGNOSIS — F4310 Post-traumatic stress disorder, unspecified: Secondary | ICD-10-CM | POA: Diagnosis not present

## 2017-11-27 NOTE — Telephone Encounter (Signed)
Received fax coverage request form from [employer] and coverage request status (denied). MD completed request form - explaining that patient has clinical ASCVD with coronary & aorta calcifications and a CAC of 80, she is on max tolerated crestor 5mg  once/weekly, reiterated labs of LDL 154, HDL 76, Trigs 103, total chol 250 from 09/2017 (which was incorrectly recorded by [employer] as LDL 76).   This form with request status (denied) and MD office note, last labs, coronary calcium score were all faxed to [phone]. MD note, labs & calcium score were previously uploaded to initial request submitted via [email or web address]

## 2017-11-29 NOTE — Telephone Encounter (Signed)
Received fax notice from [employer] that patient has been approved for Repatha SureClick 140mg /mL from 11/28/17 - 11/28/18

## 2017-11-30 NOTE — Telephone Encounter (Signed)
Patient called and notified she was approved for Repatha. She states that based on the income criteria she would qualify for patient assistance. A link to the application + instructions was sent to patient in MyChart. She plans to work on the application and return shortly.

## 2017-12-03 DIAGNOSIS — M5416 Radiculopathy, lumbar region: Secondary | ICD-10-CM | POA: Diagnosis not present

## 2017-12-03 NOTE — Telephone Encounter (Signed)
Patient dropped off paperwork today for [employer]. There was one question left unanswered. MyChart message was sent to patient asking for response to this question so application can be submitted.

## 2017-12-04 ENCOUNTER — Telehealth: Payer: Self-pay | Admitting: Internal Medicine

## 2017-12-04 NOTE — Telephone Encounter (Signed)
Pt called in with information about Repatha.  Medicare start dates are 08/27/2008 & 02/27/2009.   She also has questions about Repatha medication called in stated it was $270.00 dollars (co-pay) should she pick it up or wait to see if she gets approval from repatha. Message was routed to Sheral Apley, Therapist, sports for Dr.Hilty.

## 2017-12-05 NOTE — Telephone Encounter (Signed)
Faxed to [employer] repatha patient assistance application ([phone])

## 2017-12-05 NOTE — Telephone Encounter (Signed)
Called patient about Repatha. Explained that no Rx has been sent to her pharmacy yet and that per our previous conversation, would submit [employer] Safety Net Application for patient assistance, as she feels she would qualify. Updated application with Medicare date(s).

## 2017-12-06 DIAGNOSIS — M5416 Radiculopathy, lumbar region: Secondary | ICD-10-CM | POA: Diagnosis not present

## 2017-12-06 DIAGNOSIS — M545 Low back pain: Secondary | ICD-10-CM | POA: Diagnosis not present

## 2017-12-07 NOTE — Telephone Encounter (Signed)
Received fax from [employer] that proof of prior auth approval status is needed and application is placed on hold. Copy of [employer] approval letter was sent on 10/9. Re-faxed application + approval letter to [employer].

## 2017-12-10 ENCOUNTER — Other Ambulatory Visit: Payer: Self-pay | Admitting: Neurological Surgery

## 2017-12-10 DIAGNOSIS — M4316 Spondylolisthesis, lumbar region: Secondary | ICD-10-CM | POA: Diagnosis not present

## 2017-12-11 NOTE — Telephone Encounter (Signed)
Received fax that patient has been approved for Repatha free of charge from 02/27/2018 - 02/27/2019.

## 2017-12-11 NOTE — Telephone Encounter (Signed)
Spoke with [employer] rep and there was error on faxed letter. Patient has been approved for patient assistance thru end of 2019. They will contact patient and fax a new letter to our office.

## 2017-12-12 ENCOUNTER — Telehealth: Payer: Self-pay

## 2017-12-12 DIAGNOSIS — F411 Generalized anxiety disorder: Secondary | ICD-10-CM | POA: Diagnosis not present

## 2017-12-12 DIAGNOSIS — F4310 Post-traumatic stress disorder, unspecified: Secondary | ICD-10-CM | POA: Diagnosis not present

## 2017-12-12 NOTE — Telephone Encounter (Signed)
   [facility] Pre-operative Risk Assessment    Request for surgical clearance:  1. What type of surgery is being performed? L4-5 Posterior Lumbar Fusion    2. When is this surgery scheduled?  02/06/18   3. What type of clearance is required (medical clearance vs. Pharmacy clearance to hold med vs. Both)? Medical  4. Are there any medications that need to be held prior to surgery and how long? None listed   5. Practice name and name of physician performing surgery?  [facility]    6. What is your office phone number [phone]    7.   What is your office fax number [phone]  8.   Anesthesia type (None, local, MAC, general) ? General    Meryl Crutch 12/12/2017, 2:17 PM  _________________________________________________________________   (provider comments below)

## 2017-12-12 NOTE — Telephone Encounter (Signed)
LM for patient concerning Repatha patient approval status and provided phone # for [employer] Safety net [phone]

## 2017-12-13 NOTE — Telephone Encounter (Signed)
Low risk for surgery.  Dr. Lemmie Evens

## 2017-12-13 NOTE — Telephone Encounter (Signed)
   Primary Cardiologist: Pixie Casino, MD  Chart reviewed as part of pre-operative protocol coverage. Dr. Debara Pickett, please cardiac clearance. Patient follows with you for HLD. Recent coronary CT showed Coronary calcium score of 70.  Waiting on Repatha.  Please forward your response to P CV DIV PREOP.   Thank you   Leanor Kail, PA 12/13/2017, 3:57 PM

## 2017-12-17 NOTE — Telephone Encounter (Signed)
   Primary Cardiologist:Kenneth C Hilty, MD  Chart reviewed as part of pre-operative protocol coverage. Pre-op clearance already addressed by colleagues in earlier phone notes. Dr. Debara Pickett feels patient is low risk for surgery.   Will route this bundled recommendation to requesting provider via Epic fax function. Please call with questions.  Charlie Pitter, PA-C 12/17/2017, 2:04 PM

## 2018-01-02 DIAGNOSIS — F4310 Post-traumatic stress disorder, unspecified: Secondary | ICD-10-CM | POA: Diagnosis not present

## 2018-01-02 DIAGNOSIS — F411 Generalized anxiety disorder: Secondary | ICD-10-CM | POA: Diagnosis not present

## 2018-01-30 ENCOUNTER — Encounter (HOSPITAL_COMMUNITY)
Admission: RE | Admit: 2018-01-30 | Discharge: 2018-01-30 | Disposition: A | Discharge status: Home / Self Care | Payer: PPO | Source: Ambulatory Visit | Attending: Neurological Surgery | Admitting: Neurological Surgery

## 2018-01-30 ENCOUNTER — Other Ambulatory Visit: Payer: Self-pay

## 2018-01-30 ENCOUNTER — Other Ambulatory Visit (HOSPITAL_COMMUNITY): Payer: PPO

## 2018-01-30 ENCOUNTER — Encounter (HOSPITAL_COMMUNITY): Payer: Self-pay

## 2018-01-30 DIAGNOSIS — M4310 Spondylolisthesis, site unspecified: Secondary | ICD-10-CM | POA: Diagnosis not present

## 2018-01-30 DIAGNOSIS — Z981 Arthrodesis status: Secondary | ICD-10-CM | POA: Insufficient documentation

## 2018-01-30 DIAGNOSIS — Z87891 Personal history of nicotine dependence: Secondary | ICD-10-CM | POA: Insufficient documentation

## 2018-01-30 DIAGNOSIS — R9431 Abnormal electrocardiogram [ECG] [EKG]: Secondary | ICD-10-CM | POA: Insufficient documentation

## 2018-01-30 DIAGNOSIS — I10 Essential (primary) hypertension: Secondary | ICD-10-CM | POA: Diagnosis not present

## 2018-01-30 DIAGNOSIS — Z79899 Other long term (current) drug therapy: Secondary | ICD-10-CM | POA: Insufficient documentation

## 2018-01-30 DIAGNOSIS — F419 Anxiety disorder, unspecified: Secondary | ICD-10-CM | POA: Insufficient documentation

## 2018-01-30 DIAGNOSIS — M797 Fibromyalgia: Secondary | ICD-10-CM | POA: Diagnosis not present

## 2018-01-30 DIAGNOSIS — G4733 Obstructive sleep apnea (adult) (pediatric): Secondary | ICD-10-CM | POA: Insufficient documentation

## 2018-01-30 DIAGNOSIS — Z01818 Encounter for other preprocedural examination: Secondary | ICD-10-CM | POA: Insufficient documentation

## 2018-01-30 HISTORY — DX: Frequency of micturition: R35.0

## 2018-01-30 LAB — BASIC METABOLIC PANEL
Anion gap: 9 (ref 5–15)
BUN: 10 mg/dL (ref 8–23)
CO2: 25 mmol/L (ref 22–32)
Calcium: 9.2 mg/dL (ref 8.9–10.3)
Chloride: 107 mmol/L (ref 98–111)
Creatinine, Ser: 1.07 mg/dL — ABNORMAL HIGH (ref 0.44–1.00)
GFR calc Af Amer: 59 mL/min — ABNORMAL LOW (ref >60)
GFR calc non Af Amer: 51 mL/min — ABNORMAL LOW (ref >60)
Glucose, Bld: 113 mg/dL — ABNORMAL HIGH (ref 70–99)
Potassium: 3.7 mmol/L (ref 3.5–5.1)
Sodium: 141 mmol/L (ref 135–145)

## 2018-01-30 LAB — CBC WITH DIFFERENTIAL/PLATELET
Abs Immature Granulocytes: 0.02 10*3/uL (ref 0.00–0.07)
Basophils Absolute: 0.1 10*3/uL (ref 0.0–0.1)
Basophils Relative: 1 %
Eosinophils Absolute: 0.2 10*3/uL (ref 0.0–0.5)
Eosinophils Relative: 3 %
HCT: 43 % (ref 36.0–46.0)
Hemoglobin: 13.2 g/dL (ref 12.0–15.0)
Immature Granulocytes: 0 %
Lymphocytes Relative: 40 %
Lymphs Abs: 3.6 10*3/uL (ref 0.7–4.0)
MCH: 27.4 pg (ref 26.0–34.0)
MCHC: 30.7 g/dL (ref 30.0–36.0)
MCV: 89.2 fL (ref 80.0–100.0)
Monocytes Absolute: 0.6 10*3/uL (ref 0.1–1.0)
Monocytes Relative: 7 %
Neutro Abs: 4.5 10*3/uL (ref 1.7–7.7)
Neutrophils Relative %: 49 %
Platelets: 267 10*3/uL (ref 150–400)
RBC: 4.82 MIL/uL (ref 3.87–5.11)
RDW: 13.4 % (ref 11.5–15.5)
WBC: 9.1 10*3/uL (ref 4.0–10.5)
nRBC: 0 % (ref 0.0–0.2)

## 2018-01-30 LAB — TYPE AND SCREEN
ABO/RH(D): A POS
Antibody Screen: NEGATIVE

## 2018-01-30 LAB — SURGICAL PCR SCREEN
MRSA, PCR: NEGATIVE
Staphylococcus aureus: NEGATIVE

## 2018-01-30 LAB — PROTIME-INR
INR: 1.01
Prothrombin Time: 13.2 seconds (ref 11.4–15.2)

## 2018-01-30 NOTE — Progress Notes (Signed)
PCP - Dr. Theadore Nan Cardiologist - Dr. Debara Pickett; cardiac clearance in Epic note 12/17/2017  Chest x-ray - 01/30/2018 EKG - 01/30/2018 Stress Test - 09/2012 ECHO - 10/2012 Cardiac Cath - patient denies  Sleep Study - patient stated she had a sleep study 10+ years ago however she has since been told by a different physician she does not have OSA  Anesthesia review: yes, cardiac history  Patient denies shortness of breath, fever, cough and chest pain at PAT appointment   Patient verbalized understanding of instructions that were given to them at the PAT appointment. Patient was also instructed that they will need to review over the PAT instructions again at home before surgery.

## 2018-01-30 NOTE — Pre-Procedure Instructions (Signed)
Bonnie Juarez  01/30/2018       [id number] [place], [place] - [address] [place] [address] Phone: [phone] Fax: [phone]    Your procedure is scheduled on 02/06/2018.  Report to [facility] Admitting at [place].M.  Call this number if you have problems the morning of surgery:  [phone]   Remember:  Do not eat or drink after midnight.     Take these medicines the morning of surgery with A SIP OF WATER: escitalopram (Lexapro) Fluticasone (Flonase) - if needed  7 days prior to surgery STOP taking any Aspirin(unless otherwise instructed by your surgeon), Aleve, Naproxen, Ibuprofen, Motrin, Advil, Goody's, BC's, all herbal medications, fish oil, and all vitamins     Do not wear jewelry, make-up or nail polish.  Do not wear lotions, powders, or perfumes, or deodorant.  Do not shave 48 hours prior to surgery.  Men may shave face and neck.  Do not bring valuables to the hospital.  [facility] is not responsible for any belongings or valuables.  Contacts, eyeglasses, hearing aids, dentures or bridgework may not be worn into surgery.  Leave your suitcase in the car.  After surgery it may be brought to your room.  For patients admitted to the hospital, discharge time will be determined by your treatment team.  Patients discharged the day of surgery will not be allowed to drive home.   Name and phone number of your driver:    Special instructions:   [place]- Preparing For Surgery  Before surgery, you can play an important role. Because skin is not sterile, your skin needs to be as free of germs as possible. You can reduce the number of germs on your skin by washing with CHG (chlorahexidine gluconate) Soap before surgery.  CHG is an antiseptic cleaner which kills germs and bonds with the skin to continue killing germs even after washing.    Oral Hygiene is also important to reduce your risk of infection.  Remember - BRUSH YOUR  TEETH THE MORNING OF SURGERY WITH YOUR REGULAR TOOTHPASTE  Please do not use if you have an allergy to CHG or antibacterial soaps. If your skin becomes reddened/irritated stop using the CHG.  Do not shave (including legs and underarms) for at least 48 hours prior to first CHG shower. It is OK to shave your face.  Please follow these instructions carefully.   1. Shower the NIGHT BEFORE SURGERY and the MORNING OF SURGERY with CHG.   2. If you chose to wash your hair, wash your hair first as usual with your normal shampoo.  3. After you shampoo, rinse your hair and body thoroughly to remove the shampoo.  4. Use CHG as you would any other liquid soap. You can apply CHG directly to the skin and wash gently with a scrungie or a clean washcloth.   5. Apply the CHG Soap to your body ONLY FROM THE NECK DOWN.  Do not use on open wounds or open sores. Avoid contact with your eyes, ears, mouth and genitals (private parts). Wash Face and genitals (private parts)  with your normal soap.  6. Wash thoroughly, paying special attention to the area where your surgery will be performed.  7. Thoroughly rinse your body with warm water from the neck down.  8. DO NOT shower/wash with your normal soap after using and rinsing off the CHG Soap.  9. Pat yourself dry with a CLEAN TOWEL.  10. Wear CLEAN PAJAMAS to bed the night before surgery, wear comfortable clothes the morning of surgery  11. Place CLEAN SHEETS on your bed the night of your first shower and DO NOT SLEEP WITH PETS.    Day of Surgery: Shower as stated above. Do not apply any deodorants/lotions.  Please wear clean clothes to the hospital/surgery center.   Remember to brush your teeth WITH YOUR REGULAR TOOTHPASTE.    Please read over the following fact sheets that you were given.

## 2018-01-31 NOTE — Progress Notes (Signed)
Anesthesia Chart Review:  Case:  [id number] Date/Time:  02/06/18 0815   Procedure:  PLIF - L4-L5 - Posterior and Interbody fusion (N/A Back)   Anesthesia type:  General   Pre-op diagnosis:  Spondylolisthesis   Location:  [facility] OR ROOM 68 / [facility]   Surgeon:  Eustace Moore, MD      DISCUSSION: Patient is a 72 year old female scheduled for the above procedure.  Medical history includes HTN, anxiety, depression, hiatal hernia, GERD, Nissen fundoplication, IBS, fibromyalgia, skin cancer ([facility]), OSA (reported f/u test did not show OSA), cervical fusion (3 level) '83, former smoker (quit '82), ORIF right humerus fracture 01/08/13, C3-T2 posterior fusion 12/20/16.   Anesthesia history includes: - Post-operative N/V (did better with 2014 shoulder surgery), - After her 11/09/03 right radial head replacement surgery (done under general anesthesia) she awoke of anesthesia in significant pain and felt pain never got under control in the PACU either. She did receive additional medications once roomed, but when her husband arrived she was unresponsive to sternal rub. According the hospitalist consult, she received 29 mg of morphine sulfate and then Phenergan over a short-time course. She developed decreased LOC and hypoxia that responded to two ampules of Narcan. She required overnight pulse oximetry. (She did well with fentanyl with her last colonoscopy.)   - Patient recently had coronary CT that showed Calcium score of 70. Repatha prescribed due to her mixed dyslipidemia with strong family history of CAD and patient's relative statin intolerance. Cardiologist Dr. Debara Pickett felt patient was "Low risk for surgery." If no acute changes then I anticipate that she can proceed as planned.   VS: BP (!) 124/51   Pulse 79   Temp 36.7 C   Resp 20   Ht 5' 6.25" (1.683 m)   Wt 80 kg   SpO2 95%   BMI 28.26 kg/m    PROVIDERS: Cari Caraway, MD is PCP.  Debara Pickett, Chrissie Noa, MD is cardiologist. Previously she saw  Alfonzo Feller, MD in August 2014 for chest pain and had a non ischemic stress test. (These records are scanned under Media tab, Correspondence, 01/08/13.) Patient says EGD ultimately showed what sounds to be severe gastritis.   LABS: Labs reviewed: Acceptable for surgery. (all labs ordered are listed, but only abnormal results are displayed)  Labs Reviewed  BASIC METABOLIC PANEL - Abnormal; Notable for the following components:      Result Value   Glucose, Bld 113 (*)    Creatinine, Ser 1.07 (*)    GFR calc non Af Amer 51 (*)    GFR calc Af Amer 59 (*)    All other components within normal limits  SURGICAL PCR SCREEN  CBC WITH DIFFERENTIAL/PLATELET  PROTIME-INR  TYPE AND SCREEN    IMAGES: CXR 01/30/18: IMPRESSION: No active cardiopulmonary disease.   EKG: 01/30/18: NSR. LAD. Inferior infarct (age undetermined). No significant change since last tracing.    CV: CT cardiac/Calcium score 11/21/17 (according to ADDENDUM REPORT): FINDINGS: - Non-cardiac: See separate report from [facility]. - Ascending Aorta: Normal size, moderate diffuse calcifications in the aortic root and descending aorta. - Pericardium: Normal. - Coronary arteries: Normal origin. IMPRESSION: 1. Coronary calcium score of 80. This was 68 percentile for age and sex matched control. 2. Aorta has normal size with moderate diffuse calcifications in the aortic root and descending aorta.  Echo 10/30/12 [facility]): Mild concentric LVH, EF 70.7%, borderline LAE, trace MR, trivial TR, doppler findings suggestive of grade II diastolic dysfunction  with elevated LA pressure.  Nuclear stress test 10/15/12 [facility]): Normal myocardial perfusion in all regions, post stress EF 85%, no regional wall motion abnormalities.   Past Medical History:  Diagnosis Date  . Anxiety   . Arthritis   . Cancer ([place])    Hx: of squamouscell carcinoma on Left shin basal cell on face; melanoma on right lower  leg  . Complication of anesthesia    November 27, 2003 deceased LOC and hypoxia overmedicating with Morphine and Phenergan; received Narcan X 2, overnight pulse ox  . Depression   . Fibromyalgia   . Frequent urination   . GERD (gastroesophageal reflux disease)    Hx: of  . H/O hiatal hernia    PMH: of  . Headache(784.0)   . Hypertension   . IBS (irritable bowel syndrome)    Hx: of  . Osteopenia    Hx: of per pt, pt. adds osteoporosis- DOS  . Pneumonia    hx  . PONV (postoperative nausea and vomiting)   . Sleep apnea    Hx: of in the past-?mild apnea 10 yrs ago- followed by dr and none found    Past Surgical History:  Procedure Laterality Date  . APPENDECTOMY    . ARCUATE KERATECTOMY    . BREAST LUMPECTOMY Right 1991  . CERVICAL FUSION  1983  . FRACTURE SURGERY Right 2014   humerus  . HERNIA REPAIR     hiatal  . KNEE ARTHROSCOPY Right 2015  . [place] PLACATION  2010  . ORIF HUMERUS FRACTURE Right 01/08/2013   Procedure: OPEN REDUCTION INTERNAL FIXATION (ORIF) PROXIMAL HUMERUS FRACTURE;  Surgeon: Marybelle Killings, MD;  Location: [place];  Service: Orthopedics;  Laterality: Right;  Open Reduction Internal Fixation Right Proximal Humerus Fracture  . POSTERIOR CERVICAL FUSION/FORAMINOTOMY N/A 12/20/2016   Procedure: Posterior Cervical Fusion with lateral mass fixation - Cervical Three-Thoracic Two;  Surgeon: Eustace Moore, MD;  Location: [place];  Service: Neurosurgery;  Laterality: N/A;  . RADIAL HEAD REPLACEMENT  2005  . trigger thrumb release Left 2001  . tubular plasity  1970    MEDICATIONS: . Cholecalciferol (VITAMIN D) 50 MCG (2000 UT) tablet  . Coenzyme Q10 (COQ10) 100 MG CAPS  . conjugated estrogens (PREMARIN) vaginal cream  . escitalopram (LEXAPRO) 20 MG tablet  . Evolocumab (REPATHA) 140 MG/ML SOSY  . fluticasone (FLONASE) 50 MCG/ACT nasal spray  . losartan (COZAAR) 50 MG tablet  . methocarbamol (ROBAXIN) 500 MG tablet  . OVER THE COUNTER MEDICATION  . traZODone (DESYREL)  100 MG tablet  . vitamin B-12 (CYANOCOBALAMIN) 1000 MCG tablet   No current facility-administered medications for this encounter.   CBD oil caps daily PRN pain.   George Hugh [facility] Short Stay Center/Anesthesiology Phone [phone] 01/31/2018 6:03 PM

## 2018-02-06 ENCOUNTER — Inpatient Hospital Stay (HOSPITAL_COMMUNITY)
Admission: RE | Admit: 2018-02-06 | Discharge: 2018-02-07 | DRG: 455 | Disposition: A | Discharge status: Home / Self Care | Payer: PPO | Attending: Neurological Surgery | Admitting: Neurological Surgery

## 2018-02-06 ENCOUNTER — Encounter (HOSPITAL_COMMUNITY)
Admission: RE | Disposition: A | Discharge status: Home / Self Care | Payer: Self-pay | Source: Home / Self Care | Attending: Neurological Surgery

## 2018-02-06 ENCOUNTER — Encounter (HOSPITAL_COMMUNITY): Payer: Self-pay | Admitting: Urology

## 2018-02-06 ENCOUNTER — Inpatient Hospital Stay (HOSPITAL_COMMUNITY): Payer: PPO

## 2018-02-06 ENCOUNTER — Inpatient Hospital Stay (HOSPITAL_COMMUNITY): Payer: PPO | Admitting: Vascular Surgery

## 2018-02-06 ENCOUNTER — Inpatient Hospital Stay (HOSPITAL_COMMUNITY): Payer: PPO | Admitting: Certified Registered Nurse Anesthetist

## 2018-02-06 DIAGNOSIS — M48061 Spinal stenosis, lumbar region without neurogenic claudication: Secondary | ICD-10-CM | POA: Diagnosis present

## 2018-02-06 DIAGNOSIS — M797 Fibromyalgia: Secondary | ICD-10-CM | POA: Diagnosis not present

## 2018-02-06 DIAGNOSIS — Z885 Allergy status to narcotic agent status: Secondary | ICD-10-CM | POA: Diagnosis not present

## 2018-02-06 DIAGNOSIS — Z888 Allergy status to other drugs, medicaments and biological substances status: Secondary | ICD-10-CM

## 2018-02-06 DIAGNOSIS — I10 Essential (primary) hypertension: Secondary | ICD-10-CM | POA: Diagnosis present

## 2018-02-06 DIAGNOSIS — M810 Age-related osteoporosis without current pathological fracture: Secondary | ICD-10-CM | POA: Diagnosis present

## 2018-02-06 DIAGNOSIS — F419 Anxiety disorder, unspecified: Secondary | ICD-10-CM | POA: Diagnosis present

## 2018-02-06 DIAGNOSIS — Z79899 Other long term (current) drug therapy: Secondary | ICD-10-CM

## 2018-02-06 DIAGNOSIS — Z981 Arthrodesis status: Secondary | ICD-10-CM | POA: Diagnosis not present

## 2018-02-06 DIAGNOSIS — K219 Gastro-esophageal reflux disease without esophagitis: Secondary | ICD-10-CM | POA: Diagnosis not present

## 2018-02-06 DIAGNOSIS — M1990 Unspecified osteoarthritis, unspecified site: Secondary | ICD-10-CM | POA: Diagnosis present

## 2018-02-06 DIAGNOSIS — M4316 Spondylolisthesis, lumbar region: Secondary | ICD-10-CM | POA: Diagnosis not present

## 2018-02-06 DIAGNOSIS — Z85828 Personal history of other malignant neoplasm of skin: Secondary | ICD-10-CM

## 2018-02-06 DIAGNOSIS — M79661 Pain in right lower leg: Secondary | ICD-10-CM | POA: Diagnosis not present

## 2018-02-06 DIAGNOSIS — Z87891 Personal history of nicotine dependence: Secondary | ICD-10-CM

## 2018-02-06 DIAGNOSIS — M549 Dorsalgia, unspecified: Secondary | ICD-10-CM | POA: Diagnosis not present

## 2018-02-06 DIAGNOSIS — C439 Malignant melanoma of skin, unspecified: Secondary | ICD-10-CM | POA: Diagnosis present

## 2018-02-06 DIAGNOSIS — Z419 Encounter for procedure for purposes other than remedying health state, unspecified: Secondary | ICD-10-CM

## 2018-02-06 DIAGNOSIS — M4326 Fusion of spine, lumbar region: Secondary | ICD-10-CM | POA: Diagnosis not present

## 2018-02-06 DIAGNOSIS — F329 Major depressive disorder, single episode, unspecified: Secondary | ICD-10-CM | POA: Diagnosis not present

## 2018-02-06 SURGERY — POSTERIOR LUMBAR FUSION 1 LEVEL
Anesthesia: General | Site: Spine Lumbar

## 2018-02-06 MED ORDER — CEFAZOLIN SODIUM-DEXTROSE 2-4 GM/100ML-% IV SOLN
2.0000 g | Freq: Three times a day (TID) | INTRAVENOUS | Status: AC
  Administered 2018-02-06 – 2018-02-07 (×2): 2 g via INTRAVENOUS
  Filled 2018-02-06 (×2): qty 100

## 2018-02-06 MED ORDER — METHOCARBAMOL 1000 MG/10ML IJ SOLN: 500.0000 mg | Freq: Four times a day (QID) | INTRAVENOUS | Status: DC | PRN

## 2018-02-06 MED ORDER — MORPHINE SULFATE (PF) 2 MG/ML IV SOLN: 2.0000 mg | INTRAVENOUS | Status: DC | PRN

## 2018-02-06 MED ORDER — THROMBIN 5000 UNITS EX SOLR
CUTANEOUS | Status: AC
  Filled 2018-02-06: qty 5000

## 2018-02-06 MED ORDER — ROCURONIUM BROMIDE 50 MG/5ML IV SOSY
PREFILLED_SYRINGE | INTRAVENOUS | Status: AC
  Filled 2018-02-06: qty 15

## 2018-02-06 MED ORDER — ONDANSETRON HCL 4 MG/2ML IJ SOLN
INTRAMUSCULAR | Status: AC
  Filled 2018-02-06: qty 2

## 2018-02-06 MED ORDER — ACETAMINOPHEN 650 MG RE SUPP: 650.0000 mg | RECTAL | Status: DC | PRN

## 2018-02-06 MED ORDER — NON FORMULARY
Status: DC | PRN
  Administered 2018-02-06: 10 mL

## 2018-02-06 MED ORDER — SODIUM CHLORIDE 0.9% FLUSH: 3.0000 mL | INTRAVENOUS | Status: DC | PRN

## 2018-02-06 MED ORDER — SODIUM CHLORIDE 0.9 % IV SOLN
INTRAVENOUS | Status: DC | PRN
  Administered 2018-02-06: 500 mL

## 2018-02-06 MED ORDER — ONDANSETRON HCL 4 MG PO TABS: 4.0000 mg | ORAL_TABLET | Freq: Four times a day (QID) | ORAL | Status: DC | PRN

## 2018-02-06 MED ORDER — METHOCARBAMOL 500 MG PO TABS
ORAL_TABLET | ORAL | Status: AC
  Filled 2018-02-06: qty 1

## 2018-02-06 MED ORDER — SUGAMMADEX SODIUM 200 MG/2ML IV SOLN
INTRAVENOUS | Status: DC | PRN
  Administered 2018-02-06: 200 mg via INTRAVENOUS

## 2018-02-06 MED ORDER — SENNA 8.6 MG PO TABS
1.0000 | ORAL_TABLET | Freq: Two times a day (BID) | ORAL | Status: DC
  Administered 2018-02-06 (×2): 8.6 mg via ORAL
  Filled 2018-02-06 (×2): qty 1

## 2018-02-06 MED ORDER — PHENOL 1.4 % MT LIQD: 1.0000 | OROMUCOSAL | Status: DC | PRN

## 2018-02-06 MED ORDER — PROPOFOL 10 MG/ML IV BOLUS
INTRAVENOUS | Status: AC
  Filled 2018-02-06: qty 40

## 2018-02-06 MED ORDER — SCOPOLAMINE 1 MG/3DAYS TD PT72
MEDICATED_PATCH | TRANSDERMAL | Status: AC
  Filled 2018-02-06: qty 1

## 2018-02-06 MED ORDER — OXYCODONE HCL 5 MG PO TABS
5.0000 mg | ORAL_TABLET | Freq: Once | ORAL | Status: AC | PRN
  Administered 2018-02-06: 5 mg via ORAL

## 2018-02-06 MED ORDER — HEPARIN SODIUM (PORCINE) 1000 UNIT/ML IJ SOLN
INTRAMUSCULAR | Status: DC | PRN
  Administered 2018-02-06: 5000 [IU] via INTRAVENOUS

## 2018-02-06 MED ORDER — LACTATED RINGERS IV SOLN
INTRAVENOUS | Status: DC | PRN
  Administered 2018-02-06 (×2): via INTRAVENOUS

## 2018-02-06 MED ORDER — SODIUM CHLORIDE 0.9% FLUSH
3.0000 mL | Freq: Two times a day (BID) | INTRAVENOUS | Status: DC
  Administered 2018-02-06: 3 mL via INTRAVENOUS

## 2018-02-06 MED ORDER — PROPOFOL 10 MG/ML IV BOLUS
INTRAVENOUS | Status: DC | PRN
  Administered 2018-02-06: 120 mg via INTRAVENOUS

## 2018-02-06 MED ORDER — THROMBIN 20000 UNITS EX SOLR
CUTANEOUS | Status: DC | PRN
  Administered 2018-02-06: 20 mL via TOPICAL

## 2018-02-06 MED ORDER — FENTANYL CITRATE (PF) 250 MCG/5ML IJ SOLN
INTRAMUSCULAR | Status: AC
  Filled 2018-02-06: qty 5

## 2018-02-06 MED ORDER — PHENYLEPHRINE 40 MCG/ML (10ML) SYRINGE FOR IV PUSH (FOR BLOOD PRESSURE SUPPORT)
PREFILLED_SYRINGE | INTRAVENOUS | Status: AC
  Filled 2018-02-06: qty 10

## 2018-02-06 MED ORDER — DEXAMETHASONE 4 MG PO TABS
4.0000 mg | ORAL_TABLET | Freq: Four times a day (QID) | ORAL | Status: DC
  Administered 2018-02-06 – 2018-02-07 (×3): 4 mg via ORAL
  Filled 2018-02-06 (×3): qty 1

## 2018-02-06 MED ORDER — DEXAMETHASONE SODIUM PHOSPHATE 10 MG/ML IJ SOLN
INTRAMUSCULAR | Status: AC
  Filled 2018-02-06: qty 1

## 2018-02-06 MED ORDER — VANCOMYCIN HCL 1000 MG IV SOLR
INTRAVENOUS | Status: DC | PRN
  Administered 2018-02-06: 1000 mg via TOPICAL

## 2018-02-06 MED ORDER — METHOCARBAMOL 500 MG PO TABS
500.0000 mg | ORAL_TABLET | Freq: Four times a day (QID) | ORAL | Status: DC | PRN
  Administered 2018-02-06 – 2018-02-07 (×3): 500 mg via ORAL
  Filled 2018-02-06 (×2): qty 1

## 2018-02-06 MED ORDER — THROMBIN 20000 UNITS EX SOLR
CUTANEOUS | Status: AC
  Filled 2018-02-06: qty 20000

## 2018-02-06 MED ORDER — HEPARIN SODIUM (PORCINE) 1000 UNIT/ML IJ SOLN
INTRAMUSCULAR | Status: AC
  Filled 2018-02-06: qty 1

## 2018-02-06 MED ORDER — PROPOFOL 1000 MG/100ML IV EMUL
INTRAVENOUS | Status: AC
  Filled 2018-02-06: qty 300

## 2018-02-06 MED ORDER — POTASSIUM CHLORIDE IN NACL 20-0.9 MEQ/L-% IV SOLN: INTRAVENOUS | Status: DC

## 2018-02-06 MED ORDER — PROPOFOL 500 MG/50ML IV EMUL
INTRAVENOUS | Status: DC | PRN
  Administered 2018-02-06: 10:00:00 via INTRAVENOUS
  Administered 2018-02-06: 50 ug/kg/min via INTRAVENOUS

## 2018-02-06 MED ORDER — OXYCODONE HCL 5 MG PO TABS
ORAL_TABLET | ORAL | Status: AC
  Filled 2018-02-06: qty 1

## 2018-02-06 MED ORDER — ACETAMINOPHEN 325 MG PO TABS
650.0000 mg | ORAL_TABLET | ORAL | Status: DC | PRN
  Administered 2018-02-06: 650 mg via ORAL
  Filled 2018-02-06: qty 2

## 2018-02-06 MED ORDER — 0.9 % SODIUM CHLORIDE (POUR BTL) OPTIME
TOPICAL | Status: DC | PRN
  Administered 2018-02-06: 1000 mL

## 2018-02-06 MED ORDER — SODIUM CHLORIDE 0.9 % IV SOLN: 250.0000 mL | INTRAVENOUS | Status: DC

## 2018-02-06 MED ORDER — BUPIVACAINE HCL (PF) 0.25 % IJ SOLN
INTRAMUSCULAR | Status: DC | PRN
  Administered 2018-02-06: 6 mL

## 2018-02-06 MED ORDER — PROPOFOL 1000 MG/100ML IV EMUL
INTRAVENOUS | Status: AC
  Filled 2018-02-06: qty 100

## 2018-02-06 MED ORDER — CELECOXIB 200 MG PO CAPS
200.0000 mg | ORAL_CAPSULE | Freq: Two times a day (BID) | ORAL | Status: DC
  Administered 2018-02-06 (×2): 200 mg via ORAL
  Filled 2018-02-06 (×2): qty 1

## 2018-02-06 MED ORDER — FENTANYL CITRATE (PF) 100 MCG/2ML IJ SOLN
INTRAMUSCULAR | Status: AC
  Filled 2018-02-06: qty 2

## 2018-02-06 MED ORDER — BUPIVACAINE HCL (PF) 0.25 % IJ SOLN
INTRAMUSCULAR | Status: AC
  Filled 2018-02-06: qty 30

## 2018-02-06 MED ORDER — FENTANYL CITRATE (PF) 100 MCG/2ML IJ SOLN
INTRAMUSCULAR | Status: DC | PRN
  Administered 2018-02-06 (×2): 50 ug via INTRAVENOUS
  Administered 2018-02-06: 100 ug via INTRAVENOUS
  Administered 2018-02-06 (×4): 50 ug via INTRAVENOUS

## 2018-02-06 MED ORDER — CEFAZOLIN SODIUM-DEXTROSE 2-4 GM/100ML-% IV SOLN
INTRAVENOUS | Status: AC
  Filled 2018-02-06: qty 100

## 2018-02-06 MED ORDER — EPHEDRINE 5 MG/ML INJ
INTRAVENOUS | Status: AC
  Filled 2018-02-06: qty 10

## 2018-02-06 MED ORDER — LIDOCAINE 2% (20 MG/ML) 5 ML SYRINGE
INTRAMUSCULAR | Status: AC
  Filled 2018-02-06: qty 5

## 2018-02-06 MED ORDER — ROCURONIUM BROMIDE 10 MG/ML (PF) SYRINGE
PREFILLED_SYRINGE | INTRAVENOUS | Status: DC | PRN
  Administered 2018-02-06: 20 mg via INTRAVENOUS
  Administered 2018-02-06: 30 mg via INTRAVENOUS
  Administered 2018-02-06: 20 mg via INTRAVENOUS
  Administered 2018-02-06: 50 mg via INTRAVENOUS
  Administered 2018-02-06: 30 mg via INTRAVENOUS

## 2018-02-06 MED ORDER — FENTANYL CITRATE (PF) 100 MCG/2ML IJ SOLN
25.0000 ug | INTRAMUSCULAR | Status: DC | PRN
  Administered 2018-02-06 (×3): 50 ug via INTRAVENOUS

## 2018-02-06 MED ORDER — SCOPOLAMINE 1 MG/3DAYS TD PT72
MEDICATED_PATCH | TRANSDERMAL | Status: DC | PRN
  Administered 2018-02-06: 1 via TRANSDERMAL

## 2018-02-06 MED ORDER — MENTHOL 3 MG MT LOZG: 1.0000 | LOZENGE | OROMUCOSAL | Status: DC | PRN

## 2018-02-06 MED ORDER — ESCITALOPRAM OXALATE 20 MG PO TABS
20.0000 mg | ORAL_TABLET | Freq: Every day | ORAL | Status: DC
  Administered 2018-02-06: 20 mg via ORAL
  Filled 2018-02-06 (×2): qty 1

## 2018-02-06 MED ORDER — SUCCINYLCHOLINE CHLORIDE 200 MG/10ML IV SOSY
PREFILLED_SYRINGE | INTRAVENOUS | Status: AC
  Filled 2018-02-06: qty 10

## 2018-02-06 MED ORDER — CEFAZOLIN SODIUM-DEXTROSE 2-4 GM/100ML-% IV SOLN
2.0000 g | INTRAVENOUS | Status: AC
  Administered 2018-02-06: 2 g via INTRAVENOUS

## 2018-02-06 MED ORDER — LOSARTAN POTASSIUM 50 MG PO TABS
50.0000 mg | ORAL_TABLET | Freq: Every day | ORAL | Status: DC
  Administered 2018-02-06: 50 mg via ORAL
  Filled 2018-02-06: qty 1

## 2018-02-06 MED ORDER — OXYCODONE HCL 5 MG PO TABS
5.0000 mg | ORAL_TABLET | ORAL | Status: DC | PRN
  Administered 2018-02-06 – 2018-02-07 (×5): 5 mg via ORAL
  Filled 2018-02-06 (×5): qty 1

## 2018-02-06 MED ORDER — OXYCODONE HCL 5 MG/5ML PO SOLN: 5.0000 mg | Freq: Once | ORAL | Status: AC | PRN

## 2018-02-06 MED ORDER — DEXAMETHASONE SODIUM PHOSPHATE 4 MG/ML IJ SOLN: 4.0000 mg | Freq: Four times a day (QID) | INTRAMUSCULAR | Status: DC

## 2018-02-06 MED ORDER — DEXAMETHASONE SODIUM PHOSPHATE 10 MG/ML IJ SOLN
10.0000 mg | INTRAMUSCULAR | Status: AC
  Administered 2018-02-06: 10 mg via INTRAVENOUS

## 2018-02-06 MED ORDER — ONDANSETRON HCL 4 MG/2ML IJ SOLN
INTRAMUSCULAR | Status: DC | PRN
  Administered 2018-02-06: 4 mg via INTRAVENOUS

## 2018-02-06 MED ORDER — LIDOCAINE 20MG/ML (2%) 15 ML SYRINGE OPTIME
INTRAMUSCULAR | Status: DC | PRN
  Administered 2018-02-06: 60 mg via INTRAVENOUS

## 2018-02-06 MED ORDER — CHLORHEXIDINE GLUCONATE CLOTH 2 % EX PADS: 6.0000 | MEDICATED_PAD | Freq: Once | CUTANEOUS | Status: DC

## 2018-02-06 MED ORDER — ONDANSETRON HCL 4 MG/2ML IJ SOLN: 4.0000 mg | Freq: Four times a day (QID) | INTRAMUSCULAR | Status: DC | PRN

## 2018-02-06 MED ORDER — THROMBIN 5000 UNITS EX SOLR
OROMUCOSAL | Status: DC | PRN
  Administered 2018-02-06: 5 mL via TOPICAL

## 2018-02-06 MED ORDER — VANCOMYCIN HCL 1000 MG IV SOLR
INTRAVENOUS | Status: AC
  Filled 2018-02-06: qty 1000

## 2018-02-06 MED FILL — Anticoagulant Citrate Dextrose Solution A: Qty: 10 | Status: AC

## 2018-02-06 SURGICAL SUPPLY — 69 items
ADH SKN CLS APL DERMABOND .7 (GAUZE/BANDAGES/DRESSINGS) ×1
APL SKNCLS STERI-STRIP NONHPOA (GAUZE/BANDAGES/DRESSINGS) ×1
BAG DECANTER FOR FLEXI CONT (MISCELLANEOUS) ×2 IMPLANT
BASKET BONE COLLECTION (BASKET) ×2 IMPLANT
BENZOIN TINCTURE PRP APPL 2/3 (GAUZE/BANDAGES/DRESSINGS) ×2 IMPLANT
BLADE CLIPPER SURG (BLADE) IMPLANT
BUR MATCHSTICK NEURO 3.0 LAGG (BURR) ×2 IMPLANT
CANISTER SUCT 3000ML PPV (MISCELLANEOUS) ×2 IMPLANT
CARTRIDGE OIL MAESTRO DRILL (MISCELLANEOUS) ×1 IMPLANT
CONT SPEC 4OZ CLIKSEAL STRL BL (MISCELLANEOUS) ×2 IMPLANT
COVER BACK TABLE 60X90IN (DRAPES) ×2 IMPLANT
COVER WAND RF STERILE (DRAPES) ×2 IMPLANT
DERMABOND ADVANCED (GAUZE/BANDAGES/DRESSINGS) ×1
DERMABOND ADVANCED .7 DNX12 (GAUZE/BANDAGES/DRESSINGS) ×1 IMPLANT
DIFFUSER DRILL AIR PNEUMATIC (MISCELLANEOUS) ×2 IMPLANT
DRAPE C-ARM 42X72 X-RAY (DRAPES) ×3 IMPLANT
DRAPE LAPAROTOMY 100X72X124 (DRAPES) ×2 IMPLANT
DRAPE POUCH INSTRU U-SHP 10X18 (DRAPES) ×1 IMPLANT
DRAPE SURG 17X23 STRL (DRAPES) ×2 IMPLANT
DRSG OPSITE POSTOP 4X6 (GAUZE/BANDAGES/DRESSINGS) ×1 IMPLANT
DURAPREP 26ML APPLICATOR (WOUND CARE) ×2 IMPLANT
ELECT REM PT RETURN 9FT ADLT (ELECTROSURGICAL) ×2
ELECTRODE REM PT RTRN 9FT ADLT (ELECTROSURGICAL) ×1 IMPLANT
EVACUATOR 1/8 PVC DRAIN (DRAIN) ×1 IMPLANT
GAUZE 4X4 16PLY RFD (DISPOSABLE) IMPLANT
GLOVE BIO SURGEON STRL SZ7 (GLOVE) ×2 IMPLANT
GLOVE BIO SURGEON STRL SZ8 (GLOVE) ×4 IMPLANT
GLOVE BIOGEL PI IND STRL 6.5 (GLOVE) IMPLANT
GLOVE BIOGEL PI IND STRL 7.0 (GLOVE) IMPLANT
GLOVE BIOGEL PI INDICATOR 6.5 (GLOVE) ×1
GLOVE BIOGEL PI INDICATOR 7.0 (GLOVE) ×1
GLOVE SURG SS PI 6.0 STRL IVOR (GLOVE) ×2 IMPLANT
GLOVE SURG SS PI 7.5 STRL IVOR (GLOVE) ×6 IMPLANT
GOWN STRL REUS W/ TWL LRG LVL3 (GOWN DISPOSABLE) IMPLANT
GOWN STRL REUS W/ TWL XL LVL3 (GOWN DISPOSABLE) ×2 IMPLANT
GOWN STRL REUS W/TWL 2XL LVL3 (GOWN DISPOSABLE) IMPLANT
GOWN STRL REUS W/TWL LRG LVL3 (GOWN DISPOSABLE) ×8
GOWN STRL REUS W/TWL XL LVL3 (GOWN DISPOSABLE) ×4
HEMOSTAT POWDER KIT SURGIFOAM (HEMOSTASIS) ×1 IMPLANT
KIT BASIN OR (CUSTOM PROCEDURE TRAY) ×2 IMPLANT
KIT BONE MRW ASP ANGEL CPRP (KITS) ×1 IMPLANT
KIT TURNOVER KIT B (KITS) ×2 IMPLANT
MILL MEDIUM DISP (BLADE) ×1 IMPLANT
NDL HYPO 21X1.5 SAFETY (NEEDLE) IMPLANT
NDL HYPO 25X1 1.5 SAFETY (NEEDLE) ×1 IMPLANT
NEEDLE HYPO 21X1.5 SAFETY (NEEDLE) ×4 IMPLANT
NEEDLE HYPO 25X1 1.5 SAFETY (NEEDLE) ×2 IMPLANT
NS IRRIG 1000ML POUR BTL (IV SOLUTION) ×2 IMPLANT
OIL CARTRIDGE MAESTRO DRILL (MISCELLANEOUS) ×2
PACK LAMINECTOMY NEURO (CUSTOM PROCEDURE TRAY) ×2 IMPLANT
PAD ARMBOARD 7.5X6 YLW CONV (MISCELLANEOUS) ×8 IMPLANT
PUTTY DBM ALLOSYNC PURE 10CC (Putty) ×1 IMPLANT
ROD LORD TI 5.5X35 (Rod) ×2 IMPLANT
SCREW ILIAC PA 5.5X40 (Screw) ×4 IMPLANT
SET SCREW (Screw) ×8 IMPLANT
SET SCREW SPNE (Screw) IMPLANT
SPACER IDENTITI PS 9X9X25 10D (Spacer) ×2 IMPLANT
SPONGE LAP 4X18 RFD (DISPOSABLE) IMPLANT
SPONGE SURGIFOAM ABS GEL 100 (HEMOSTASIS) ×2 IMPLANT
STRIP CLOSURE SKIN 1/2X4 (GAUZE/BANDAGES/DRESSINGS) ×4 IMPLANT
SUT VIC AB 0 CT1 18XCR BRD8 (SUTURE) ×1 IMPLANT
SUT VIC AB 0 CT1 8-18 (SUTURE) ×2
SUT VIC AB 2-0 CP2 18 (SUTURE) ×2 IMPLANT
SUT VIC AB 3-0 SH 8-18 (SUTURE) ×4 IMPLANT
SYRINGE 20CC LL (MISCELLANEOUS) ×2 IMPLANT
TOWEL GREEN STERILE (TOWEL DISPOSABLE) ×2 IMPLANT
TOWEL GREEN STERILE FF (TOWEL DISPOSABLE) ×2 IMPLANT
TRAY FOLEY MTR SLVR 16FR STAT (SET/KITS/TRAYS/PACK) ×2 IMPLANT
WATER STERILE IRR 1000ML POUR (IV SOLUTION) ×2 IMPLANT

## 2018-02-06 NOTE — Op Note (Signed)
02/06/2018  11:30 AM  PATIENT:  Bonnie Juarez  72 y.o. female  PRE-OPERATIVE DIAGNOSIS: Degenerative spondylolisthesis L4-5 with spinal stenosis, back and leg pain  POST-OPERATIVE DIAGNOSIS:  same  PROCEDURE:   1. Decompressive lumbar laminectomy L4-5 requiring more work than would be required for a simple exposure of the disk for PLIF in order to adequately decompress the neural elements and address the spinal stenosis 2. Posterior lumbar interbody fusion L4-5 using porous titanium interbody cages packed with morcellized allograft and autograft soaked with a bone marrow aspirate obtained through a separate fascial incision over the right iliac crest 3. Posterior fixation L4-5 using Alphatec cortical pedicle screws.  4. Intertransverse arthrodesis L4-5 using morcellized autograft and allograft.  SURGEON:  Sherley Bounds, MD  ASSISTANTS: Glenford Peers FNP  ANESTHESIA:  General  EBL: 500 ml  Total I/O In: 1400 [I.V.:1400] Out: 122 [Urine:385; Blood:500]  BLOOD ADMINISTERED:none  DRAINS: none   INDICATION FOR PROCEDURE: This patient presented with back and bilateral leg pain left greater than right. Imaging revealed degenerative spondylolisthesis with spinal stenosis L4-5. The patient tried a reasonable attempt at conservative medical measures without relief. I recommended decompression and instrumented fusion to address the stenosis as well as the segmental  instability.  Patient understood the risks, benefits, and alternatives and potential outcomes and wished to proceed.  PROCEDURE DETAILS:  The patient was brought to the operating room. After induction of generalized endotracheal anesthesia the patient was rolled into the prone position on chest rolls and all pressure points were padded. [name] lumbar region was cleaned and then prepped with DuraPrep and draped in the usual sterile fashion. Anesthesia was injected and then a dorsal midline incision was made and carried down  to the lumbosacral fascia. The fascia was opened and the paraspinous musculature was taken down in a subperiosteal fashion to expose L4-5. A self-retaining retractor was placed. Intraoperative fluoroscopy confirmed my level, and I started with placement of the L4 cortical pedicle screws. The pedicle screw entry zones were identified utilizing surface landmarks and  AP and lateral fluoroscopy. I scored the cortex with the high-speed drill and then used the hand drill to drill an upward and outward direction into the pedicle. I then tapped line to line. I then placed a 5.5 x 40 mm cortical pedicle screw into the pedicles of L4 bilaterally.  I then dissected in a suprafascial plane to expose the iliac crest.  Open the fascia used a Jamshidi needle to extract 60 cc of bone marrow aspirate from the iliac crest.  This was then spun down by [facility] device and 2 to 4 cc of  BMAC was soaked on morselized allograft for later arthrodesis.  I dried the hole with Surgifoam and closed the fascia.  I then turned my attention to the decompression and complete lumbar laminectomies, hemi- facetectomies, and foraminotomies were performed at L4-5. The patient had significant spinal stenosis and this required more work than would be required for a simple exposure of the disc for posterior lumbar interbody fusion which would only require a limited laminotomy. Much more generous decompression and generous foraminotomy was undertaken in order to adequately decompress the neural elements and address the patient's leg pain. The yellow ligament was removed to expose the underlying dura and nerve roots, and generous foraminotomies were performed to adequately decompress the neural elements. Both the exiting and traversing nerve roots were decompressed on both sides until a coronary dilator passed easily along the nerve roots. Once the decompression was complete,  I turned my attention to the posterior lower lumbar interbody fusion. The epidural  venous vasculature was coagulated and cut sharply. Disc space was incised and the initial discectomy was performed with pituitary rongeurs. The disc space was distracted with sequential distractors to a height of 10 mm. We then used a series of scrapers and shavers to prepare the endplates for fusion. The midline was prepared with Epstein curettes. Once the complete discectomy was finished, we packed an appropriate sized interbody cage with local autograft and morcellized allograft, gently retracted the nerve root, and tapped the cage into position at L4-5.  The midline between the cages was packed with morselized autograft and allograft. We then turned our attention to the placement of the lower pedicle screws. The pedicle screw entry zones were identified utilizing surface landmarks and fluoroscopy. I drilled into each pedicle utilizing the hand drill, and tapped each pedicle with the appropriate tap. We palpated with a ball probe to assure no break in the cortex. We then placed 5.5 x 40 mm cortical pedicle screws into the pedicles bilaterally at L5. We then decorticated the transverse processes and laid a mixture of morcellized autograft and allograft out over these to perform intertransverse arthrodesis at L4-5. We then placed lordotic rods into the multiaxial screw heads of the pedicle screws and locked these in position with the locking caps and anti-torque device. We then checked our construct with AP and lateral fluoroscopy. Irrigated with copious amounts of bacitracin-containing saline solution. Inspected the nerve roots once again to assure adequate decompression, lined to the dura with Gelfoam, placed powdered vancomycin into the wound, and closed the muscle and the fascia with 0 Vicryl. Closed the subcutaneous tissues with 2-0 Vicryl and subcuticular tissues with 3-0 Vicryl. The skin was closed with benzoin and Steri-Strips. Dressing was then applied, the patient was awakened from general anesthesia and  transported to the recovery room in stable condition. At the end of the procedure all sponge, needle and instrument counts were correct.   PLAN OF CARE: admit to inpatient  PATIENT DISPOSITION:  PACU - hemodynamically stable.   Delay start of Pharmacological VTE agent (>24hrs) due to surgical blood loss or risk of bleeding:  yes

## 2018-02-06 NOTE — Transfer of Care (Signed)
Immediate Anesthesia Transfer of Care Note  Patient: Bonnie Juarez  Procedure(s) Performed: Posterior Lumbar Interbody Fusion Lumbar Four-Five (N/A Spine Lumbar)  Patient Location: PACU  Anesthesia Type:General  Level of Consciousness: awake, alert , oriented and patient cooperative  Airway & Oxygen Therapy: Patient Spontanous Breathing and Patient connected to nasal cannula oxygen  Post-op Assessment: Report given to RN and Post -op Vital signs reviewed and stable, MAE x4  Post vital signs: Reviewed and stable  Last Vitals:  Vitals Value Taken Time  BP 157/95 02/06/2018 11:50 AM  Temp    Pulse 65 02/06/2018 11:53 AM  Resp 19 02/06/2018 11:53 AM  SpO2 99 % 02/06/2018 11:53 AM  Vitals shown include unvalidated device data.  Last Pain:  Vitals:   02/06/18 0708  TempSrc: Oral  PainSc:          Complications: No apparent anesthesia complications

## 2018-02-06 NOTE — Evaluation (Signed)
Physical Therapy Evaluation Patient Details Name: Bonnie Juarez MRN: [id number] DOB: Feb 03, 1946 Today's Date: 02/06/2018   History of Present Illness  72 y.o. female admitted for PLIF. Patient with PMH that includes HTN, anxiety, depression, hiatal hernia, GERD, Nissen fundoplication, IBS, fibromyalgia, skin cancer (SCC), OSA (reported f/u test did not show OSA), cervical fusion (3 level) '83, former smoker (quit '82), ORIF right humerus fracture 01/08/13, C3-T2 posterior fusion 12/20/16.   Clinical Impression  Patient seen for mobility assessment s/p spinal surgery. Mobilizing well. Educated patient on precautions, mobility expectations, safety and car transfers. No further acute PT needs. Will sign off.    Follow Up Recommendations No PT follow up    Equipment Recommendations  None recommended by PT    Recommendations for Other Services       Precautions / Restrictions Precautions Precautions: Back Precaution Booklet Issued: Yes (comment) Precaution Comments: verbally reviewed with patient Required Braces or Orthoses: Spinal Brace Spinal Brace: Lumbar corset Restrictions Weight Bearing Restrictions: No      Mobility  Bed Mobility Overal bed mobility: Modified Independent             General bed mobility comments: educated initially then able to perform without difficulty  Transfers Overall transfer level: Needs assistance Equipment used: None Transfers: Sit to/from Stand Sit to Stand: Supervision         General transfer comment: supervision for safety, Vcs for positioning  Ambulation/Gait Ambulation/Gait assistance: Min guard Gait Distance (Feet): 380 Feet Assistive device: 1 person hand held assist(for comfort, not assist) Gait Pattern/deviations: Step-through pattern;Decreased stride length;Drifts right/left Gait velocity: decreased Gait velocity interpretation: <1.8 ft/sec, indicate of risk for recurrent falls General Gait Details: patient with good  activity tolerance, modest instability ocassional HHA for comfort  Stairs Stairs: Yes Stairs assistance: Supervision Stair Management: One rail Right Number of Stairs: 7 General stair comments: VCs for sequencing  Wheelchair Mobility    Modified Rankin (Stroke Patients Only)       Balance Overall balance assessment: Mild deficits observed, not formally tested                                           Pertinent Vitals/Pain Pain Assessment: Faces Faces Pain Scale: Hurts even more Pain Location: back surgical site Pain Descriptors / Indicators: Operative site guarding;Sore Pain Intervention(s): Monitored during session    Home Living Family/patient expects to be discharged to:: Private residence Living Arrangements: Spouse/significant other Available Help at Discharge: Family Type of Home: House Home Access: Stairs to enter   Technical brewer of Steps: 1 Home Layout: Two level Home Equipment: Environmental consultant - 4 wheels;Cane - single point;Shower seat - built in      Prior Function Level of Independence: Independent               Hand Dominance   Dominant Hand: Right    Extremity/Trunk Assessment   Upper Extremity Assessment Upper Extremity Assessment: Overall WFL for tasks assessed    Lower Extremity Assessment Lower Extremity Assessment: Overall WFL for tasks assessed    Cervical / Trunk Assessment Cervical / Trunk Assessment: (s/p spinal surgery)  Communication   Communication: No difficulties  Cognition Arousal/Alertness: Awake/alert Behavior During Therapy: WFL for tasks assessed/performed Overall Cognitive Status: Within Functional Limits for tasks assessed  General Comments      Exercises     Assessment/Plan    PT Assessment Patent does not need any further PT services  PT Problem List         PT Treatment Interventions      PT Goals (Current goals can be  found in the Care Plan section)  Acute Rehab PT Goals PT Goal Formulation: All assessment and education complete, DC therapy    Frequency     Barriers to discharge        Co-evaluation               AM-PAC PT "6 Clicks" Mobility  Outcome Measure Help needed turning from your back to your side while in a flat bed without using bedrails?: None Help needed moving from lying on your back to sitting on the side of a flat bed without using bedrails?: None Help needed moving to and from a bed to a chair (including a wheelchair)?: A Little Help needed standing up from a chair using your arms (e.g., wheelchair or bedside chair)?: A Little Help needed to walk in hospital room?: A Little Help needed climbing 3-5 steps with a railing? : A Little 6 Click Score: 20    End of Session Equipment Utilized During Treatment: Back brace Activity Tolerance: Patient tolerated treatment well Patient left: in bed;with call bell/phone within reach;with SCD's reapplied Nurse Communication: Mobility status PT Visit Diagnosis: Difficulty in walking, not elsewhere classified (R26.2)    Time: 5364-6803 PT Time Calculation (min) (ACUTE ONLY): 17 min   Charges:   PT Evaluation $PT Eval Low Complexity: Shelly, PT DPT  Board Certified Neurologic Specialist Acute Rehabilitation Services Pager [phone] Office [phone]   Duncan Dull 02/06/2018, 4:21 PM

## 2018-02-06 NOTE — Anesthesia Preprocedure Evaluation (Signed)
Anesthesia Evaluation  Patient identified by MRN, date of birth, ID band Patient awake    Reviewed: Allergy & Precautions, NPO status , Patient's Chart, lab work & pertinent test results  History of Anesthesia Complications (+) PONV and history of anesthetic complications  Airway Mallampati: II   Neck ROM: full    Dental   Pulmonary sleep apnea , former smoker,    breath sounds clear to auscultation       Cardiovascular hypertension,  Rhythm:regular Rate:Normal     Neuro/Psych  Headaches, PSYCHIATRIC DISORDERS Anxiety Depression  Neuromuscular disease    GI/Hepatic hiatal hernia, GERD  ,  Endo/Other    Renal/GU      Musculoskeletal  (+) Arthritis , Fibromyalgia -  Abdominal   Peds  Hematology   Anesthesia Other Findings   Reproductive/Obstetrics                             Anesthesia Physical Anesthesia Plan  ASA: II  Anesthesia Plan: General   Post-op Pain Management:    Induction: Intravenous  PONV Risk Score and Plan: 4 or greater and Ondansetron, Dexamethasone, Midazolam and Treatment may vary due to age or medical condition  Airway Management Planned: Oral ETT  Additional Equipment:   Intra-op Plan:   Post-operative Plan: Extubation in OR  Informed Consent: I have reviewed the patients History and Physical, chart, labs and discussed the procedure including the risks, benefits and alternatives for the proposed anesthesia with the patient or authorized representative who has indicated his/her understanding and acceptance.   Dental advisory given  Plan Discussed with: CRNA, Anesthesiologist and Surgeon  Anesthesia Plan Comments:         Anesthesia Quick Evaluation

## 2018-02-06 NOTE — H&P (Signed)
Subjective: Patient is a 72 y.o. female admitted for PLIF. Onset of symptoms was several months ago, gradually worsening since that time.  The pain is rated severe, and is located at the across the lower back and radiates to legs. The pain is described as aching and occurs all day. The symptoms have been progressive. Symptoms are exacerbated by exercise. MRI or CT showed spondylolisthesis with stenosis l4-5   Past Medical History:  Diagnosis Date  . Anxiety   . Arthritis   . Cancer ([place])    Hx: of squamouscell carcinoma on Left shin basal cell on face; melanoma on right lower leg  . Complication of anesthesia    12/21/2003 deceased LOC and hypoxia overmedicating with Morphine and Phenergan; received Narcan X 2, overnight pulse ox  . Depression   . Fibromyalgia   . Frequent urination   . GERD (gastroesophageal reflux disease)    Hx: of  . H/O hiatal hernia    PMH: of  . Headache(784.0)   . Hypertension   . IBS (irritable bowel syndrome)    Hx: of  . Osteopenia    Hx: of per pt, pt. adds osteoporosis- DOS  . Pneumonia    hx  . PONV (postoperative nausea and vomiting)   . Sleep apnea    Hx: of in the past-?mild apnea 10 yrs ago- followed by dr and none found    Past Surgical History:  Procedure Laterality Date  . APPENDECTOMY    . ARCUATE KERATECTOMY    . BREAST LUMPECTOMY Right 1991  . CERVICAL FUSION  1983  . FRACTURE SURGERY Right 2014   humerus  . HERNIA REPAIR     hiatal  . KNEE ARTHROSCOPY Right 2015  . [place] PLACATION  2010  . ORIF HUMERUS FRACTURE Right 01/08/2013   Procedure: OPEN REDUCTION INTERNAL FIXATION (ORIF) PROXIMAL HUMERUS FRACTURE;  Surgeon: Marybelle Killings, MD;  Location: [facility];  Service: Orthopedics;  Laterality: Right;  Open Reduction Internal Fixation Right Proximal Humerus Fracture  . POSTERIOR CERVICAL FUSION/FORAMINOTOMY N/A 12/20/2016   Procedure: Posterior Cervical Fusion with lateral mass fixation - Cervical Three-Thoracic Two;  Surgeon: Eustace Moore, MD;  Location: [facility];  Service: Neurosurgery;  Laterality: N/A;  . RADIAL HEAD REPLACEMENT  2005  . trigger thrumb release Left 2001  . tubular plasity  1970    Prior to Admission medications   Medication Sig Start Date End Date Taking? Authorizing Provider  Cholecalciferol (VITAMIN D) 50 MCG (2000 UT) tablet Take 2,000 Units by mouth every morning.    Yes [provider]  Coenzyme Q10 (COQ10) 100 MG CAPS Take 100 mg by mouth 2 (two) times a week.    Yes [provider]  conjugated estrogens (PREMARIN) vaginal cream Place 1 g vaginally once a week.    Yes [provider]  escitalopram (LEXAPRO) 20 MG tablet Take 20 mg by mouth daily.  10/08/17  Yes [provider]  Evolocumab (REPATHA) 140 MG/ML SOSY Inject 140 mg into the skin every 14 (fourteen) days.   Yes [provider]  fluticasone (FLONASE) 50 MCG/ACT nasal spray Place 2 sprays into the nose daily as needed for allergies or rhinitis.    Yes [provider]  losartan (COZAAR) 50 MG tablet Take 50 mg by mouth daily.   Yes [provider]  traZODone (DESYREL) 100 MG tablet Take 100 mg by mouth at bedtime.  10/22/17  Yes [provider]  vitamin B-12 (CYANOCOBALAMIN) 1000 MCG tablet  Take 2,000 mcg by mouth every morning.   Yes [provider]  methocarbamol (ROBAXIN) 500 MG tablet Take 1 tablet (500 mg total) by mouth every 6 (six) hours as needed for muscle spasms. Patient not taking: Reported on 01/22/2018 12/22/16   Eustace Moore, MD  OVER THE COUNTER MEDICATION Take 1 capsule by mouth daily as needed (pain). CBD OIL Caps 25 mg per capsule     [provider]   Allergies  Allergen Reactions  . Boniva [Ibandronic Acid] Other (See Comments)    Bone Pain  . Dilaudid [Hydromorphone Hcl] Other (See Comments)    Hallucinations  . Risedronate Sodium Other (See Comments)    Bone pain    Social History   Tobacco Use  . Smoking status: Former  Smoker    Packs/day: 1.00    Years: 15.00    Pack years: 15.00    Types: Cigarettes    Last attempt to quit: 12/12/1980    Years since quitting: 37.1  . Smokeless tobacco: Never Used  . Tobacco comment: Quit 1982"  Substance Use Topics  . Alcohol use: Yes    Alcohol/week: 7.0 - 10.0 standard drinks    Types: 7 - 10 Glasses of wine per week    Family History  Problem Relation Age of Onset  . Heart disease Other   . Lung disease Other      Review of Systems  Positive ROS: neg  All other systems have been reviewed and were otherwise negative with the exception of those mentioned in the HPI and as above.  Objective: Vital signs in last 24 hours: Temp:  [98.3 F (36.8 C)] 98.3 F (36.8 C) (12/11 0708) Pulse Rate:  [58] 58 (12/11 0708) Resp:  [16] 16 (12/11 0708) BP: (139)/(65) 139/65 (12/11 0708) SpO2:  [97 %] 97 % (12/11 0708) Weight:  [80 kg] 80 kg (12/11 0708)  General Appearance: Alert, cooperative, no distress, appears stated age Head: Normocephalic, without obvious abnormality, atraumatic Eyes: PERRL, conjunctiva/corneas clear, EOM's intact    Neck: Supple, symmetrical, trachea midline Back: Symmetric, no curvature, ROM normal, no CVA tenderness Lungs:  respirations unlabored Heart: Regular rate and rhythm Abdomen: Soft, non-tender Extremities: Extremities normal, atraumatic, no cyanosis or edema Pulses: 2+ and symmetric all extremities Skin: Skin color, texture, turgor normal, no rashes or lesions  NEUROLOGIC:   Mental status: Alert and oriented x4,  no aphasia, good attention span, fund of knowledge, and memory Motor Exam - grossly normal Sensory Exam - grossly normal Reflexes: 1+ Coordination - grossly normal Gait - grossly normal Balance - grossly normal Cranial Nerves: I: smell Not tested  II: visual acuity  OS: nl    OD: nl  II: visual fields Full to confrontation  II: pupils Equal, round, reactive to light  III,VII: ptosis None  III,IV,VI:  extraocular muscles  Full ROM  V: mastication Normal  V: facial light touch sensation  Normal  V,VII: corneal reflex  Present  VII: facial muscle function - upper  Normal  VII: facial muscle function - lower Normal  VIII: hearing Not tested  IX: soft palate elevation  Normal  IX,X: gag reflex Present  XI: trapezius strength  5/5  XI: sternocleidomastoid strength 5/5  XI: neck flexion strength  5/5  XII: tongue strength  Normal    Data Review Lab Results  Component Value Date   WBC 9.1 01/30/2018   HGB 13.2 01/30/2018   HCT 43.0 01/30/2018   MCV 89.2 01/30/2018   PLT  267 01/30/2018   Lab Results  Component Value Date   NA 141 01/30/2018   K 3.7 01/30/2018   CL 107 01/30/2018   CO2 25 01/30/2018   BUN 10 01/30/2018   CREATININE 1.07 (H) 01/30/2018   GLUCOSE 113 (H) 01/30/2018   Lab Results  Component Value Date   INR 1.01 01/30/2018    Assessment/Plan:  Estimated body mass index is 28.26 kg/m as calculated from the following:   Height as of this encounter: 5' 6.25" (1.683 m).   Weight as of this encounter: 80 kg. Patient admitted for PLIF L4-5. Patient has failed a reasonable attempt at conservative therapy.  I explained the condition and procedure to the patient and answered any questions.  Patient wishes to proceed with procedure as planned. Understands risks/ benefits and typical outcomes of procedure.   Julieta Rogalski S 02/06/2018 8:16 AM

## 2018-02-06 NOTE — Anesthesia Procedure Notes (Signed)
Procedure Name: Intubation Date/Time: 02/06/2018 8:43 AM Performed by: Lowella Dell, CRNA Pre-anesthesia Checklist: Patient identified, Emergency Drugs available, Suction available and Patient being monitored Patient Re-evaluated:Patient Re-evaluated prior to induction Oxygen Delivery Method: Circle System Utilized Preoxygenation: Pre-oxygenation with 100% oxygen Induction Type: IV induction Ventilation: Mask ventilation without difficulty Laryngoscope Size: Glidescope and 3 (*elective* d/t hx cervical fusion) Grade View: Grade I Tube type: Oral Tube size: 7.0 mm Number of attempts: 1 Airway Equipment and Method: Video-laryngoscopy and Rigid stylet Placement Confirmation: ETT inserted through vocal cords under direct vision,  positive ETCO2 and breath sounds checked- equal and bilateral Secured at: 20 cm Tube secured with: Tape Dental Injury: Teeth and Oropharynx as per pre-operative assessment  Comments: Head/neck maintained in neutral position throughout mask ventilation and intubation.

## 2018-02-07 ENCOUNTER — Other Ambulatory Visit: Payer: Self-pay

## 2018-02-07 MED ORDER — HYDROCODONE-ACETAMINOPHEN 5-325 MG PO TABS: 1.0000 | ORAL_TABLET | ORAL | 0 refills | Status: DC | PRN

## 2018-02-07 MED ORDER — METHOCARBAMOL 500 MG PO TABS: 500.0000 mg | ORAL_TABLET | Freq: Four times a day (QID) | ORAL | 0 refills | Status: DC | PRN

## 2018-02-07 NOTE — Progress Notes (Signed)
Patient Bonnie Juarez home via car with spouse.  DC instructions and prescriptions given and both fully understood.  Vital signs and assessments were stable.

## 2018-02-07 NOTE — Evaluation (Signed)
Occupational Therapy Evaluation Patient Details Name: Bonnie Juarez MRN: [id number] DOB: 1945-08-10 Today's Date: 02/07/2018    History of Present Illness 72 y.o. female admitted for PLIF. Patient with PMH that includes HTN, anxiety, depression, hiatal hernia, GERD, Nissen fundoplication, IBS, fibromyalgia, skin cancer (SCC), OSA (reported f/u test did not show OSA), cervical fusion (3 level) '83, former smoker (quit '82), ORIF right humerus fracture 01/08/13, C3-T2 posterior fusion 12/20/16.    Clinical Impression   This 72 y/o female presents with the above. At baseline pt is independent with ADLs and functional mobility. Pt performing functional mobility without AD and overall minguard assist. She currently requires setup assist for UB ADL, minguard assist for LB ADLs. Pt will return home with spouse who was present during session and reports is able to assist with ADLs/iADLs PRN. Reviewed back precautions, brace management, safety and compensatory strategies for performing ADLs and functional transfers while maintaining precautions with pt verbalizing/return demonstrating understanding. Feel pt is safe to return home from OT standpoint given available spouse assist. Questions answered throughout with no further OT needs identified at this time. Acute OT to sign off. Thank you for this referral.      Follow Up Recommendations  No OT follow up;Supervision/Assistance - 24 hour(24hr initially)    Equipment Recommendations  None recommended by OT(pt's DME needs are met)           Precautions / Restrictions Precautions Precautions: Back Precaution Booklet Issued: Yes (comment) Precaution Comments: verbally reviewed with patient; pt able to recall 3/3 precautions Required Braces or Orthoses: Spinal Brace Spinal Brace: Lumbar corset Restrictions Weight Bearing Restrictions: No      Mobility Bed Mobility               General bed mobility comments: sitting EOB upon arrival; pt  reports understanding of log roll technique  Transfers Overall transfer level: Needs assistance Equipment used: None Transfers: Sit to/from Stand Sit to Stand: Supervision         General transfer comment: supervision for safety, Vcs for positioning    Balance Overall balance assessment: Mild deficits observed, not formally tested                                         ADL either performed or assessed with clinical judgement   ADL Overall ADL's : Needs assistance/impaired Eating/Feeding: Modified independent;Sitting   Grooming: Supervision/safety;Standing   Upper Body Bathing: Set up;Sitting   Lower Body Bathing: Min guard;Sit to/from stand   Upper Body Dressing : Set up;Min guard;Sitting;Standing Upper Body Dressing Details (indicate cue type and reason): for brace management; pt adjusting in standing with minguard assist Lower Body Dressing: Min guard;Sit to/from stand Lower Body Dressing Details (indicate cue type and reason): pt easily able to perform figure 4 technique for LB ADL Toilet Transfer: Min guard;Supervision/safety;Ambulation;Comfort height toilet Toilet Transfer Details (indicate cue type and reason): simulated in room level mobility and transfer to/from EOB Toileting- Clothing Manipulation and Hygiene: Min guard;Sit to/from stand       Functional mobility during ADLs: Min guard;Supervision/safety General ADL Comments: reviewed back precautions, brace management, safety and compensatory strategies for performing ADLs and functional transfers while maintaining precautions with pt verbalizing/return demonstrating understanding      Vision         Perception     Praxis      Pertinent Vitals/Pain Pain Assessment: Faces Faces  Occupational Therapy Evaluation Patient Details Name: Bonnie Juarez MRN: [id number] DOB: 1945-08-10 Today's Date: 02/07/2018    History of Present Illness 72 y.o. female admitted for PLIF. Patient with PMH that includes HTN, anxiety, depression, hiatal hernia, GERD, Nissen fundoplication, IBS, fibromyalgia, skin cancer (SCC), OSA (reported f/u test did not show OSA), cervical fusion (3 level) '83, former smoker (quit '82), ORIF right humerus fracture 01/08/13, C3-T2 posterior fusion 12/20/16.    Clinical Impression   This 72 y/o female presents with the above. At baseline pt is independent with ADLs and functional mobility. Pt performing functional mobility without AD and overall minguard assist. She currently requires setup assist for UB ADL, minguard assist for LB ADLs. Pt will return home with spouse who was present during session and reports is able to assist with ADLs/iADLs PRN. Reviewed back precautions, brace management, safety and compensatory strategies for performing ADLs and functional transfers while maintaining precautions with pt verbalizing/return demonstrating understanding. Feel pt is safe to return home from OT standpoint given available spouse assist. Questions answered throughout with no further OT needs identified at this time. Acute OT to sign off. Thank you for this referral.      Follow Up Recommendations  No OT follow up;Supervision/Assistance - 24 hour(24hr initially)    Equipment Recommendations  None recommended by OT(pt's DME needs are met)           Precautions / Restrictions Precautions Precautions: Back Precaution Booklet Issued: Yes (comment) Precaution Comments: verbally reviewed with patient; pt able to recall 3/3 precautions Required Braces or Orthoses: Spinal Brace Spinal Brace: Lumbar corset Restrictions Weight Bearing Restrictions: No      Mobility Bed Mobility               General bed mobility comments: sitting EOB upon arrival; pt  reports understanding of log roll technique  Transfers Overall transfer level: Needs assistance Equipment used: None Transfers: Sit to/from Stand Sit to Stand: Supervision         General transfer comment: supervision for safety, Vcs for positioning    Balance Overall balance assessment: Mild deficits observed, not formally tested                                         ADL either performed or assessed with clinical judgement   ADL Overall ADL's : Needs assistance/impaired Eating/Feeding: Modified independent;Sitting   Grooming: Supervision/safety;Standing   Upper Body Bathing: Set up;Sitting   Lower Body Bathing: Min guard;Sit to/from stand   Upper Body Dressing : Set up;Min guard;Sitting;Standing Upper Body Dressing Details (indicate cue type and reason): for brace management; pt adjusting in standing with minguard assist Lower Body Dressing: Min guard;Sit to/from stand Lower Body Dressing Details (indicate cue type and reason): pt easily able to perform figure 4 technique for LB ADL Toilet Transfer: Min guard;Supervision/safety;Ambulation;Comfort height toilet Toilet Transfer Details (indicate cue type and reason): simulated in room level mobility and transfer to/from EOB Toileting- Clothing Manipulation and Hygiene: Min guard;Sit to/from stand       Functional mobility during ADLs: Min guard;Supervision/safety General ADL Comments: reviewed back precautions, brace management, safety and compensatory strategies for performing ADLs and functional transfers while maintaining precautions with pt verbalizing/return demonstrating understanding      Vision         Perception     Praxis      Pertinent Vitals/Pain Pain Assessment: Faces Faces

## 2018-02-07 NOTE — Discharge Summary (Signed)
Physician Discharge Summary  Patient ID: Bonnie Juarez MRN: [id number] DOB/AGE: Jul 15, 1945 72 y.o.  Admit date: 02/06/2018 Discharge date: 02/07/2018  Admission Diagnoses:  Degenerative spondylolisthesis L4-5 with spinal stenosis, back and leg pain    Discharge Diagnoses: same   Discharged Condition: good  Hospital Course: The patient was admitted on 02/06/2018 and taken to the operating room where the patient underwent plif L4-5. The patient tolerated the procedure well and was taken to the recovery room and then to the floor in stable condition. The hospital course was routine. There were no complications. The wound remained clean dry and intact. Pt had appropriate back soreness. No complaints of leg pain or new N/T/W. The patient remained afebrile with stable vital signs, and tolerated a regular diet. The patient continued to increase activities, and pain was well controlled with oral pain medications.   Consults: None  Significant Diagnostic Studies:  Results for orders placed or performed during the hospital encounter of 01/30/18  Surgical pcr screen  Result Value Ref Range   MRSA, PCR NEGATIVE NEGATIVE   Staphylococcus aureus NEGATIVE NEGATIVE  Basic metabolic panel  Result Value Ref Range   Sodium 141 135 - 145 mmol/L   Potassium 3.7 3.5 - 5.1 mmol/L   Chloride 107 98 - 111 mmol/L   CO2 25 22 - 32 mmol/L   Glucose, Bld 113 (H) 70 - 99 mg/dL   BUN 10 8 - 23 mg/dL   Creatinine, Ser 1.07 (H) 0.44 - 1.00 mg/dL   Calcium 9.2 8.9 - 10.3 mg/dL   GFR calc non Af Amer 51 (L) >60 mL/min   GFR calc Af Amer 59 (L) >60 mL/min   Anion gap 9 5 - 15  CBC WITH DIFFERENTIAL  Result Value Ref Range   WBC 9.1 4.0 - 10.5 K/uL   RBC 4.82 3.87 - 5.11 MIL/uL   Hemoglobin 13.2 12.0 - 15.0 g/dL   HCT 43.0 36.0 - 46.0 %   MCV 89.2 80.0 - 100.0 fL   MCH 27.4 26.0 - 34.0 pg   MCHC 30.7 30.0 - 36.0 g/dL   RDW 13.4 11.5 - 15.5 %   Platelets 267 150 - 400 K/uL   nRBC 0.0 0.0 - 0.2 %   Neutrophils Relative % 49 %   Neutro Abs 4.5 1.7 - 7.7 K/uL   Lymphocytes Relative 40 %   Lymphs Abs 3.6 0.7 - 4.0 K/uL   Monocytes Relative 7 %   Monocytes Absolute 0.6 0.1 - 1.0 K/uL   Eosinophils Relative 3 %   Eosinophils Absolute 0.2 0.0 - 0.5 K/uL   Basophils Relative 1 %   Basophils Absolute 0.1 0.0 - 0.1 K/uL   Immature Granulocytes 0 %   Abs Immature Granulocytes 0.02 0.00 - 0.07 K/uL  Protime-INR  Result Value Ref Range   Prothrombin Time 13.2 11.4 - 15.2 seconds   INR 1.01   Type and screen All Cardiac and thoracic surgeries, spinal fusions, myomectomies, craniotomies, colon & liver resections, total joint revisions, same day c-section with placenta previa or accreta.  Result Value Ref Range   ABO/RH(D) A POS    Antibody Screen NEG    Sample Expiration 02/13/2018    Extend sample reason      NO TRANSFUSIONS OR PREGNANCY IN THE PAST 3 MONTHS Performed at [facility], [address], [place], [place] [address]     Chest 2 View  Result Date: 01/31/2018 CLINICAL DATA:  PREOP LUMBAR SURGERY. EXAM: CHEST - 2  VIEW COMPARISON:  12/12/2016 FINDINGS: The heart size and mediastinal contours are within normal limits. Aortic atherosclerosis noted. Both lungs are clear. Previous hardware fixation of the lower cervical spine. IMPRESSION: No active cardiopulmonary disease. Electronically Signed   By: Kerby Moors M.D.   On: 01/31/2018 10:47   Dg Lumbar Spine 2-3 Views  Result Date: 02/06/2018 CLINICAL DATA:  Degenerative spondylolisthesis at L4-5 with spinal stenosis. EXAM: LUMBAR SPINE - 2-3 VIEW; DG C-ARM 61-120 MIN COMPARISON:  MRI dated 12/06/2017 FINDINGS: AP and lateral C-arm images demonstrate the patient has undergone interbody and posterior fusion at L4-5. The hardware appears in excellent position in the AP and lateral projections. Minimal residual spondylolisthesis. IMPRESSION: Interbody and posterior fusion performed at L4-5. FLUOROSCOPY TIME:  1 minutes 8 seconds  C-arm fluoroscopic images were obtained intraoperatively and submitted for post operative interpretation. Electronically Signed   By: Lorriane Shire M.D.   On: 02/06/2018 11:41   Dg C-arm 1-60 Min  Result Date: 02/06/2018 CLINICAL DATA:  Degenerative spondylolisthesis at L4-5 with spinal stenosis. EXAM: LUMBAR SPINE - 2-3 VIEW; DG C-ARM 61-120 MIN COMPARISON:  MRI dated 12/06/2017 FINDINGS: AP and lateral C-arm images demonstrate the patient has undergone interbody and posterior fusion at L4-5. The hardware appears in excellent position in the AP and lateral projections. Minimal residual spondylolisthesis. IMPRESSION: Interbody and posterior fusion performed at L4-5. FLUOROSCOPY TIME:  1 minutes 8 seconds C-arm fluoroscopic images were obtained intraoperatively and submitted for post operative interpretation. Electronically Signed   By: Lorriane Shire M.D.   On: 02/06/2018 11:41   Dg C-arm 1-60 Min  Result Date: 02/06/2018 CLINICAL DATA:  Degenerative spondylolisthesis at L4-5 with spinal stenosis. EXAM: LUMBAR SPINE - 2-3 VIEW; DG C-ARM 61-120 MIN COMPARISON:  MRI dated 12/06/2017 FINDINGS: AP and lateral C-arm images demonstrate the patient has undergone interbody and posterior fusion at L4-5. The hardware appears in excellent position in the AP and lateral projections. Minimal residual spondylolisthesis. IMPRESSION: Interbody and posterior fusion performed at L4-5. FLUOROSCOPY TIME:  1 minutes 8 seconds C-arm fluoroscopic images were obtained intraoperatively and submitted for post operative interpretation. Electronically Signed   By: Lorriane Shire M.D.   On: 02/06/2018 11:41    Antibiotics:  Anti-infectives (From admission, onward)   Start     Dose/Rate Route Frequency Ordered Stop   02/06/18 1700  ceFAZolin (ANCEF) IVPB 2g/100 mL premix     2 g 200 mL/hr over 30 Minutes Intravenous Every 8 hours 02/06/18 1338 02/07/18 0056   02/06/18 1004  vancomycin (VANCOCIN) powder  Status:  Discontinued        As needed 02/06/18 1004 02/06/18 1145   02/06/18 0959  bacitracin 50,000 Units in sodium chloride 0.9 % 500 mL irrigation  Status:  Discontinued       As needed 02/06/18 1000 02/06/18 1145   02/06/18 0700  ceFAZolin (ANCEF) IVPB 2g/100 mL premix     2 g 200 mL/hr over 30 Minutes Intravenous On call to O.R. 02/06/18 0650 02/06/18 0847   02/06/18 0648  ceFAZolin (ANCEF) 2-4 GM/100ML-% IVPB    Note to Pharmacy:  Debbe Bales, Meredit: cabinet override      02/06/18 0648 02/06/18 0847      Discharge Exam: Blood pressure (!) 107/50, pulse (!) 56, temperature 98 F (36.7 C), temperature source Oral, resp. rate 18, height 5' 6.25" (1.683 m), weight 80 kg, SpO2 93 %. Neurologic: Grossly normal Ambulating and voiding well, incision cdi  Discharge Medications:   Allergies as of 02/07/2018  Reactions   Boniva [ibandronic Acid] Other (See Comments)   Bone Pain   Dilaudid [hydromorphone Hcl] Other (See Comments)   Hallucinations   Risedronate Sodium Other (See Comments)   Bone pain      Medication List    TAKE these medications   conjugated estrogens vaginal cream Commonly known as:  PREMARIN Place 1 g vaginally once a week.   CoQ10 100 MG Caps Take 100 mg by mouth 2 (two) times a week.   escitalopram 20 MG tablet Commonly known as:  LEXAPRO Take 20 mg by mouth daily.   fluticasone 50 MCG/ACT nasal spray Commonly known as:  FLONASE Place 2 sprays into the nose daily as needed for allergies or rhinitis.   HYDROcodone-acetaminophen 5-325 MG tablet Commonly known as:  NORCO/VICODIN Take 1 tablet by mouth every 4 (four) hours as needed for moderate pain.   losartan 50 MG tablet Commonly known as:  COZAAR Take 50 mg by mouth daily.   methocarbamol 500 MG tablet Commonly known as:  ROBAXIN Take 1 tablet (500 mg total) by mouth every 6 (six) hours as needed for muscle spasms. What changed:  Another medication with the same name was added. Make sure you understand how  and when to take each.   methocarbamol 500 MG tablet Commonly known as:  ROBAXIN Take 1 tablet (500 mg total) by mouth every 6 (six) hours as needed for muscle spasms. What changed:  You were already taking a medication with the same name, and this prescription was added. Make sure you understand how and when to take each.   OVER THE COUNTER MEDICATION Take 1 capsule by mouth daily as needed (pain). CBD OIL Caps 25 mg per capsule   REPATHA 140 MG/ML Sosy Generic drug:  Evolocumab Inject 140 mg into the skin every 14 (fourteen) days.   traZODone 100 MG tablet Commonly known as:  DESYREL Take 100 mg by mouth at bedtime.   vitamin B-12 1000 MCG tablet Commonly known as:  CYANOCOBALAMIN Take 2,000 mcg by mouth every morning.   Vitamin D 50 MCG (2000 UT) tablet Take 2,000 Units by mouth every morning.            Durable Medical Equipment  (From admission, onward)         Start     Ordered   02/06/18 1339  DME Walker rolling  Once    Question:  Patient needs a walker to treat with the following condition  Answer:  S/P lumbar fusion   02/06/18 1338   02/06/18 1339  DME 3 n 1  Once     02/06/18 1338          Disposition: home   Final Dx: plif L4-5  Discharge Instructions     Remove dressing in 72 hours   Complete by:  As directed    Call MD for:  difficulty breathing, headache or visual disturbances   Complete by:  As directed    Call MD for:  hives   Complete by:  As directed    Call MD for:  persistant dizziness or light-headedness   Complete by:  As directed    Call MD for:  persistant nausea and vomiting   Complete by:  As directed    Call MD for:  redness, tenderness, or signs of infection (pain, swelling, redness, odor or green/yellow discharge around incision site)   Complete by:  As directed    Call MD for:  severe uncontrolled pain  Complete by:  As directed    Call MD for:  temperature >100.4   Complete by:  As directed    Diet - low sodium heart  healthy   Complete by:  As directed    Driving Restrictions   Complete by:  As directed    No driving for 2 weeks, no riding in the car for 1 week   Increase activity slowly   Complete by:  As directed    Lifting restrictions   Complete by:  As directed    No lifting more than 8 lbs         Signed: Ocie Cornfield Meyran 02/07/2018, 7:27 AM

## 2018-02-07 NOTE — Anesthesia Postprocedure Evaluation (Signed)
Anesthesia Post Note  Patient: Bonnie Juarez  Procedure(s) Performed: Posterior Lumbar Interbody Fusion Lumbar Four-Five (N/A Spine Lumbar)     Patient location during evaluation: PACU Anesthesia Type: General Level of consciousness: awake and alert Pain management: pain level controlled Vital Signs Assessment: post-procedure vital signs reviewed and stable Respiratory status: spontaneous breathing, nonlabored ventilation, respiratory function stable and patient connected to nasal cannula oxygen Cardiovascular status: blood pressure returned to baseline and stable Postop Assessment: no apparent nausea or vomiting Anesthetic complications: no    Last Vitals:  Vitals:   02/07/18 0340 02/07/18 0726  BP: (!) 107/50 113/84  Pulse: (!) 56 62  Resp: 18 18  Temp: 36.7 C 36.6 C  SpO2: 93%     Last Pain:  Vitals:   02/07/18 0726  TempSrc: Oral  PainSc:                  [place]

## 2018-02-12 ENCOUNTER — Ambulatory Visit: Payer: PPO | Admitting: Internal Medicine

## 2018-03-14 DIAGNOSIS — F4310 Post-traumatic stress disorder, unspecified: Secondary | ICD-10-CM | POA: Diagnosis not present

## 2018-03-14 DIAGNOSIS — F411 Generalized anxiety disorder: Secondary | ICD-10-CM | POA: Diagnosis not present

## 2018-03-14 DIAGNOSIS — E785 Hyperlipidemia, unspecified: Secondary | ICD-10-CM | POA: Diagnosis not present

## 2018-03-14 LAB — LIPID PANEL
Chol/HDL Ratio: 2.1 ratio (ref 0.0–4.4)
Cholesterol, Total: 153 mg/dL (ref 100–199)
HDL: 73 mg/dL (ref >39)
LDL Calculated: 60 mg/dL (ref 0–99)
Triglycerides: 101 mg/dL (ref 0–149)
VLDL Cholesterol Cal: 20 mg/dL (ref 5–40)

## 2018-03-18 ENCOUNTER — Encounter: Payer: Self-pay | Admitting: Internal Medicine

## 2018-03-18 ENCOUNTER — Ambulatory Visit (INDEPENDENT_AMBULATORY_CARE_PROVIDER_SITE_OTHER): Payer: PPO | Admitting: Internal Medicine

## 2018-03-18 VITALS — BP 112/70 | HR 70 | Ht 66.0 in | Wt 177.0 lb

## 2018-03-18 DIAGNOSIS — R931 Abnormal findings on diagnostic imaging of heart and coronary circulation: Secondary | ICD-10-CM

## 2018-03-18 DIAGNOSIS — Z789 Other specified health status: Secondary | ICD-10-CM | POA: Diagnosis not present

## 2018-03-18 DIAGNOSIS — E782 Mixed hyperlipidemia: Secondary | ICD-10-CM | POA: Diagnosis not present

## 2018-03-18 DIAGNOSIS — Z8249 Family history of ischemic heart disease and other diseases of the circulatory system: Secondary | ICD-10-CM

## 2018-03-18 NOTE — Patient Instructions (Signed)
Medication Instructions:  Continue current medications If you need a refill on your cardiac medications before your next appointment, please call your pharmacy.   Lab work: FASTING lab work prior to next visit with Dr. Debara Pickett. We will mail you a lab order.   If you have labs (blood work) drawn today and your tests are completely normal, you will receive your results only by: Marland Kitchen MyChart Message (if you have MyChart) OR . A paper copy in the mail If you have any lab test that is abnormal or we need to change your treatment, we will call you to review the results.   Follow-Up: At [facility], you and your health needs are our priority.  As part of our continuing mission to provide you with exceptional heart care, we have created designated Provider Care Teams.  These Care Teams include your primary Cardiologist (physician) and Advanced Practice Providers (APPs -  Physician Assistants and Nurse Practitioners) who all work together to provide you with the care you need, when you need it. You will need a follow up appointment in 12 months.  Please call our office 2 months in advance to schedule this appointment.  You may see Pixie Casino, MD or one of the following Advanced Practice Providers on your designated Care Team: [place], [place] . Fabian Sharp, PA-C  Any Other Special Instructions Will Be Listed Below (If Applicable).

## 2018-03-18 NOTE — Progress Notes (Signed)
OFFICE CONSULT NOTE  Chief Complaint:  Follow-up dyslipidemia  Primary Care Physician: Cari Caraway, MD  HPI:  Bonnie Juarez is a 73 y.o. female who is being seen today for the evaluation of dyslipidemia at the request of Cari Caraway, MD. This is a pleasant 73 year old female patient is kindly referred to me for evaluation of dyslipidemia.  She has a strong family history of heart disease mostly in her mother who had 2 stents at age 57 and a stroke in her 35s.  There is also history of heart failure diabetes and possible heart attack in her siblings.  Personal medical problems include fibromyalgia and problems with her cervical spine which is been operated on, dyslipidemia, hypertension and history of melanoma.  She currently takes rosuvastatin 10 mg once weekly which is the most medicine she can tolerate.  In the past she is taken atorvastatin which caused significant myalgias as well as pravastatin which she tolerated however did not have significant improvement in her dyslipidemia.  She says this significantly affects her fibromyalgia.  Based on these numbers are most recent lipid profile in August showed total cholesterol 250, triglycerides 103, HDL 76 and LDL 154.  Although she has no known coronary disease, given her age, hypertension and family history of coronary disease, her goal LDL is likely less than 100.  At this point she is unlikely to reach that with additional therapies other than likely a PCSK9 inhibitor.  She reports being asymptomatic as far as no chest pain or worsening shortness of breath.  03/18/2018  Bonnie Juarez is seen today in follow-up.  She is doing very well.  She had spinal surgery in December.  She was having symptoms of sciatica in both legs which is improved significantly.  She is exercising more regularly and has less generalized pain.  She is tolerating the Repatha without any the side effects that she had previously on statins.  Fortunately she has had marked  reduction in her lipid profile.  Total cholesterol 4 days ago was 153, triglycerides 101, HDL 73 and LDL of 60.  This is a marked improvement in her lipid profile will certainly reduce her risk.  She did have a coronary calcium score in 2022-12-11 which showed low calcium score of 80, however there was scattered coronary calcifications and moderate diffuse calcifications of the aortic root and descending aorta.  PMHx:  Past Medical History:  Diagnosis Date  . Anxiety   . Arthritis   . Cancer ([place])    Hx: of squamouscell carcinoma on Left shin basal cell on face; melanoma on right lower leg  . Complication of anesthesia    Dec 11, 2003 deceased LOC and hypoxia overmedicating with Morphine and Phenergan; received Narcan X 2, overnight pulse ox  . Depression   . Fibromyalgia   . Frequent urination   . GERD (gastroesophageal reflux disease)    Hx: of  . H/O hiatal hernia    PMH: of  . Headache(784.0)   . Hypertension   . IBS (irritable bowel syndrome)    Hx: of  . Osteopenia    Hx: of per pt, pt. adds osteoporosis- DOS  . Pneumonia    hx  . PONV (postoperative nausea and vomiting)   . Sleep apnea    Hx: of in the past-?mild apnea 10 yrs ago- followed by dr and none found    Past Surgical History:  Procedure Laterality Date  . APPENDECTOMY    . ARCUATE KERATECTOMY    . BREAST LUMPECTOMY  Right 1991  . CERVICAL FUSION  1983  . FRACTURE SURGERY Right 2014   humerus  . HERNIA REPAIR     hiatal  . KNEE ARTHROSCOPY Right 2015  . [place] PLACATION  2010  . ORIF HUMERUS FRACTURE Right 01/08/2013   Procedure: OPEN REDUCTION INTERNAL FIXATION (ORIF) PROXIMAL HUMERUS FRACTURE;  Surgeon: Marybelle Killings, MD;  Location: [facility];  Service: Orthopedics;  Laterality: Right;  Open Reduction Internal Fixation Right Proximal Humerus Fracture  . POSTERIOR CERVICAL FUSION/FORAMINOTOMY N/A 12/20/2016   Procedure: Posterior Cervical Fusion with lateral mass fixation - Cervical Three-Thoracic Two;  Surgeon:  Eustace Moore, MD;  Location: [facility];  Service: Neurosurgery;  Laterality: N/A;  . RADIAL HEAD REPLACEMENT  2005  . trigger thrumb release Left 2001  . tubular plasity  1970    FAMHx:  Family History  Problem Relation Age of Onset  . Heart disease Other   . Lung disease Other     SOCHx:   reports that she quit smoking about 37 years ago. Her smoking use included cigarettes. She has a 15.00 pack-year smoking history. She has never used smokeless tobacco. She reports current alcohol use of about 7.0 - 10.0 standard drinks of alcohol per week. She reports that she does not use drugs.  ALLERGIES:  Allergies  Allergen Reactions  . Boniva [Ibandronic Acid] Other (See Comments)    Bone Pain  . Dilaudid [Hydromorphone Hcl] Other (See Comments)    Hallucinations  . Risedronate Sodium Other (See Comments)    Bone pain    ROS: Pertinent items noted in HPI and remainder of comprehensive ROS otherwise negative.  HOME MEDS: Current Outpatient Medications on File Prior to Visit  Medication Sig Dispense Refill  . Cholecalciferol (VITAMIN D) 50 MCG (2000 UT) tablet Take 2,000 Units by mouth every morning.     . Coenzyme Q10 (COQ10) 100 MG CAPS Take 100 mg by mouth 2 (two) times a week.     . conjugated estrogens (PREMARIN) vaginal cream Place 1 g vaginally once a week.     . escitalopram (LEXAPRO) 20 MG tablet Take 20 mg by mouth daily.   2  . Evolocumab (REPATHA) 140 MG/ML SOSY Inject 140 mg into the skin every 14 (fourteen) days.    . fluticasone (FLONASE) 50 MCG/ACT nasal spray Place 2 sprays into the nose daily as needed for allergies or rhinitis.     Marland Kitchen losartan (COZAAR) 50 MG tablet Take 50 mg by mouth daily.    Marland Kitchen OVER THE COUNTER MEDICATION Take 1 capsule by mouth daily as needed (pain). CBD OIL Caps 25 mg per capsule     . traZODone (DESYREL) 100 MG tablet Take 100 mg by mouth at bedtime.   2  . vitamin B-12 (CYANOCOBALAMIN) 1000 MCG tablet Take 2,000 mcg by mouth every morning.      No current facility-administered medications on file prior to visit.     LABS/IMAGING: No results found for this or any previous visit (from the past 48 hour(s)). No results found.  LIPID PANEL:    Component Value Date/Time   CHOL 153 03/14/2018 1011   TRIG 101 03/14/2018 1011   HDL 73 03/14/2018 1011   CHOLHDL 2.1 03/14/2018 1011   LDLCALC 60 03/14/2018 1011    WEIGHTS: Wt Readings from Last 3 Encounters:  03/18/18 177 lb (80.3 kg)  02/06/18 176 lb 6.4 oz (80 kg)  01/30/18 176 lb 6.4 oz (80 kg)    VITALS: BP 112/70  Pulse 70   Ht 5\' 6"  (1.676 m)   Wt 177 lb (80.3 kg)   BMI 28.57 kg/m   EXAM: Deferred  EKG: Deferred  ASSESSMENT: 1. Mixed dyslipidemia  2. Mild coronary artery calcification-CAC 80 (10/2017) 3. Strong family history of coronary disease 4. Hypertension 5. Statin intolerance-on Repatha  PLAN: 1.   Ms. Harl has had marked improvement in her dyslipidemia on Repatha.  She is now at goal with LDL less than 70 and an overall favorable lipid profile.  She did have some minor coronary artery calcification and does have a strong family history of heart disease.  She seems to be tolerating injections and will continue this.  She has approval of the Repatha until the end of this year.  Follow-up with me annually or sooner as necessary.  Pixie Casino, MD, [facility], [place] Director of the [facility]  Card[facility] Diplomate of the American Board of Clinical Lipidology Attending Cardiologist  Direct Dial: [phone]  Fax: [phone]  Website:  [email or web address] 03/18/2018, 9:39 AM

## 2018-03-26 DIAGNOSIS — M4316 Spondylolisthesis, lumbar region: Secondary | ICD-10-CM | POA: Diagnosis not present

## 2018-03-26 DIAGNOSIS — M7062 Trochanteric bursitis, left hip: Secondary | ICD-10-CM | POA: Diagnosis not present

## 2018-03-26 DIAGNOSIS — M7061 Trochanteric bursitis, right hip: Secondary | ICD-10-CM | POA: Diagnosis not present

## 2018-04-16 DIAGNOSIS — E782 Mixed hyperlipidemia: Secondary | ICD-10-CM | POA: Diagnosis not present

## 2018-04-16 DIAGNOSIS — G479 Sleep disorder, unspecified: Secondary | ICD-10-CM | POA: Diagnosis not present

## 2018-04-16 DIAGNOSIS — I700 Atherosclerosis of aorta: Secondary | ICD-10-CM | POA: Diagnosis not present

## 2018-04-16 DIAGNOSIS — I10 Essential (primary) hypertension: Secondary | ICD-10-CM | POA: Diagnosis not present

## 2018-04-16 DIAGNOSIS — M545 Low back pain: Secondary | ICD-10-CM | POA: Diagnosis not present

## 2018-04-16 DIAGNOSIS — M810 Age-related osteoporosis without current pathological fracture: Secondary | ICD-10-CM | POA: Diagnosis not present

## 2018-04-16 DIAGNOSIS — K219 Gastro-esophageal reflux disease without esophagitis: Secondary | ICD-10-CM | POA: Diagnosis not present

## 2018-04-16 DIAGNOSIS — R350 Frequency of micturition: Secondary | ICD-10-CM | POA: Diagnosis not present

## 2018-04-16 DIAGNOSIS — F3341 Major depressive disorder, recurrent, in partial remission: Secondary | ICD-10-CM | POA: Diagnosis not present

## 2018-04-16 DIAGNOSIS — J309 Allergic rhinitis, unspecified: Secondary | ICD-10-CM | POA: Diagnosis not present

## 2018-04-16 DIAGNOSIS — R05 Cough: Secondary | ICD-10-CM | POA: Diagnosis not present

## 2018-04-16 DIAGNOSIS — R7303 Prediabetes: Secondary | ICD-10-CM | POA: Diagnosis not present

## 2018-04-16 DIAGNOSIS — Z23 Encounter for immunization: Secondary | ICD-10-CM | POA: Diagnosis not present

## 2018-05-14 DIAGNOSIS — M542 Cervicalgia: Secondary | ICD-10-CM | POA: Diagnosis not present

## 2018-05-14 DIAGNOSIS — M4316 Spondylolisthesis, lumbar region: Secondary | ICD-10-CM | POA: Diagnosis not present

## 2018-05-16 DIAGNOSIS — M79605 Pain in left leg: Secondary | ICD-10-CM | POA: Diagnosis not present

## 2018-05-16 DIAGNOSIS — M79604 Pain in right leg: Secondary | ICD-10-CM | POA: Diagnosis not present

## 2018-05-16 DIAGNOSIS — M6281 Muscle weakness (generalized): Secondary | ICD-10-CM | POA: Diagnosis not present

## 2018-05-16 DIAGNOSIS — M545 Low back pain: Secondary | ICD-10-CM | POA: Diagnosis not present

## 2018-05-23 DIAGNOSIS — M961 Postlaminectomy syndrome, not elsewhere classified: Secondary | ICD-10-CM | POA: Diagnosis not present

## 2018-05-23 DIAGNOSIS — M79604 Pain in right leg: Secondary | ICD-10-CM | POA: Diagnosis not present

## 2018-05-23 DIAGNOSIS — R29898 Other symptoms and signs involving the musculoskeletal system: Secondary | ICD-10-CM | POA: Diagnosis not present

## 2018-05-23 DIAGNOSIS — M79605 Pain in left leg: Secondary | ICD-10-CM | POA: Diagnosis not present

## 2018-05-30 DIAGNOSIS — M79604 Pain in right leg: Secondary | ICD-10-CM | POA: Diagnosis not present

## 2018-05-30 DIAGNOSIS — M542 Cervicalgia: Secondary | ICD-10-CM | POA: Diagnosis not present

## 2018-05-30 DIAGNOSIS — Z08 Encounter for follow-up examination after completed treatment for malignant neoplasm: Secondary | ICD-10-CM | POA: Diagnosis not present

## 2018-05-30 DIAGNOSIS — X32XXXD Exposure to sunlight, subsequent encounter: Secondary | ICD-10-CM | POA: Diagnosis not present

## 2018-05-30 DIAGNOSIS — Z85828 Personal history of other malignant neoplasm of skin: Secondary | ICD-10-CM | POA: Diagnosis not present

## 2018-05-30 DIAGNOSIS — L821 Other seborrheic keratosis: Secondary | ICD-10-CM | POA: Diagnosis not present

## 2018-05-30 DIAGNOSIS — L570 Actinic keratosis: Secondary | ICD-10-CM | POA: Diagnosis not present

## 2018-06-27 DIAGNOSIS — R05 Cough: Secondary | ICD-10-CM | POA: Diagnosis not present

## 2018-06-27 DIAGNOSIS — K219 Gastro-esophageal reflux disease without esophagitis: Secondary | ICD-10-CM | POA: Diagnosis not present

## 2018-06-27 DIAGNOSIS — J309 Allergic rhinitis, unspecified: Secondary | ICD-10-CM | POA: Diagnosis not present

## 2018-07-09 DIAGNOSIS — F411 Generalized anxiety disorder: Secondary | ICD-10-CM | POA: Diagnosis not present

## 2018-07-09 DIAGNOSIS — F4310 Post-traumatic stress disorder, unspecified: Secondary | ICD-10-CM | POA: Diagnosis not present

## 2018-07-21 DIAGNOSIS — H00016 Hordeolum externum left eye, unspecified eyelid: Secondary | ICD-10-CM | POA: Diagnosis not present

## 2018-08-07 ENCOUNTER — Other Ambulatory Visit: Payer: Self-pay | Admitting: Family Medicine

## 2018-08-07 DIAGNOSIS — Z1231 Encounter for screening mammogram for malignant neoplasm of breast: Secondary | ICD-10-CM

## 2018-08-21 ENCOUNTER — Telehealth: Payer: Self-pay | Admitting: Internal Medicine

## 2018-08-21 DIAGNOSIS — Z03818 Encounter for observation for suspected exposure to other biological agents ruled out: Secondary | ICD-10-CM | POA: Diagnosis not present

## 2018-08-21 NOTE — Telephone Encounter (Signed)
Faxed MD portion of amgen safety net patient assistance application for repatha refills to [phone]

## 2018-09-09 DIAGNOSIS — H01005 Unspecified blepharitis left lower eyelid: Secondary | ICD-10-CM | POA: Diagnosis not present

## 2018-09-10 DIAGNOSIS — M961 Postlaminectomy syndrome, not elsewhere classified: Secondary | ICD-10-CM | POA: Diagnosis not present

## 2018-09-10 DIAGNOSIS — M79604 Pain in right leg: Secondary | ICD-10-CM | POA: Diagnosis not present

## 2018-09-10 DIAGNOSIS — M542 Cervicalgia: Secondary | ICD-10-CM | POA: Diagnosis not present

## 2018-09-17 ENCOUNTER — Telehealth: Payer: Self-pay | Admitting: Orthopaedic Surgery

## 2018-09-17 DIAGNOSIS — M797 Fibromyalgia: Secondary | ICD-10-CM

## 2018-09-17 NOTE — Telephone Encounter (Signed)
Patient called advised she never got a call back for an appointment with Dr Estanislado Pandy and she was referred a long time ago. The number to contact patient is [phone]

## 2018-09-18 DIAGNOSIS — M961 Postlaminectomy syndrome, not elsewhere classified: Secondary | ICD-10-CM | POA: Diagnosis not present

## 2018-09-20 NOTE — Telephone Encounter (Signed)
Ok to make referral thank you

## 2018-09-20 NOTE — Telephone Encounter (Signed)
Please see below.  Dr. Lorin Mercy would like to refer to Dr. Estanislado Pandy. Will she be willing to see patient? If so, I will enter referral. Thanks.

## 2018-09-20 NOTE — Telephone Encounter (Signed)
I called patient. She states that she has been treated for fibromyalgia since 2000 and the rheumatologist that she saw has now passed away. She thought that you referred her to Dr. Estanislado Pandy a long time ago, but never heard from their office. Patient is requesting referral to rheumatology. Please advise.  FYI-Patient has appt next week with you to discuss other problems that she is having.

## 2018-09-23 NOTE — Telephone Encounter (Signed)
Dr. Estanislado Pandy will not see patient for fibromyalgia. Would you prefer I refer patient somewhere else?

## 2018-09-23 NOTE — Telephone Encounter (Signed)
Please review

## 2018-09-23 NOTE — Telephone Encounter (Signed)
If this referral is for Fibromyalgia, Dr. Estanislado Pandy does not take referrals for Fibromyalgia . Patient will need to see her PCP for this. Thanks!

## 2018-09-24 DIAGNOSIS — M961 Postlaminectomy syndrome, not elsewhere classified: Secondary | ICD-10-CM | POA: Diagnosis not present

## 2018-09-24 DIAGNOSIS — Z981 Arthrodesis status: Secondary | ICD-10-CM | POA: Diagnosis not present

## 2018-09-24 DIAGNOSIS — M48061 Spinal stenosis, lumbar region without neurogenic claudication: Secondary | ICD-10-CM | POA: Diagnosis not present

## 2018-09-24 NOTE — Telephone Encounter (Signed)
Ok to send elsewhere thanks.  Obviously I have not seen her since 10/2017. Thank you

## 2018-09-25 NOTE — Addendum Note (Signed)
Addended by: Meyer Cory on: 09/25/2018 03:11 PM   Modules accepted: Orders

## 2018-09-25 NOTE — Telephone Encounter (Signed)
I called patient and advised. She would like referral somewhere else. Referral entered for [facility]. Referral entered, phone number given to patient.

## 2018-09-26 ENCOUNTER — Ambulatory Visit
Admission: RE | Admit: 2018-09-26 | Discharge: 2018-09-26 | Disposition: A | Discharge status: Home / Self Care | Payer: PPO | Source: Ambulatory Visit | Attending: Family Medicine | Admitting: Family Medicine

## 2018-09-26 ENCOUNTER — Other Ambulatory Visit: Payer: Self-pay

## 2018-09-26 DIAGNOSIS — Z1231 Encounter for screening mammogram for malignant neoplasm of breast: Secondary | ICD-10-CM

## 2018-10-02 ENCOUNTER — Ambulatory Visit (INDEPENDENT_AMBULATORY_CARE_PROVIDER_SITE_OTHER): Payer: PPO | Admitting: Orthopaedic Surgery

## 2018-10-02 ENCOUNTER — Encounter: Payer: Self-pay | Admitting: Orthopaedic Surgery

## 2018-10-02 DIAGNOSIS — M722 Plantar fascial fibromatosis: Secondary | ICD-10-CM

## 2018-10-02 NOTE — Progress Notes (Signed)
Office Visit Note   Patient: Bonnie Juarez           Date of Birth: 1945/04/07           MRN: [id number] Visit Date: 10/02/2018              Requested by: Cari Caraway, [place],   [id number] PCP: Cari Caraway, MD   Assessment & Plan: Visit Diagnoses:  1. Plantar fasciitis of left foot     Plan: Plantar fashion injection performed with improvement in her pain.  She will return if she has persistent problems.  We discussed heel cord stretching plantar fascial stretching rolling the bottom of her foot with a frozen lemonade or orange juice can.  Visco heel inserts given which she used today with improvement in her pain.  Follow-up if she has persistent problems.  Follow-Up Instructions: No follow-ups on file.   Orders:  No orders of the defined types were placed in this encounter.  No orders of the defined types were placed in this encounter.     Procedures: No procedures performed   Clinical Data: No additional findings.   Subjective: Chief Complaint  Patient presents with  . Right Leg - Pain  . Left Leg - Pain    HPI 73 year old female is seen with pain in her left heel.  Previous lumbar surgery 01/2018.  Heel pain is worse when she first gets up but better late morning gets worse later on in the day.  Does not matter whether she has shoes on or not.  She has some discomfort in the arch of her foot up to her knees little bit more on the left than right but the left heel is very painful.  She gets relief if she gets off her foot.  She states she is also had some aching all over and is requesting referral for rheumatologist for fibromyalgia diagnosis she states she has.  She had a recent MRI of her back she is seeing Dr. Ronnald Ramp for follow-up of this.  Patient is used CBD oil, muscle relaxants and some ice on her heel.  Previous cervical fusion doing well.  Review of Systems 14 point review of systems update unchanged from 10/30/2017 office visit  other than as mentioned in HPI.   Objective: Vital Signs: Ht 5\' 6"  (1.676 m)   Wt 180 lb (81.6 kg)   BMI 29.05 kg/m   Physical Exam Constitutional:      Appearance: She is well-developed.  HENT:     Head: Normocephalic.     Right Ear: External ear normal.     Left Ear: External ear normal.  Eyes:     Pupils: Pupils are equal, round, and reactive to light.  Neck:     Thyroid: No thyromegaly.     Trachea: No tracheal deviation.  Cardiovascular:     Rate and Rhythm: Normal rate.  Pulmonary:     Effort: Pulmonary effort is normal.  Abdominal:     Palpations: Abdomen is soft.  Skin:    General: Skin is warm and dry.  Neurological:     Mental Status: She is alert and oriented to person, place, and time.  Psychiatric:        Behavior: Behavior normal.     Ortho Exam well-healed cervical incision well-healed lumbar incision.  Mild discomfort with the trochanteric bursal palpation and pressure.  Knees reach good extension.  She has exquisite tenderness of the plantar fascia origin on  her left heel.  No plantar fascial nodules.  Anterior tib gastrocsoleus is strong.  Specialty Comments:  No specialty comments available.  Imaging: No results found.   PMFS History: Patient Active Problem List   Diagnosis Date Noted  . Plantar fasciitis of left foot 10/02/2018  . S/P lumbar fusion 02/06/2018  . Mixed dyslipidemia 11/13/2017  . Family history of heart disease 11/13/2017  . Statin intolerance 11/13/2017  . Bilateral primary osteoarthritis of knee 10/30/2017  . S/P cervical spinal fusion 12/20/2016  . Spondylosis without myelopathy or radiculopathy, lumbar region 10/05/2016  . Chronic bilateral low back pain without sciatica 10/05/2016  . Fibromyalgia 10/05/2016  . Closed fracture of right proximal humerus 01/08/2013    Class: Acute   Past Medical History:  Diagnosis Date  . Anxiety   . Arthritis   . Cancer ([place])    Hx: of squamouscell carcinoma on Left shin basal  cell on face; melanoma on right lower leg  . Complication of anesthesia    11-29-03 deceased LOC and hypoxia overmedicating with Morphine and Phenergan; received Narcan X 2, overnight pulse ox  . Depression   . Fibromyalgia   . Frequent urination   . GERD (gastroesophageal reflux disease)    Hx: of  . H/O hiatal hernia    PMH: of  . Headache(784.0)   . Hypertension   . IBS (irritable bowel syndrome)    Hx: of  . Osteopenia    Hx: of per pt, pt. adds osteoporosis- DOS  . Pneumonia    hx  . PONV (postoperative nausea and vomiting)   . Sleep apnea    Hx: of in the past-?mild apnea 10 yrs ago- followed by dr and none found    Family History  Problem Relation Age of Onset  . Heart disease Other   . Lung disease Other     Past Surgical History:  Procedure Laterality Date  . APPENDECTOMY    . ARCUATE KERATECTOMY    . BREAST LUMPECTOMY Right 1991  . CERVICAL FUSION  1983  . FRACTURE SURGERY Right 2014   humerus  . HERNIA REPAIR     hiatal  . KNEE ARTHROSCOPY Right 2015  . [place] PLACATION  2010  . ORIF HUMERUS FRACTURE Right 01/08/2013   Procedure: OPEN REDUCTION INTERNAL FIXATION (ORIF) PROXIMAL HUMERUS FRACTURE;  Surgeon: Marybelle Killings, MD;  Location: [facility];  Service: Orthopedics;  Laterality: Right;  Open Reduction Internal Fixation Right Proximal Humerus Fracture  . POSTERIOR CERVICAL FUSION/FORAMINOTOMY N/A 12/20/2016   Procedure: Posterior Cervical Fusion with lateral mass fixation - Cervical Three-Thoracic Two;  Surgeon: Eustace Moore, MD;  Location: [facility];  Service: Neurosurgery;  Laterality: N/A;  . RADIAL HEAD REPLACEMENT  2005  . trigger thrumb release Left 2001  . tubular plasity  1970   Social History   Occupational History  . Not on file  Tobacco Use  . Smoking status: Former Smoker    Packs/day: 1.00    Years: 15.00    Pack years: 15.00    Types: Cigarettes    Quit date: 12/12/1980    Years since quitting: 37.8  . Smokeless tobacco: Never Used  .  Tobacco comment: Quit 1982"  Substance and Sexual Activity  . Alcohol use: Yes    Alcohol/week: 7.0 - 10.0 standard drinks    Types: 7 - 10 Glasses of wine per week  . Drug use: No  . Sexual activity: Not on file

## 2018-10-03 DIAGNOSIS — M79605 Pain in left leg: Secondary | ICD-10-CM | POA: Diagnosis not present

## 2018-10-08 DIAGNOSIS — F4310 Post-traumatic stress disorder, unspecified: Secondary | ICD-10-CM | POA: Diagnosis not present

## 2018-10-08 DIAGNOSIS — F411 Generalized anxiety disorder: Secondary | ICD-10-CM | POA: Diagnosis not present

## 2018-10-14 DIAGNOSIS — B351 Tinea unguium: Secondary | ICD-10-CM | POA: Diagnosis not present

## 2018-10-14 DIAGNOSIS — M79672 Pain in left foot: Secondary | ICD-10-CM | POA: Diagnosis not present

## 2018-10-14 DIAGNOSIS — M7732 Calcaneal spur, left foot: Secondary | ICD-10-CM | POA: Diagnosis not present

## 2018-10-14 DIAGNOSIS — M79671 Pain in right foot: Secondary | ICD-10-CM | POA: Diagnosis not present

## 2018-10-14 DIAGNOSIS — M722 Plantar fascial fibromatosis: Secondary | ICD-10-CM | POA: Diagnosis not present

## 2018-10-24 DIAGNOSIS — Z0000 Encounter for general adult medical examination without abnormal findings: Secondary | ICD-10-CM | POA: Diagnosis not present

## 2018-10-24 DIAGNOSIS — B351 Tinea unguium: Secondary | ICD-10-CM | POA: Diagnosis not present

## 2018-10-24 DIAGNOSIS — K219 Gastro-esophageal reflux disease without esophagitis: Secondary | ICD-10-CM | POA: Diagnosis not present

## 2018-10-24 DIAGNOSIS — Z23 Encounter for immunization: Secondary | ICD-10-CM | POA: Diagnosis not present

## 2018-10-24 DIAGNOSIS — F419 Anxiety disorder, unspecified: Secondary | ICD-10-CM | POA: Diagnosis not present

## 2018-10-24 DIAGNOSIS — F3342 Major depressive disorder, recurrent, in full remission: Secondary | ICD-10-CM | POA: Diagnosis not present

## 2018-10-24 DIAGNOSIS — M722 Plantar fascial fibromatosis: Secondary | ICD-10-CM | POA: Diagnosis not present

## 2018-10-24 DIAGNOSIS — M810 Age-related osteoporosis without current pathological fracture: Secondary | ICD-10-CM | POA: Diagnosis not present

## 2018-10-24 DIAGNOSIS — I10 Essential (primary) hypertension: Secondary | ICD-10-CM | POA: Diagnosis not present

## 2018-10-24 DIAGNOSIS — E782 Mixed hyperlipidemia: Secondary | ICD-10-CM | POA: Diagnosis not present

## 2018-10-24 DIAGNOSIS — G479 Sleep disorder, unspecified: Secondary | ICD-10-CM | POA: Diagnosis not present

## 2018-11-06 DIAGNOSIS — L748 Other eccrine sweat disorders: Secondary | ICD-10-CM | POA: Diagnosis not present

## 2018-11-06 DIAGNOSIS — Z85820 Personal history of malignant melanoma of skin: Secondary | ICD-10-CM | POA: Diagnosis not present

## 2018-11-06 DIAGNOSIS — D045 Carcinoma in situ of skin of trunk: Secondary | ICD-10-CM | POA: Diagnosis not present

## 2018-11-06 DIAGNOSIS — L570 Actinic keratosis: Secondary | ICD-10-CM | POA: Diagnosis not present

## 2018-11-06 DIAGNOSIS — Z85828 Personal history of other malignant neoplasm of skin: Secondary | ICD-10-CM | POA: Diagnosis not present

## 2018-11-06 DIAGNOSIS — L821 Other seborrheic keratosis: Secondary | ICD-10-CM | POA: Diagnosis not present

## 2018-11-06 DIAGNOSIS — Z86018 Personal history of other benign neoplasm: Secondary | ICD-10-CM | POA: Diagnosis not present

## 2018-11-06 DIAGNOSIS — L814 Other melanin hyperpigmentation: Secondary | ICD-10-CM | POA: Diagnosis not present

## 2018-11-06 DIAGNOSIS — D225 Melanocytic nevi of trunk: Secondary | ICD-10-CM | POA: Diagnosis not present

## 2018-11-06 DIAGNOSIS — H0014 Chalazion left upper eyelid: Secondary | ICD-10-CM | POA: Diagnosis not present

## 2018-11-06 DIAGNOSIS — D485 Neoplasm of uncertain behavior of skin: Secondary | ICD-10-CM | POA: Diagnosis not present

## 2018-11-07 ENCOUNTER — Encounter: Payer: Self-pay | Admitting: Surgery

## 2018-11-07 ENCOUNTER — Other Ambulatory Visit: Payer: Self-pay

## 2018-11-07 ENCOUNTER — Ambulatory Visit (INDEPENDENT_AMBULATORY_CARE_PROVIDER_SITE_OTHER): Payer: PPO

## 2018-11-07 ENCOUNTER — Telehealth: Payer: Self-pay | Admitting: Radiology

## 2018-11-07 ENCOUNTER — Ambulatory Visit: Payer: Self-pay

## 2018-11-07 ENCOUNTER — Ambulatory Visit (INDEPENDENT_AMBULATORY_CARE_PROVIDER_SITE_OTHER): Payer: PPO | Admitting: Surgery

## 2018-11-07 VITALS — Ht 66.0 in | Wt 180.0 lb

## 2018-11-07 DIAGNOSIS — M170 Bilateral primary osteoarthritis of knee: Secondary | ICD-10-CM

## 2018-11-07 DIAGNOSIS — M25562 Pain in left knee: Secondary | ICD-10-CM

## 2018-11-07 DIAGNOSIS — G8929 Other chronic pain: Secondary | ICD-10-CM | POA: Diagnosis not present

## 2018-11-07 DIAGNOSIS — M25561 Pain in right knee: Secondary | ICD-10-CM

## 2018-11-07 MED ORDER — LIDOCAINE HCL 1 % IJ SOLN
3.0000 mL | INTRAMUSCULAR | Status: AC | PRN
  Administered 2018-11-07: 3 mL

## 2018-11-07 MED ORDER — BUPIVACAINE HCL 0.25 % IJ SOLN
6.0000 mL | INTRAMUSCULAR | Status: AC | PRN
  Administered 2018-11-07: 6 mL via INTRA_ARTICULAR

## 2018-11-07 MED ORDER — METHYLPREDNISOLONE ACETATE 40 MG/ML IJ SUSP
40.0000 mg | INTRAMUSCULAR | Status: AC | PRN
  Administered 2018-11-07: 40 mg via INTRA_ARTICULAR

## 2018-11-07 NOTE — Progress Notes (Signed)
Office Visit Note   Patient: Bonnie Juarez           Date of Birth: 1945/03/11           MRN: [id number] Visit Date: 11/07/2018              Requested by: Cari Caraway, [place],  [place] [id number] PCP: Cari Caraway, MD   Assessment & Plan: Visit Diagnoses:  1. Chronic pain of both knees   2. Bilateral primary osteoarthritis of knee     Plan: Today I did elect to repeat bilateral knee intra-articular Marcaine/Depo-Medrol injections.  After patient consent bilateral knees were prepped with Betadine and injections were performed.  Patient does state that the right knee is worse and understands it ultimately will come down to her needing total knee replacement.  She would like to try to exhaust all conservative measures before going that route.  I will have her follow-up with me in 2 months for recheck and I will plan to start bilateral knee viscosupplementation series at that time.  All questions answered.  Follow-Up Instructions: Return in about 2 months (around 01/07/2019) for With Jeneen Rinks for bilateral knee Visco supplementation.   Orders:  Orders Placed This Encounter  Procedures  . Large Joint Inj  . XR KNEE 3 VIEW LEFT  . XR KNEE 3 VIEW RIGHT   No orders of the defined types were placed in this encounter.     Procedures: Large Joint Inj: bilateral knee on 11/07/2018 10:38 AM Indications: pain Details: 25 G 1.5 in needle, anteromedial approach Medications (Right): 3 mL lidocaine 1 %; 6 mL bupivacaine 0.25 %; 40 mg methylPREDNISolone acetate 40 MG/ML Medications (Left): 3 mL lidocaine 1 %; 6 mL bupivacaine 0.25 %; 40 mg methylPREDNISolone acetate 40 MG/ML Outcome: tolerated well, no immediate complications Consent was given by the patient. Patient was prepped and draped in the usual sterile fashion.       Clinical Data: No additional findings.   Subjective: Chief Complaint  Patient presents with  . Right Knee - Pain  . Left Knee - Pain     HPI 73 year old white female history of bilateral knee osteoarthritis and pain returns.  Patient requesting bilateral knee Marcaine/Depo-Medrol injections.  She last had these done September 2019 and had good temporary relief.  States that currently right knee worse than left.  Knee pain with ambulation, squatting, stairs.  She understands that ultimately it will come down her needing total knee replacements.  She underwent L4-5 posterior lumbar interbody fusion by Dr. Sherley Bounds December 2019.  States that she is recovering well from this. Review of Systems No current cardiac pulmonary GI GU issues  Objective: Vital Signs: Ht 5\' 6"  (1.676 m)   Wt 180 lb (81.6 kg)   BMI 29.05 kg/m   Physical Exam HENT:     Head: Normocephalic and atraumatic.  Eyes:     Extraocular Movements: Extraocular movements intact.     Pupils: Pupils are equal, round, and reactive to light.  Musculoskeletal:     Comments: Gait is somewhat antalgic.  Negative logroll bilateral hips.  Negative straight leg raise.  Bilateral knees good range of motion.  Positive bilateral knee patellofemoral crepitus.  Positive patellar grind.  Medial lateral joint line tenderness.  Some swelling without large effusions.  Ligaments are stable.  Bilateral calves nontender.  Neurovascular intact.  Skin:    General: Skin is warm and dry.  Neurological:     General: No  focal deficit present.     Mental Status: She is alert and oriented to person, place, and time.  Psychiatric:        Mood and Affect: Mood normal.     Ortho Exam  Specialty Comments:  No specialty comments available.  Imaging: Xr Knee 3 View Left  Result Date: 11/07/2018 X-ray left knee shows tricompartmental degenerative changes.  Mild to moderate changes patellofemoral compartment.  No acute finding.  Xr Knee 3 View Right  Result Date: 11/07/2018 X-ray right knee shows moderate patellofemoral changes.  Inferior patella spur.  Lateral greater than medial  compartment narrowing.  No acute finding.    PMFS History: Patient Active Problem List   Diagnosis Date Noted  . Plantar fasciitis of left foot 10/02/2018  . S/P lumbar fusion 02/06/2018  . Mixed dyslipidemia 11/13/2017  . Family history of heart disease 11/13/2017  . Statin intolerance 11/13/2017  . Bilateral primary osteoarthritis of knee 10/30/2017  . S/P cervical spinal fusion 12/20/2016  . Spondylosis without myelopathy or radiculopathy, lumbar region 10/05/2016  . Chronic bilateral low back pain without sciatica 10/05/2016  . Fibromyalgia 10/05/2016  . Closed fracture of right proximal humerus 01/08/2013    Class: Acute   Past Medical History:  Diagnosis Date  . Anxiety   . Arthritis   . Cancer ([place])    Hx: of squamouscell carcinoma on Left shin basal cell on face; melanoma on right lower leg  . Complication of anesthesia    02-Dec-2003 deceased LOC and hypoxia overmedicating with Morphine and Phenergan; received Narcan X 2, overnight pulse ox  . Depression   . Fibromyalgia   . Frequent urination   . GERD (gastroesophageal reflux disease)    Hx: of  . H/O hiatal hernia    PMH: of  . Headache(784.0)   . Hypertension   . IBS (irritable bowel syndrome)    Hx: of  . Osteopenia    Hx: of per pt, pt. adds osteoporosis- DOS  . Pneumonia    hx  . PONV (postoperative nausea and vomiting)   . Sleep apnea    Hx: of in the past-?mild apnea 10 yrs ago- followed by dr and none found    Family History  Problem Relation Age of Onset  . Heart disease Other   . Lung disease Other     Past Surgical History:  Procedure Laterality Date  . APPENDECTOMY    . ARCUATE KERATECTOMY    . BREAST LUMPECTOMY Right 1991  . CERVICAL FUSION  1983  . FRACTURE SURGERY Right 2014   humerus  . HERNIA REPAIR     hiatal  . KNEE ARTHROSCOPY Right 2015  . [place] PLACATION  2010  . ORIF HUMERUS FRACTURE Right 01/08/2013   Procedure: OPEN REDUCTION INTERNAL FIXATION (ORIF) PROXIMAL HUMERUS  FRACTURE;  Surgeon: Marybelle Killings, MD;  Location: [facility];  Service: Orthopedics;  Laterality: Right;  Open Reduction Internal Fixation Right Proximal Humerus Fracture  . POSTERIOR CERVICAL FUSION/FORAMINOTOMY N/A 12/20/2016   Procedure: Posterior Cervical Fusion with lateral mass fixation - Cervical Three-Thoracic Two;  Surgeon: Eustace Moore, MD;  Location: [facility];  Service: Neurosurgery;  Laterality: N/A;  . RADIAL HEAD REPLACEMENT  2005  . trigger thrumb release Left 2001  . tubular plasity  1970   Social History   Occupational History  . Not on file  Tobacco Use  . Smoking status: Former Smoker    Packs/day: 1.00    Years: 15.00    Pack  years: 15.00    Types: Cigarettes    Quit date: 12/12/1980    Years since quitting: 37.9  . Smokeless tobacco: Never Used  . Tobacco comment: Quit 1982"  Substance and Sexual Activity  . Alcohol use: Yes    Alcohol/week: 7.0 - 10.0 standard drinks    Types: 7 - 10 Glasses of wine per week  . Drug use: No  . Sexual activity: Not on file

## 2018-11-07 NOTE — Telephone Encounter (Signed)
[facility] pre-cert for bilateral knee gel injections. Thank you

## 2018-11-08 NOTE — Telephone Encounter (Signed)
Noted  

## 2018-11-14 ENCOUNTER — Telehealth: Payer: Self-pay

## 2018-11-14 NOTE — Telephone Encounter (Signed)
Submitted VOB for Monovisc, bilateral knee. 

## 2018-11-21 ENCOUNTER — Telehealth: Payer: Self-pay

## 2018-11-21 DIAGNOSIS — D045 Carcinoma in situ of skin of trunk: Secondary | ICD-10-CM | POA: Diagnosis not present

## 2018-11-21 NOTE — Telephone Encounter (Signed)
Approved for Monovisc, bilateral knee. [place] Patient is covered through insurance. ([employer]) No Co-pay No PA required  Appt. 01/08/2019 with Benjiman Core

## 2018-12-05 DIAGNOSIS — M25572 Pain in left ankle and joints of left foot: Secondary | ICD-10-CM | POA: Diagnosis not present

## 2018-12-05 DIAGNOSIS — B351 Tinea unguium: Secondary | ICD-10-CM | POA: Diagnosis not present

## 2018-12-05 DIAGNOSIS — M25571 Pain in right ankle and joints of right foot: Secondary | ICD-10-CM | POA: Diagnosis not present

## 2018-12-05 DIAGNOSIS — M79671 Pain in right foot: Secondary | ICD-10-CM | POA: Diagnosis not present

## 2018-12-05 DIAGNOSIS — M722 Plantar fascial fibromatosis: Secondary | ICD-10-CM | POA: Diagnosis not present

## 2018-12-05 DIAGNOSIS — L600 Ingrowing nail: Secondary | ICD-10-CM | POA: Diagnosis not present

## 2018-12-05 DIAGNOSIS — M79672 Pain in left foot: Secondary | ICD-10-CM | POA: Diagnosis not present

## 2018-12-11 ENCOUNTER — Other Ambulatory Visit: Payer: Self-pay | Admitting: Internal Medicine

## 2018-12-11 DIAGNOSIS — E782 Mixed hyperlipidemia: Secondary | ICD-10-CM

## 2018-12-12 DIAGNOSIS — H25811 Combined forms of age-related cataract, right eye: Secondary | ICD-10-CM | POA: Diagnosis not present

## 2018-12-24 NOTE — Telephone Encounter (Signed)
Patient called and made aware she needs fasting lipid panel completed for new prior auth - needs renewal of amgen safety net application. She plans to come by this week. Application will be printed and left for pick up.

## 2018-12-26 ENCOUNTER — Other Ambulatory Visit: Payer: Self-pay

## 2018-12-26 DIAGNOSIS — E782 Mixed hyperlipidemia: Secondary | ICD-10-CM

## 2018-12-26 LAB — LIPID PANEL
Chol/HDL Ratio: 2.1 ratio (ref 0.0–4.4)
Cholesterol, Total: 181 mg/dL (ref 100–199)
HDL: 86 mg/dL (ref >39)
LDL Chol Calc (NIH): 76 mg/dL (ref 0–99)
Triglycerides: 107 mg/dL (ref 0–149)
VLDL Cholesterol Cal: 19 mg/dL (ref 5–40)

## 2018-12-27 NOTE — Telephone Encounter (Signed)
Attempted to renew PA for repatha on [email or web address] with [facility] part D plan Message received: "Information regarding your request. Available without authorization."

## 2018-12-31 DIAGNOSIS — H2511 Age-related nuclear cataract, right eye: Secondary | ICD-10-CM | POA: Diagnosis not present

## 2018-12-31 DIAGNOSIS — H25011 Cortical age-related cataract, right eye: Secondary | ICD-10-CM | POA: Diagnosis not present

## 2018-12-31 DIAGNOSIS — H25811 Combined forms of age-related cataract, right eye: Secondary | ICD-10-CM | POA: Diagnosis not present

## 2019-01-02 DIAGNOSIS — M79674 Pain in right toe(s): Secondary | ICD-10-CM | POA: Diagnosis not present

## 2019-01-02 DIAGNOSIS — B351 Tinea unguium: Secondary | ICD-10-CM | POA: Diagnosis not present

## 2019-01-02 DIAGNOSIS — M79675 Pain in left toe(s): Secondary | ICD-10-CM | POA: Diagnosis not present

## 2019-01-02 DIAGNOSIS — L600 Ingrowing nail: Secondary | ICD-10-CM | POA: Diagnosis not present

## 2019-01-07 DIAGNOSIS — F4310 Post-traumatic stress disorder, unspecified: Secondary | ICD-10-CM | POA: Diagnosis not present

## 2019-01-07 DIAGNOSIS — F411 Generalized anxiety disorder: Secondary | ICD-10-CM | POA: Diagnosis not present

## 2019-01-08 ENCOUNTER — Other Ambulatory Visit: Payer: Self-pay

## 2019-01-08 ENCOUNTER — Encounter: Payer: Self-pay | Admitting: Surgery

## 2019-01-08 ENCOUNTER — Ambulatory Visit: Payer: PPO | Admitting: Surgery

## 2019-01-08 VITALS — Ht 66.0 in | Wt 180.0 lb

## 2019-01-08 DIAGNOSIS — M25561 Pain in right knee: Secondary | ICD-10-CM

## 2019-01-08 DIAGNOSIS — M170 Bilateral primary osteoarthritis of knee: Secondary | ICD-10-CM | POA: Diagnosis not present

## 2019-01-08 DIAGNOSIS — G8929 Other chronic pain: Secondary | ICD-10-CM | POA: Diagnosis not present

## 2019-01-08 DIAGNOSIS — M25562 Pain in left knee: Secondary | ICD-10-CM

## 2019-01-08 MED ORDER — LIDOCAINE HCL 1 % IJ SOLN
3.0000 mL | INTRAMUSCULAR | Status: AC | PRN
  Administered 2019-01-08: 11:00:00 3 mL

## 2019-01-08 MED ORDER — HYALURONAN 88 MG/4ML IX SOSY
88.0000 mg | PREFILLED_SYRINGE | INTRA_ARTICULAR | Status: AC | PRN
  Administered 2019-01-08: 88 mg via INTRA_ARTICULAR

## 2019-01-08 MED ORDER — LIDOCAINE HCL 1 % IJ SOLN
3.0000 mL | INTRAMUSCULAR | Status: AC | PRN
  Administered 2019-01-08: 3 mL

## 2019-01-08 NOTE — Progress Notes (Signed)
Office Visit Note   Patient: Bonnie Juarez           Date of Birth: October 09, 1945           MRN: [id number] Visit Date: 01/08/2019              Requested by: Cari Caraway, [place],  [place] [address] PCP: Cari Caraway, MD   Assessment & Plan: Visit Diagnoses:  1. Bilateral primary osteoarthritis of knee   2. Chronic pain of both knees     Plan: After patient consent bilateral knees were prepped with Betadine and intra-articular Monovisc injections were performed.  Tolerated procedure well without complication.  Follow-up in 3 months for recheck with Dr. Inda Merlin to see how she is doing.  Again patient understands that ultimately will come down to needing definitive treatment with total knee replacements.  She is wanting to exhaust all conservative measures before going that route.  Follow-Up Instructions: Return in about 3 months (around 04/10/2019) for WITH DR YATES.   Orders:  No orders of the defined types were placed in this encounter.  No orders of the defined types were placed in this encounter.     Procedures: Large Joint Inj: bilateral knee on 01/08/2019 11:11 AM Indications: pain Details: 25 G 1.5 in needle, anteromedial approach Medications (Right): 3 mL lidocaine 1 %; 88 mg Hyaluronan 88 MG/4ML Medications (Left): 3 mL lidocaine 1 %; 88 mg Hyaluronan 88 MG/4ML Outcome: tolerated well, no immediate complications Consent was given by the patient. Patient was prepped and draped in the usual sterile fashion.       Clinical Data: No additional findings.   Subjective: Chief Complaint  Patient presents with  . Right Knee - Follow-up, Pain  . Left Knee - Follow-up, Pain    HPI 73 year old white female history of end-stage DJD bilateral knees comes in for Monovisc injections.  Previous intra-articular Marcaine/Depo-Medrol injection performed gave some temporary improvement.  She continues have ongoing pain in her knees with activity.  She  understands it will come down her needing total knee replacement but she is wanting to give this more time.   Objective: Vital Signs: Ht 5\' 6"  (1.676 m)   Wt 180 lb (81.6 kg)   BMI 29.05 kg/m   Physical Exam Gait is antalgic.  Very pleasant white female alert and oriented in no acute distress.  Bile knees good range of motion.  She has some swelling bilateral knees without large effusions.  Positive patellofemoral crepitus.  Joint line tender. Ortho Exam  Specialty Comments:  No specialty comments available.  Imaging: No results found.   PMFS History: Patient Active Problem List   Diagnosis Date Noted  . Plantar fasciitis of left foot 10/02/2018  . S/P lumbar fusion 02/06/2018  . Mixed dyslipidemia 11/13/2017  . Family history of heart disease 11/13/2017  . Statin intolerance 11/13/2017  . Bilateral primary osteoarthritis of knee 10/30/2017  . S/P cervical spinal fusion 12/20/2016  . Spondylosis without myelopathy or radiculopathy, lumbar region 10/05/2016  . Chronic bilateral low back pain without sciatica 10/05/2016  . Fibromyalgia 10/05/2016  . Closed fracture of right proximal humerus 01/08/2013    Class: Acute   Past Medical History:  Diagnosis Date  . Anxiety   . Arthritis   . Cancer ([place])    Hx: of squamouscell carcinoma on Left shin basal cell on face; melanoma on right lower leg  . Complication of anesthesia    December 23, 2003 deceased LOC and hypoxia  overmedicating with Morphine and Phenergan; received Narcan X 2, overnight pulse ox  . Depression   . Fibromyalgia   . Frequent urination   . GERD (gastroesophageal reflux disease)    Hx: of  . H/O hiatal hernia    PMH: of  . Headache(784.0)   . Hypertension   . IBS (irritable bowel syndrome)    Hx: of  . Osteopenia    Hx: of per pt, pt. adds osteoporosis- DOS  . Pneumonia    hx  . PONV (postoperative nausea and vomiting)   . Sleep apnea    Hx: of in the past-?mild apnea 10 yrs ago- followed by dr and none  found    Family History  Problem Relation Age of Onset  . Heart disease Other   . Lung disease Other     Past Surgical History:  Procedure Laterality Date  . APPENDECTOMY    . ARCUATE KERATECTOMY    . BREAST LUMPECTOMY Right 1991  . CERVICAL FUSION  1983  . FRACTURE SURGERY Right 2014   humerus  . HERNIA REPAIR     hiatal  . KNEE ARTHROSCOPY Right 2015  . [place] PLACATION  2010  . ORIF HUMERUS FRACTURE Right 01/08/2013   Procedure: OPEN REDUCTION INTERNAL FIXATION (ORIF) PROXIMAL HUMERUS FRACTURE;  Surgeon: Marybelle Killings, MD;  Location: [facility];  Service: Orthopedics;  Laterality: Right;  Open Reduction Internal Fixation Right Proximal Humerus Fracture  . POSTERIOR CERVICAL FUSION/FORAMINOTOMY N/A 12/20/2016   Procedure: Posterior Cervical Fusion with lateral mass fixation - Cervical Three-Thoracic Two;  Surgeon: Eustace Moore, MD;  Location: [facility];  Service: Neurosurgery;  Laterality: N/A;  . RADIAL HEAD REPLACEMENT  2005  . trigger thrumb release Left 2001  . tubular plasity  1970   Social History   Occupational History  . Not on file  Tobacco Use  . Smoking status: Former Smoker    Packs/day: 1.00    Years: 15.00    Pack years: 15.00    Types: Cigarettes    Quit date: 12/12/1980    Years since quitting: 38.0  . Smokeless tobacco: Never Used  . Tobacco comment: Quit 1982"  Substance and Sexual Activity  . Alcohol use: Yes    Alcohol/week: 7.0 - 10.0 standard drinks    Types: 7 - 10 Glasses of wine per week  . Drug use: No  . Sexual activity: Not on file

## 2019-01-20 ENCOUNTER — Telehealth: Payer: Self-pay | Admitting: Internal Medicine

## 2019-01-20 DIAGNOSIS — E782 Mixed hyperlipidemia: Secondary | ICD-10-CM | POA: Diagnosis not present

## 2019-01-20 DIAGNOSIS — F3341 Major depressive disorder, recurrent, in partial remission: Secondary | ICD-10-CM | POA: Diagnosis not present

## 2019-01-20 DIAGNOSIS — F341 Dysthymic disorder: Secondary | ICD-10-CM | POA: Diagnosis not present

## 2019-01-20 DIAGNOSIS — F3342 Major depressive disorder, recurrent, in full remission: Secondary | ICD-10-CM | POA: Diagnosis not present

## 2019-01-20 DIAGNOSIS — I10 Essential (primary) hypertension: Secondary | ICD-10-CM | POA: Diagnosis not present

## 2019-01-20 DIAGNOSIS — M810 Age-related osteoporosis without current pathological fracture: Secondary | ICD-10-CM | POA: Diagnosis not present

## 2019-01-20 NOTE — Telephone Encounter (Signed)
Spoke with patient and notified her she has been denied coverage for repatha assistance MyChart message sent with info on [email or web address]

## 2019-01-30 DIAGNOSIS — B351 Tinea unguium: Secondary | ICD-10-CM | POA: Diagnosis not present

## 2019-01-30 DIAGNOSIS — L600 Ingrowing nail: Secondary | ICD-10-CM | POA: Diagnosis not present

## 2019-01-30 DIAGNOSIS — M722 Plantar fascial fibromatosis: Secondary | ICD-10-CM | POA: Diagnosis not present

## 2019-01-30 DIAGNOSIS — M79671 Pain in right foot: Secondary | ICD-10-CM | POA: Diagnosis not present

## 2019-01-30 DIAGNOSIS — M79672 Pain in left foot: Secondary | ICD-10-CM | POA: Diagnosis not present

## 2019-02-04 DIAGNOSIS — H25012 Cortical age-related cataract, left eye: Secondary | ICD-10-CM | POA: Diagnosis not present

## 2019-02-04 DIAGNOSIS — H25812 Combined forms of age-related cataract, left eye: Secondary | ICD-10-CM | POA: Diagnosis not present

## 2019-02-04 DIAGNOSIS — H2512 Age-related nuclear cataract, left eye: Secondary | ICD-10-CM | POA: Diagnosis not present

## 2019-03-05 ENCOUNTER — Encounter: Payer: Self-pay | Admitting: Internal Medicine

## 2019-03-05 ENCOUNTER — Ambulatory Visit: Payer: PPO | Admitting: Internal Medicine

## 2019-03-05 ENCOUNTER — Telehealth: Payer: Self-pay | Admitting: Internal Medicine

## 2019-03-05 ENCOUNTER — Other Ambulatory Visit: Payer: Self-pay

## 2019-03-05 VITALS — BP 133/84 | HR 74 | Ht 66.0 in | Wt 184.2 lb

## 2019-03-05 DIAGNOSIS — E782 Mixed hyperlipidemia: Secondary | ICD-10-CM | POA: Diagnosis not present

## 2019-03-05 DIAGNOSIS — Z789 Other specified health status: Secondary | ICD-10-CM

## 2019-03-05 DIAGNOSIS — Z8249 Family history of ischemic heart disease and other diseases of the circulatory system: Secondary | ICD-10-CM | POA: Diagnosis not present

## 2019-03-05 DIAGNOSIS — R931 Abnormal findings on diagnostic imaging of heart and coronary circulation: Secondary | ICD-10-CM | POA: Diagnosis not present

## 2019-03-05 NOTE — Patient Instructions (Signed)
Medication Instructions:  NO CHANGE *If you need a refill on your cardiac medications before your next appointment, please call your pharmacy*  Lab Work: Your physician recommends that you return for lab work in: [place] If you have labs (blood work) drawn today and your tests are completely normal, you will receive your results only by: Marland Kitchen MyChart Message (if you have MyChart) OR . A paper copy in the mail If you have any lab test that is abnormal or we need to change your treatment, we will call you to review the results.  Follow-Up: At [facility], you and your health needs are our priority.  As part of our continuing mission to provide you with exceptional heart care, we have created designated Provider Care Teams.  These Care Teams include your primary Cardiologist (physician) and Advanced Practice Providers (APPs -  Physician Assistants and Nurse Practitioners) who all work together to provide you with the care you need, when you need it.  Your next appointment:   12 month(s)  The format for your next appointment:   Either In Person or Virtual  Provider:   Raliegh Ip Mali Hilty, MD

## 2019-03-05 NOTE — Progress Notes (Signed)
OFFICE CONSULT NOTE  Chief Complaint:  Follow-up dyslipidemia  Primary Care Physician: Cari Caraway, MD  HPI:  Bonnie Juarez is a 74 y.o. female who is being seen today for the evaluation of dyslipidemia at the request of Cari Caraway, MD. This is a pleasant 74 year old female patient is kindly referred to me for evaluation of dyslipidemia.  She has a strong family history of heart disease mostly in her mother who had 2 stents at age 67 and a stroke in her 29s.  There is also history of heart failure diabetes and possible heart attack in her siblings.  Personal medical problems include fibromyalgia and problems with her cervical spine which is been operated on, dyslipidemia, hypertension and history of melanoma.  She currently takes rosuvastatin 10 mg once weekly which is the most medicine she can tolerate.  In the past she is taken atorvastatin which caused significant myalgias as well as pravastatin which she tolerated however did not have significant improvement in her dyslipidemia.  She says this significantly affects her fibromyalgia.  Based on these numbers are most recent lipid profile in August showed total cholesterol 250, triglycerides 103, HDL 76 and LDL 154.  Although she has no known coronary disease, given her age, hypertension and family history of coronary disease, her goal LDL is likely less than 100.  At this point she is unlikely to reach that with additional therapies other than likely a PCSK9 inhibitor.  She reports being asymptomatic as far as no chest pain or worsening shortness of breath.  03/18/2018  Bonnie Juarez is seen today in follow-up.  She is doing very well.  She had spinal surgery in December.  She was having symptoms of sciatica in both legs which is improved significantly.  She is exercising more regularly and has less generalized pain.  She is tolerating the Repatha without any the side effects that she had previously on statins.  Fortunately she has had marked  reduction in her lipid profile.  Total cholesterol 4 days ago was 153, triglycerides 101, HDL 73 and LDL of 60.  This is a marked improvement in her lipid profile will certainly reduce her risk.  She did have a coronary calcium score in September which showed low calcium score of 80, however there was scattered coronary calcifications and moderate diffuse calcifications of the aortic root and descending aorta.  03/05/2019  Bonnie Juarez is seen today in annual follow-up.  Overall she reports doing really well.  Unfortunately she was not able to get her Repatha reauthorized.  Is not clear why this was denied and recently she tried to pick up more medications but was told that she needed new reauthorization.  Previously however it was reported that she did not need prior authorization.  We will look into this further.  She is also concerned about cost of medications and I advised her to consider the health well foundation.  We will provide her with an additional sample of Repatha today as she took her last dose 2 weeks ago and has no further doses.  Anticipate will take at least a couple weeks to do the paperwork.  Her most recent lipid profile was in October 2020 which showed total cholesterol 181, HDL 86, LDL 76 and triglycerides 107.  PMHx:  Past Medical History:  Diagnosis Date  . Anxiety   . Arthritis   . Cancer ([place])    Hx: of squamouscell carcinoma on Left shin basal cell on face; melanoma on right lower leg  .  Complication of anesthesia    November 24, 2003 deceased LOC and hypoxia overmedicating with Morphine and Phenergan; received Narcan X 2, overnight pulse ox  . Depression   . Fibromyalgia   . Frequent urination   . GERD (gastroesophageal reflux disease)    Hx: of  . H/O hiatal hernia    PMH: of  . Headache(784.0)   . Hypertension   . IBS (irritable bowel syndrome)    Hx: of  . Osteopenia    Hx: of per pt, pt. adds osteoporosis- DOS  . Pneumonia    hx  . PONV (postoperative nausea and  vomiting)   . Sleep apnea    Hx: of in the past-?mild apnea 10 yrs ago- followed by dr and none found    Past Surgical History:  Procedure Laterality Date  . APPENDECTOMY    . ARCUATE KERATECTOMY    . BREAST LUMPECTOMY Right 1991  . CERVICAL FUSION  1983  . FRACTURE SURGERY Right 2014   humerus  . HERNIA REPAIR     hiatal  . KNEE ARTHROSCOPY Right 2015  . [place] PLACATION  2010  . ORIF HUMERUS FRACTURE Right 01/08/2013   Procedure: OPEN REDUCTION INTERNAL FIXATION (ORIF) PROXIMAL HUMERUS FRACTURE;  Surgeon: Marybelle Killings, MD;  Location: [facility];  Service: Orthopedics;  Laterality: Right;  Open Reduction Internal Fixation Right Proximal Humerus Fracture  . POSTERIOR CERVICAL FUSION/FORAMINOTOMY N/A 12/20/2016   Procedure: Posterior Cervical Fusion with lateral mass fixation - Cervical Three-Thoracic Two;  Surgeon: Eustace Moore, MD;  Location: [facility];  Service: Neurosurgery;  Laterality: N/A;  . RADIAL HEAD REPLACEMENT  2005  . trigger thrumb release Left 2001  . tubular plasity  1970    FAMHx:  Family History  Problem Relation Age of Onset  . Heart disease Other   . Lung disease Other     SOCHx:   reports that she quit smoking about 38 years ago. Her smoking use included cigarettes. She has a 15.00 pack-year smoking history. She has never used smokeless tobacco. She reports current alcohol use of about 7.0 - 10.0 standard drinks of alcohol per week. She reports that she does not use drugs.  ALLERGIES:  Allergies  Allergen Reactions  . Boniva [Ibandronic Acid] Other (See Comments)    Bone Pain  . Dilaudid [Hydromorphone Hcl] Other (See Comments)    Hallucinations  . Risedronate Sodium Other (See Comments)    Bone pain    ROS: Pertinent items noted in HPI and remainder of comprehensive ROS otherwise negative.  HOME MEDS: Current Outpatient Medications on File Prior to Visit  Medication Sig Dispense Refill  . Cholecalciferol (VITAMIN D) 50 MCG (2000 UT) tablet Take  2,000 Units by mouth every morning.     . Coenzyme Q10 (COQ10) 100 MG CAPS Take 100 mg by mouth 2 (two) times a week.     . conjugated estrogens (PREMARIN) vaginal cream Place 1 g vaginally once a week.     . escitalopram (LEXAPRO) 20 MG tablet Take 20 mg by mouth daily.   2  . Evolocumab (REPATHA) 140 MG/ML SOSY Inject 140 mg into the skin every 14 (fourteen) days.    . fluticasone (FLONASE) 50 MCG/ACT nasal spray Place 2 sprays into the nose daily as needed for allergies or rhinitis.     Marland Kitchen losartan (COZAAR) 50 MG tablet Take 50 mg by mouth daily.    Marland Kitchen OVER THE COUNTER MEDICATION Take 1 capsule by mouth daily as needed (pain). CBD OIL  Caps 25 mg per capsule     . traZODone (DESYREL) 100 MG tablet Take 100 mg by mouth at bedtime.   2  . vitamin B-12 (CYANOCOBALAMIN) 1000 MCG tablet Take 2,000 mcg by mouth every morning.     No current facility-administered medications on file prior to visit.    LABS/IMAGING: No results found for this or any previous visit (from the past 48 hour(s)). No results found.  LIPID PANEL:    Component Value Date/Time   CHOL 181 12/26/2018 1038   TRIG 107 12/26/2018 1038   HDL 86 12/26/2018 1038   CHOLHDL 2.1 12/26/2018 1038   LDLCALC 76 12/26/2018 1038    WEIGHTS: Wt Readings from Last 3 Encounters:  03/05/19 184 lb 3.2 oz (83.6 kg)  01/08/19 180 lb (81.6 kg)  11/07/18 180 lb (81.6 kg)    VITALS: BP 133/84   Pulse 74   Ht 5\' 6"  (1.676 m)   Wt 184 lb 3.2 oz (83.6 kg)   SpO2 94%   BMI 29.73 kg/m   EXAM: Deferred  EKG: Deferred  ASSESSMENT: 1. Mixed dyslipidemia  2. Mild coronary artery calcification-CAC 80 (10/2017) 3. Strong family history of coronary disease 4. Hypertension 5. Statin intolerance-on Repatha  PLAN: 1.   Bonnie Juarez continues to do well on Repatha at near target LDL less than 70.  I would recommend reauthorization the medicine for continued therapy to reduce cardiovascular risk.  She has responded well and is near target.   No other changes to her medicines today.  Follow-up with me annually or sooner as necessary.  Pixie Casino, MD, [facility], [place] Director of the [facility] Lipid Disorders &  Cardiovascular Risk Reduction Clinic Diplomate of the American Board of Clinical Lipidology Attending Cardiologist  Direct Dial: [phone]  Fax: [phone]  Website:  [email or web address] Shimon 03/05/2019, 11:14 AM

## 2019-03-05 NOTE — Telephone Encounter (Signed)
Per office note 03/05/2019, patient has not been able to obtain Repatha Rx. Per chart review, a prior Josem Kaufmann submission was made Oct 2020 and not auth needed then. Attempted new PA on CMM - submitted today 03/05/2019   (Key: [id number])

## 2019-03-06 NOTE — Telephone Encounter (Signed)
PA Case: [id number], Status: Approved, Coverage Starts on: 03/05/2019 12:00:00 AM, Coverage Ends on: 03/04/2020 12:00:00 AM

## 2019-03-18 ENCOUNTER — Other Ambulatory Visit: Payer: Self-pay | Admitting: Internal Medicine

## 2019-03-18 NOTE — Telephone Encounter (Signed)
*  STAT* If patient is at the pharmacy, call can be transferred to refill team.   1. Which medications need to be refilled? (please list name of each medication and dose if known) Evolocumab (REPATHA) 140 MG/ML SOSY  2. Which pharmacy/location (including street and city if local pharmacy) is medication to be sent to?[id number] - [place], [place] - [address]  3. Do they need a 30 day or 90 day supply? 90 day supply

## 2019-03-21 MED ORDER — REPATHA 140 MG/ML ~~LOC~~ SOSY: 140.0000 mg | PREFILLED_SYRINGE | SUBCUTANEOUS | 3 refills | Status: DC

## 2019-03-24 DIAGNOSIS — M810 Age-related osteoporosis without current pathological fracture: Secondary | ICD-10-CM | POA: Diagnosis not present

## 2019-03-24 DIAGNOSIS — E782 Mixed hyperlipidemia: Secondary | ICD-10-CM | POA: Diagnosis not present

## 2019-03-24 DIAGNOSIS — I10 Essential (primary) hypertension: Secondary | ICD-10-CM | POA: Diagnosis not present

## 2019-03-24 DIAGNOSIS — F3342 Major depressive disorder, recurrent, in full remission: Secondary | ICD-10-CM | POA: Diagnosis not present

## 2019-03-24 DIAGNOSIS — F3341 Major depressive disorder, recurrent, in partial remission: Secondary | ICD-10-CM | POA: Diagnosis not present

## 2019-03-24 DIAGNOSIS — F341 Dysthymic disorder: Secondary | ICD-10-CM | POA: Diagnosis not present

## 2019-04-11 ENCOUNTER — Other Ambulatory Visit: Payer: Self-pay

## 2019-04-11 ENCOUNTER — Encounter: Payer: Self-pay | Admitting: Orthopaedic Surgery

## 2019-04-11 ENCOUNTER — Ambulatory Visit (INDEPENDENT_AMBULATORY_CARE_PROVIDER_SITE_OTHER): Payer: PPO | Admitting: Orthopaedic Surgery

## 2019-04-11 VITALS — BP 123/71 | HR 75 | Ht 66.0 in | Wt 184.0 lb

## 2019-04-11 DIAGNOSIS — M722 Plantar fascial fibromatosis: Secondary | ICD-10-CM

## 2019-04-11 DIAGNOSIS — M170 Bilateral primary osteoarthritis of knee: Secondary | ICD-10-CM | POA: Diagnosis not present

## 2019-04-11 DIAGNOSIS — G8929 Other chronic pain: Secondary | ICD-10-CM | POA: Diagnosis not present

## 2019-04-11 DIAGNOSIS — M545 Low back pain, unspecified: Secondary | ICD-10-CM

## 2019-04-11 NOTE — Progress Notes (Signed)
Office Visit Note   Patient: Bonnie Juarez           Date of Birth: 09-20-1945           MRN: [id number] Visit Date: 04/11/2019              Requested by: Cari Caraway, [place],  [place] [address] PCP: Cari Caraway, MD   Assessment & Plan: Visit Diagnoses:  1. Plantar fasciitis of left foot   2. Chronic bilateral low back pain, unspecified whether sciatica present   3. Bilateral primary osteoarthritis of knee   4. Chronic bilateral low back pain without sciatica     Plan: We will set patient up for some physical therapy for treatment of her lumbar spine as well as plantar fasciitis.  She can return in 1 month for recheck.  Follow-Up Instructions: Return in about 1 month (around 05/09/2019).   Orders:  Orders Placed This Encounter  Procedures  . Ambulatory referral to Physical Therapy   No orders of the defined types were placed in this encounter.     Procedures: No procedures performed   Clinical Data: No additional findings.   Subjective: Chief Complaint  Patient presents with  . Left Knee - Pain, Follow-up  . Right Knee - Pain, Follow-up    HPI  74 year old female returns with ongoing bilateral knee pain.  She also has some problems with left plantar fasciitis and also ongoing low back pain.  She is requesting therapy referral for treatment of her back symptoms.  Previous injection with Monovisc done in November gave her significant improvement in her knee symptoms.  Previous x-rays September 2020 showed mild to moderate osteoarthritis with lateral spurring both knees.  Patient's been using some CBD oil on her knees as well as her plantar fascia.  She had previous posterior lumbar fusion by Dr. Sherley Bounds in 2019.  She has been doing a frozen orange can or lemonade can for her heel plantar fascial stretching.  She denies any locking.  Intermittent knee swelling particularly with increased activities.  Review of Systems 14 point system update  unchanged from 10/30/2017 office note other than as mentioned above.   Objective: Vital Signs: BP 123/71   Pulse 75   Ht 5\' 6"  (1.676 m)   Wt 184 lb (83.5 kg)   BMI 29.70 kg/m   Physical Exam Constitutional:      Appearance: She is well-developed.  HENT:     Head: Normocephalic.     Right Ear: External ear normal.     Left Ear: External ear normal.  Eyes:     Pupils: Pupils are equal, round, and reactive to light.  Neck:     Thyroid: No thyromegaly.     Trachea: No tracheal deviation.  Cardiovascular:     Rate and Rhythm: Normal rate.  Pulmonary:     Effort: Pulmonary effort is normal.  Abdominal:     Palpations: Abdomen is soft.  Skin:    General: Skin is warm and dry.  Neurological:     Mental Status: She is alert and oriented to person, place, and time.  Psychiatric:        Behavior: Behavior normal.     Ortho Exam well-healed lumbar incision.  Mild sciatic notch tenderness.  Anterior tib gastrocsoleus is intact.  She is able to heel and toe walk she has crepitus with knee flexion extension trace effusion right left more lateral than medial joint line tenderness crepitus with  patellofemoral loading and quadriceps contracture.  No pain with hip range of motion.  Specialty Comments:  No specialty comments available.  Imaging: No results found.   PMFS History: Patient Active Problem List   Diagnosis Date Noted  . Plantar fasciitis of left foot 10/02/2018  . S/P lumbar fusion 02/06/2018  . Mixed dyslipidemia 11/13/2017  . Family history of heart disease 11/13/2017  . Statin intolerance 11/13/2017  . Bilateral primary osteoarthritis of knee 10/30/2017  . S/P cervical spinal fusion 12/20/2016  . Spondylosis without myelopathy or radiculopathy, lumbar region 10/05/2016  . Chronic bilateral low back pain without sciatica 10/05/2016  . Fibromyalgia 10/05/2016  . Closed fracture of right proximal humerus 01/08/2013    Class: Acute   Past Medical History:   Diagnosis Date  . Anxiety   . Arthritis   . Cancer ([place])    Hx: of squamouscell carcinoma on Left shin basal cell on face; melanoma on right lower leg  . Complication of anesthesia    12-14-2003 deceased LOC and hypoxia overmedicating with Morphine and Phenergan; received Narcan X 2, overnight pulse ox  . Depression   . Fibromyalgia   . Frequent urination   . GERD (gastroesophageal reflux disease)    Hx: of  . H/O hiatal hernia    PMH: of  . Headache(784.0)   . Hypertension   . IBS (irritable bowel syndrome)    Hx: of  . Osteopenia    Hx: of per pt, pt. adds osteoporosis- DOS  . Pneumonia    hx  . PONV (postoperative nausea and vomiting)   . Sleep apnea    Hx: of in the past-?mild apnea 10 yrs ago- followed by dr and none found    Family History  Problem Relation Age of Onset  . Heart disease Other   . Lung disease Other     Past Surgical History:  Procedure Laterality Date  . APPENDECTOMY    . ARCUATE KERATECTOMY    . BREAST LUMPECTOMY Right 1991  . CERVICAL FUSION  1983  . FRACTURE SURGERY Right 2014   humerus  . HERNIA REPAIR     hiatal  . KNEE ARTHROSCOPY Right 2015  . [place] PLACATION  2010  . ORIF HUMERUS FRACTURE Right 01/08/2013   Procedure: OPEN REDUCTION INTERNAL FIXATION (ORIF) PROXIMAL HUMERUS FRACTURE;  Surgeon: Marybelle Killings, MD;  Location: [facility];  Service: Orthopedics;  Laterality: Right;  Open Reduction Internal Fixation Right Proximal Humerus Fracture  . POSTERIOR CERVICAL FUSION/FORAMINOTOMY N/A 12/20/2016   Procedure: Posterior Cervical Fusion with lateral mass fixation - Cervical Three-Thoracic Two;  Surgeon: Eustace Moore, MD;  Location: [facility];  Service: Neurosurgery;  Laterality: N/A;  . RADIAL HEAD REPLACEMENT  2005  . trigger thrumb release Left 2001  . tubular plasity  1970   Social History   Occupational History  . Not on file  Tobacco Use  . Smoking status: Former Smoker    Packs/day: 1.00    Years: 15.00    Pack years: 15.00     Types: Cigarettes    Quit date: 12/12/1980    Years since quitting: 38.3  . Smokeless tobacco: Never Used  . Tobacco comment: Quit 1982"  Substance and Sexual Activity  . Alcohol use: Yes    Alcohol/week: 7.0 - 10.0 standard drinks    Types: 7 - 10 Glasses of wine per week  . Drug use: No  . Sexual activity: Not on file

## 2019-04-17 ENCOUNTER — Ambulatory Visit: Payer: PPO | Admitting: Rehabilitative and Restorative Service Providers"

## 2019-04-20 ENCOUNTER — Ambulatory Visit: Payer: PPO | Attending: Internal Medicine

## 2019-04-20 DIAGNOSIS — Z23 Encounter for immunization: Secondary | ICD-10-CM | POA: Insufficient documentation

## 2019-04-20 NOTE — Progress Notes (Signed)
   Covid-19 Vaccination Clinic  Name:  Bonnie Juarez    MRN: [id number] DOB: 11/28/1945  04/20/2019  [name] was observed post Covid-19 immunization for 15 minutes without incidence. She was provided with Vaccine Information Sheet and instruction to access the V-Safe system.   Ms. Signs was instructed to call 911 with any severe reactions post vaccine: Marland Kitchen Difficulty breathing  . Swelling of your face and throat  . A fast heartbeat  . A bad rash all over your body  . Dizziness and weakness    Immunizations Administered    Name Date Dose VIS Date Route   Pfizer COVID-19 Vaccine 04/20/2019 10:29 AM 0.3 mL 02/07/2019 Intramuscular   Manufacturer: [place]   Lot: [id number]   [place]: [id number]

## 2019-04-21 ENCOUNTER — Other Ambulatory Visit: Payer: Self-pay

## 2019-04-21 ENCOUNTER — Encounter: Payer: Self-pay | Admitting: Physical Therapy

## 2019-04-21 ENCOUNTER — Ambulatory Visit (INDEPENDENT_AMBULATORY_CARE_PROVIDER_SITE_OTHER): Payer: PPO | Admitting: Physical Therapy

## 2019-04-21 DIAGNOSIS — M25562 Pain in left knee: Secondary | ICD-10-CM

## 2019-04-21 DIAGNOSIS — R2689 Other abnormalities of gait and mobility: Secondary | ICD-10-CM

## 2019-04-21 DIAGNOSIS — G8929 Other chronic pain: Secondary | ICD-10-CM | POA: Diagnosis not present

## 2019-04-21 DIAGNOSIS — M79672 Pain in left foot: Secondary | ICD-10-CM

## 2019-04-21 DIAGNOSIS — M545 Low back pain, unspecified: Secondary | ICD-10-CM

## 2019-04-21 DIAGNOSIS — M25561 Pain in right knee: Secondary | ICD-10-CM | POA: Diagnosis not present

## 2019-04-21 DIAGNOSIS — M79671 Pain in right foot: Secondary | ICD-10-CM | POA: Diagnosis not present

## 2019-04-21 NOTE — Therapy (Signed)
[place], [place], [address] Phone: [phone]   Fax:  [phone]  Physical Therapy Evaluation  Patient Details  Name: Bonnie Juarez MRN: [id number] Date of Birth: 07-23-1945 Referring Provider (PT): Marybelle Killings, MD   Encounter Date: 04/21/2019  PT End of Session - 04/21/19 1129    Visit Number  1    Number of Visits  12    Date for PT Re-Evaluation  06/02/19    PT Start Time  [id number]    PT Stop Time  1115    PT Time Calculation (min)  60 min    Activity Tolerance  Patient tolerated treatment well    Behavior During Therapy  [facility] for tasks assessed/performed       Past Medical History:  Diagnosis Date  . Anxiety   . Arthritis   . Cancer ([place])    Hx: of squamouscell carcinoma on Left shin basal cell on face; melanoma on right lower leg  . Complication of anesthesia    2003-12-01 deceased LOC and hypoxia overmedicating with Morphine and Phenergan; received Narcan X 2, overnight pulse ox  . Depression   . Fibromyalgia   . Frequent urination   . GERD (gastroesophageal reflux disease)    Hx: of  . H/O hiatal hernia    PMH: of  . Headache(784.0)   . Hypertension   . IBS (irritable bowel syndrome)    Hx: of  . Osteopenia    Hx: of per pt, pt. adds osteoporosis- DOS  . Pneumonia    hx  . PONV (postoperative nausea and vomiting)   . Sleep apnea    Hx: of in the past-?mild apnea 10 yrs ago- followed by dr and none found    Past Surgical History:  Procedure Laterality Date  . APPENDECTOMY    . ARCUATE KERATECTOMY    . BREAST LUMPECTOMY Right 1991  . CERVICAL FUSION  1983  . FRACTURE SURGERY Right 2014   humerus  . HERNIA REPAIR     hiatal  . KNEE ARTHROSCOPY Right 2015  . [place] PLACATION  2010  . ORIF HUMERUS FRACTURE Right 01/08/2013   Procedure: OPEN REDUCTION INTERNAL FIXATION (ORIF) PROXIMAL HUMERUS FRACTURE;  Surgeon: Marybelle Killings, MD;  Location: [place];  Service: Orthopedics;  Laterality: Right;   Open Reduction Internal Fixation Right Proximal Humerus Fracture  . POSTERIOR CERVICAL FUSION/FORAMINOTOMY N/A 12/20/2016   Procedure: Posterior Cervical Fusion with lateral mass fixation - Cervical Three-Thoracic Two;  Surgeon: Eustace Moore, MD;  Location: [place];  Service: Neurosurgery;  Laterality: N/A;  . RADIAL HEAD REPLACEMENT  2005  . trigger thrumb release Left 2001  . tubular plasity  1970    There were no vitals filed for this visit.   Subjective Assessment - 04/21/19 1018    Subjective  she relays bilat foot plantar fasciitis, chronic LBP. knee OA. She had previous posterior lumbar fusion L4/5 by Dr. Sherley Bounds in 2019. She was walking a lot after back surgery because she was not having back pain but then she started having plantar fasciits and knee pain and now back pain has returned.    Pertinent History  PMH: lumbar and cervical fusion, fibromyalgia,knee OA    Limitations  Walking;Standing;House hold activities    How long can you stand comfortably?  depends    How long can you walk comfortably?  depends    Diagnostic tests  XR show previous lumbar fusion, and knee OA  Patient Stated Goals  reduce the pain    Currently in Pain?  Yes    Pain Score  3    in back, feet, knees   Pain Location  Back   knees, and feet   Pain Orientation  Right;Left    Pain Descriptors / Indicators  Aching    Pain Type  Chronic pain    Pain Radiating Towards  back, both knees, both feet, denies radiculopathy    Pain Onset  More than a month ago    Pain Frequency  Intermittent    Aggravating Factors   standing and walking    Pain Relieving Factors  heat, rest    Multiple Pain Sites  No         [facility] PT Assessment - 04/21/19 0001      Assessment   Medical Diagnosis  bilat foot plantar fasciitis, chronic LBP. knee OA    Referring Provider (PT)  Marybelle Killings, MD    Onset Date/Surgical Date  --   2 year onset of pain since lumbar fusion in 2019   Next MD Visit  nothing scheduled     Prior Therapy  no PT since march 2020      Precautions   Precautions  None      Balance Screen   Has the patient fallen in the past 6 months  No      [place] residence      Prior Function   Level of Independence  Independent    Vocation  Retired    Leisure  walking, pool, Librarian, academic   Overall Cognitive Status  Within Functional Limits for tasks assessed      Sensation   Light Touch  Appears Intact      Coordination   Gross Motor Movements are Fluid and Coordinated  Yes      Posture/Postural Control   Posture Comments  scoliosis, Rt knee valgus, high arches, Leg length descrepency      ROM / Strength   AROM / PROM / Strength  AROM;Strength      AROM   Overall AROM Comments  knee ROM WNL bilat    AROM Assessment Site  Lumbar;Ankle;Knee    Right/Left Knee  --    Right/Left Ankle  Right;Left    Right Ankle Dorsiflexion  --   to neutral   Left Ankle Dorsiflexion  --   to neutral   Lumbar Flexion  50%    Lumbar Extension  WFL    Lumbar - Right Side Bend  50%    Lumbar - Left Side Bend  50%    Lumbar - Right Rotation  50%    Lumbar - Left Rotation  50%      Strength   Overall Strength Comments  hip strength overall 4-/5 MMT bilat, knee strength 4+/5 MMT bilat, ankle DF 4/5 bilat ankle PF 4+/5 bilat      Flexibility   Soft Tissue Assessment /Muscle Length  --   tight heelcords, hip rotators, lumbar P.S.     Palpation   Palpation comment  TTP plantar fasica, lumbar P.S.      Special Tests   Other special tests  neg SLR, + Fabers test, direction preference inconclusive as she felt a litte better with both repeated flexion and extension, some relief with long axis distractions      Transfers   Transfers  Independent with all Transfers  Ambulation/Gait   Gait Comments  ambulates without AD, mild unsteadiness, feet into ER due to DF limitation      Standardized Balance Assessment   Standardized Balance  Assessment  Dynamic Gait Index      Functional Gait  Assessment   Gait assessed   --    Gait Level Surface  Walks 20 ft in less than 5.5 sec, no assistive devices, good speed, no evidence for imbalance, normal gait pattern, deviates no more than 6 in outside of the 12 in walkway width.    Change in Gait Speed  Able to smoothly change walking speed without loss of balance or gait deviation. Deviate no more than 6 in outside of the 12 in walkway width.    Gait with Horizontal Head Turns  Performs head turns with moderate changes in gait velocity, slows down, deviates 10-15 in outside 12 in walkway width but recovers, can continue to walk.    Gait with Vertical Head Turns  Performs task with moderate change in gait velocity, slows down, deviates 10-15 in outside 12 in walkway width but recovers, can continue to walk.    Gait and Pivot Turn  Pivot turns safely within 3 sec and stops quickly with no loss of balance.    Step Over Obstacle  Is able to step over 2 stacked shoe boxes taped together (9 in total height) without changing gait speed. No evidence of imbalance.    Gait with Narrow Base of Support  Ambulates 4-7 steps.    Gait with Eyes Closed  Walks 20 ft, slow speed, abnormal gait pattern, evidence for imbalance, deviates 10-15 in outside 12 in walkway width. Requires more than 9 sec to ambulate 20 ft.    Ambulating Backwards  Walks 20 ft, uses assistive device, slower speed, mild gait deviations, deviates 6-10 in outside 12 in walkway width.    Steps  Alternating feet, must use rail.    Total Score  20                Objective measurements completed on examination: See above findings.              PT Education - 04/21/19 1129    Education Details  HEP, POC    Person(s) Educated  Patient    Methods  Explanation;Demonstration;Verbal cues;Handout    Comprehension  Verbalized understanding;Need further instruction          PT Long Term Goals - 04/21/19 1135       PT LONG TERM GOAL #1   Title  Pt will be I and compliant with HEP and verbalize plan for continued exercise post PT discharge. (Target goal for all goals 6 weeks 4/521)    Status  New      PT LONG TERM GOAL #2   Title  Pt will improve bilat leg strength grossly to at least 4+/5 MMT to improve function    Status  New      PT LONG TERM GOAL #3   Title  Pt will improve FGA to 24 or greater to show improved dynamic balance    Status  New      PT LONG TERM GOAL #4   Title  Pt will report overall less than 4/10 pain with usual activity including walking and standing up to 30 minutes    Status  New             Plan - 04/21/19 1130    Clinical Impression Statement  Pt presents with bilat foot plantar fasciitis, chronic LBP. knee OA, balance. She had previous posterior lumbar fusion L4/5 by Dr. Sherley Bounds in 2019. She has overall decreased lumbar ROM, decreased ankle dorsiflexion bilat, decreased LE strength, postural dysfunction, mild gait unsteadiness, and increased pain paticularly with standing or walking activities. She will benefit from skiled PT to address her deficits.    Personal Factors and Comorbidities  Comorbidity 2    Comorbidities  PMH: lumbar and cervical fusion, fibromyalgia,knee OA    Examination-Activity Limitations  Lift;Stand;Stairs;Squat;Sleep;Carry;Locomotion Level    Examination-Participation Restrictions  Cleaning;Community Activity;Laundry;Shop    Stability/Clinical Decision Making  Evolving/Moderate complexity    Clinical Decision Making  Moderate    Rehab Potential  Good    PT Frequency  2x / week    PT Duration  6 weeks    PT Treatment/Interventions  ADLs/Self Care Home Management;Aquatic Therapy;Cryotherapy;English as a second language teacher;Therapeutic activities;Therapeutic exercise;Balance training;Neuromuscular re-education;Patient/family education;Orthotic Fit/Training;Manual techniques;Dry needling;Passive range of  motion;Taping    PT Next Visit Plan  review and update HEP PRN. consider modalaties and manual for pain, may benefit from aquatic if land PT does not help. needs bilat heel cord stretching, lumbar stretching, lumbar/LE strength, dynamic balance    PT Home Exercise Plan  Access Code: [email or web address]    Consulted and Agree with Plan of Care  Patient       Patient will benefit from skilled therapeutic intervention in order to improve the following deficits and impairments:  Abnormal gait, Decreased activity tolerance, Decreased balance, Decreased endurance, Decreased mobility, Decreased range of motion, Decreased strength, Difficulty walking, Increased fascial restricitons, Impaired flexibility, Postural dysfunction, Pain  Visit Diagnosis: Chronic bilateral low back pain without sciatica  Pain in right foot  Pain in left foot  Chronic pain of right knee  Chronic pain of left knee  Other abnormalities of gait and mobility     Problem List Patient Active Problem List   Diagnosis Date Noted  . Plantar fasciitis of left foot 10/02/2018  . S/P lumbar fusion 02/06/2018  . Mixed dyslipidemia 11/13/2017  . Family history of heart disease 11/13/2017  . Statin intolerance 11/13/2017  . Bilateral primary osteoarthritis of knee 10/30/2017  . S/P cervical spinal fusion 12/20/2016  . Spondylosis without myelopathy or radiculopathy, lumbar region 10/05/2016  . Chronic bilateral low back pain without sciatica 10/05/2016  . Fibromyalgia 10/05/2016  . Closed fracture of right proximal humerus 01/08/2013    Class: Acute    Silvestre Mesi 04/21/2019, 11:42 AM  [facility] Physical Therapy [address] [place], [place], [address] Phone: [phone]   Fax:  [phone]  Name: Bonnie Juarez MRN: [id number] Date of Birth: 1945-04-21

## 2019-04-21 NOTE — Patient Instructions (Signed)
Access Code: [email or web address]  URL: [email or web address]  Date: 04/21/2019  Prepared by: Elsie Ra   Exercises  Gastroc Stretch on Wall - 3 reps - 1 sets - 30 hold - 1x daily - 3x weekly  Soleus Stretch on Wall - 3 sets - 30 hold - 2x daily - 6x weekly  Standing 'L' Stretch at [employer] - 10 reps - 1 sets - 5 hold - 2x daily - 6x weekly  Prone Press Up on Elbows - 10 reps - 2-3 sets - 5 hold - 2x daily - 6x weekly  Hooklying Single Knee to Chest - 3 reps - 1 sets - 30 hold - 2x daily - 6x weekly  Supine Lower Trunk Rotation - 10 reps - 1 sets - 5 sec hold - 2x daily - 6x weekly  Supine Bridge - 10 reps - 1-2 sets - 5 hold - 2x daily - 6x weekly  Supine Active Straight Leg Raise - 10 reps - 1-2 sets - 2x daily - 6x weekly  Clamshell - 10 reps - 2-3 sets - 2x daily - 6x weekly  Heel Toe Raises with Counter Support - 10 reps - 2 sets - 2x daily - 6x weekly  Standing Tandem Balance with Counter Support - 3 reps - 1 sets - 30 hold - 2x daily - 6x weekly  Walking with Head Rotation - 3 reps - 2x daily - 6x weekly  Walking with Head Nod - 3 reps - 2x daily - 6x weekly

## 2019-04-23 DIAGNOSIS — E782 Mixed hyperlipidemia: Secondary | ICD-10-CM | POA: Diagnosis not present

## 2019-04-23 DIAGNOSIS — I700 Atherosclerosis of aorta: Secondary | ICD-10-CM | POA: Diagnosis not present

## 2019-04-23 DIAGNOSIS — F419 Anxiety disorder, unspecified: Secondary | ICD-10-CM | POA: Diagnosis not present

## 2019-04-23 DIAGNOSIS — Z0000 Encounter for general adult medical examination without abnormal findings: Secondary | ICD-10-CM | POA: Diagnosis not present

## 2019-04-23 DIAGNOSIS — M797 Fibromyalgia: Secondary | ICD-10-CM | POA: Diagnosis not present

## 2019-04-23 DIAGNOSIS — I10 Essential (primary) hypertension: Secondary | ICD-10-CM | POA: Diagnosis not present

## 2019-04-23 DIAGNOSIS — F3341 Major depressive disorder, recurrent, in partial remission: Secondary | ICD-10-CM | POA: Diagnosis not present

## 2019-04-23 DIAGNOSIS — F3342 Major depressive disorder, recurrent, in full remission: Secondary | ICD-10-CM | POA: Diagnosis not present

## 2019-04-23 DIAGNOSIS — Z23 Encounter for immunization: Secondary | ICD-10-CM | POA: Diagnosis not present

## 2019-04-23 DIAGNOSIS — K219 Gastro-esophageal reflux disease without esophagitis: Secondary | ICD-10-CM | POA: Diagnosis not present

## 2019-04-23 DIAGNOSIS — R7309 Other abnormal glucose: Secondary | ICD-10-CM | POA: Diagnosis not present

## 2019-04-23 DIAGNOSIS — M810 Age-related osteoporosis without current pathological fracture: Secondary | ICD-10-CM | POA: Diagnosis not present

## 2019-04-28 ENCOUNTER — Other Ambulatory Visit: Payer: Self-pay

## 2019-04-28 ENCOUNTER — Ambulatory Visit (INDEPENDENT_AMBULATORY_CARE_PROVIDER_SITE_OTHER): Payer: PPO | Admitting: Physical Therapy

## 2019-04-28 DIAGNOSIS — M79672 Pain in left foot: Secondary | ICD-10-CM | POA: Diagnosis not present

## 2019-04-28 DIAGNOSIS — M25561 Pain in right knee: Secondary | ICD-10-CM | POA: Diagnosis not present

## 2019-04-28 DIAGNOSIS — M25562 Pain in left knee: Secondary | ICD-10-CM | POA: Diagnosis not present

## 2019-04-28 DIAGNOSIS — M545 Low back pain, unspecified: Secondary | ICD-10-CM

## 2019-04-28 DIAGNOSIS — M79671 Pain in right foot: Secondary | ICD-10-CM

## 2019-04-28 DIAGNOSIS — R2689 Other abnormalities of gait and mobility: Secondary | ICD-10-CM

## 2019-04-28 DIAGNOSIS — G8929 Other chronic pain: Secondary | ICD-10-CM

## 2019-04-28 NOTE — Therapy (Signed)
[place], [place], [address] Phone: [phone]   Fax:  [phone]  Physical Therapy Treatment  Patient Details  Name: Bonnie Juarez MRN: [id number] Date of Birth: [birth year] Referring Provider (PT): Marybelle Killings, MD   Encounter Date: 04/28/2019  PT End of Session - 04/28/19 [id number]    Visit Number  2    Number of Visits  12    Date for PT Re-Evaluation  06/02/19    PT Start Time  1315    PT Stop Time  1410   last 10 min on heat   PT Time Calculation (min)  55 min    Activity Tolerance  Patient tolerated treatment well    Behavior During Therapy  [facility] for tasks assessed/performed       Past Medical History:  Diagnosis Date  . Anxiety   . Arthritis   . Cancer ([place])    Hx: of squamouscell carcinoma on Left shin basal cell on face; melanoma on right lower leg  . Complication of anesthesia    Dec 13, 2003 deceased LOC and hypoxia overmedicating with Morphine and Phenergan; received Narcan X 2, overnight pulse ox  . Depression   . Fibromyalgia   . Frequent urination   . GERD (gastroesophageal reflux disease)    Hx: of  . H/O hiatal hernia    PMH: of  . Headache(784.0)   . Hypertension   . IBS (irritable bowel syndrome)    Hx: of  . Osteopenia    Hx: of per pt, pt. adds osteoporosis- DOS  . Pneumonia    hx  . PONV (postoperative nausea and vomiting)   . Sleep apnea    Hx: of in the past-?mild apnea 10 yrs ago- followed by dr and none found    Past Surgical History:  Procedure Laterality Date  . APPENDECTOMY    . ARCUATE KERATECTOMY    . BREAST LUMPECTOMY Right 1991  . CERVICAL FUSION  1983  . FRACTURE SURGERY Right 2014   humerus  . HERNIA REPAIR     hiatal  . KNEE ARTHROSCOPY Right 2015  . [place] PLACATION  2010  . ORIF HUMERUS FRACTURE Right 01/08/2013   Procedure: OPEN REDUCTION INTERNAL FIXATION (ORIF) PROXIMAL HUMERUS FRACTURE;  Surgeon: Marybelle Killings, MD;  Location: [place];  Service: Orthopedics;   Laterality: Right;  Open Reduction Internal Fixation Right Proximal Humerus Fracture  . POSTERIOR CERVICAL FUSION/FORAMINOTOMY N/A 12/20/2016   Procedure: Posterior Cervical Fusion with lateral mass fixation - Cervical Three-Thoracic Two;  Surgeon: Eustace Moore, MD;  Location: [place];  Service: Neurosurgery;  Laterality: N/A;  . RADIAL HEAD REPLACEMENT  2005  . trigger thrumb release Left 2001  . tubular plasity  1970    There were no vitals filed for this visit.  Subjective Assessment - 04/28/19 1337    Subjective  been having overall more joint pain this week may be a fibromyalgia flare up or may be side effects of Covid vaccine.    Pertinent History  PMH: lumbar and cervical fusion, fibromyalgia,knee OA    Limitations  Walking;Standing;House hold activities    How long can you stand comfortably?  depends    How long can you walk comfortably?  depends    Diagnostic tests  XR show previous lumbar fusion, and knee OA    Patient Stated Goals  reduce the pain    Pain Onset  More than a month ago  [place] [facility] PT Treatment/Exercise - 04/28/19 0001      Exercises   Exercises  Other Exercises    Other Exercises   Nu step warm up for ROM and endurance L5 X 8 min UE/LE, Slantboard stretch for gastroc 30 sec X 3, then soleus 30 sec X 3, bilat leg press 62 lbs 2X10 with focus on slow eccentric lower. Supine clams green X 15 reps, supine bridge X 10 reps hold 5 sec, supine marches X 10 reps with green band around knees bilat      Modalities   Modalities  Moist Heat      Moist Heat Therapy   Number Minutes Moist Heat  10 Minutes    Moist Heat Location  Cervical;Lumbar Spine      Manual Therapy   Manual therapy comments  manual heel cord stretching, Long axis distraction bilat grade 2 60 sec  X2 ea interspaced with manual SKTC stretching                  PT Long Term Goals - 04/21/19 1135      PT LONG TERM GOAL #1   Title  Pt will be I and  compliant with HEP and verbalize plan for continued exercise post PT discharge. (Target goal for all goals 6 weeks 4/521)    Status  New      PT LONG TERM GOAL #2   Title  Pt will improve bilat leg strength grossly to at least 4+/5 MMT to improve function    Status  New      PT LONG TERM GOAL #3   Title  Pt will improve FGA to 24 or greater to show improved dynamic balance    Status  New      PT LONG TERM GOAL #4   Title  Pt will report overall less than 4/10 pain with usual activity including walking and standing up to 30 minutes    Status  New            Plan - 04/28/19 1401    Clinical Impression Statement  Session focused on overall gentle ROM and stretching for her low back, hips, knees, ankles to tolreance. She relays the stiffness in her joints was much improved after session. Continue POC.    Personal Factors and Comorbidities  Comorbidity 2    Comorbidities  PMH: lumbar and cervical fusion, fibromyalgia,knee OA    Examination-Activity Limitations  Lift;Stand;Stairs;Squat;Sleep;Carry;Locomotion Level    Examination-Participation Restrictions  Cleaning;Community Activity;Laundry;Shop    Stability/Clinical Decision Making  Evolving/Moderate complexity    Rehab Potential  Good    PT Frequency  2x / week    PT Duration  6 weeks    PT Treatment/Interventions  ADLs/Self Care Home Management;Aquatic Therapy;Cryotherapy;English as a second language teacher;Therapeutic activities;Therapeutic exercise;Balance training;Neuromuscular re-education;Patient/family education;Orthotic Fit/Training;Manual techniques;Dry needling;Passive range of motion;Taping    PT Next Visit Plan  review and update HEP PRN. consider modalaties and manual for pain, may benefit from aquatic if land PT does not help. needs bilat heel cord stretching, lumbar stretching, lumbar/LE strength, dynamic balance    PT Home Exercise Plan  Access Code: [email or web address]    Consulted and Agree  with Plan of Care  Patient       Patient will benefit from skilled therapeutic intervention in order to improve the following deficits and impairments:  Abnormal gait, Decreased activity tolerance, Decreased balance, Decreased endurance, Decreased mobility, Decreased range of motion, Decreased strength, Difficulty walking, Increased fascial restricitons, Impaired flexibility, Postural  dysfunction, Pain  Visit Diagnosis: Chronic bilateral low back pain without sciatica  Pain in right foot  Pain in left foot  Chronic pain of right knee  Chronic pain of left knee  Other abnormalities of gait and mobility     Problem List Patient Active Problem List   Diagnosis Date Noted  . Plantar fasciitis of left foot 10/02/2018  . S/P lumbar fusion 02/06/2018  . Mixed dyslipidemia 11/13/2017  . Family history of heart disease 11/13/2017  . Statin intolerance 11/13/2017  . Bilateral primary osteoarthritis of knee 10/30/2017  . S/P cervical spinal fusion 12/20/2016  . Spondylosis without myelopathy or radiculopathy, lumbar region 10/05/2016  . Chronic bilateral low back pain without sciatica 10/05/2016  . Fibromyalgia 10/05/2016  . Closed fracture of right proximal humerus 01/08/2013    Class: Acute    Bonnie Juarez 04/28/2019, 2:04 PM  [facility] Physical Therapy [address] [place], [place], [address] Phone: [phone]   Fax:  [phone]  Name: Bonnie Juarez MRN: [id number] Date of Birth: 1946/02/14

## 2019-04-29 DIAGNOSIS — Z79899 Other long term (current) drug therapy: Secondary | ICD-10-CM | POA: Diagnosis not present

## 2019-04-29 DIAGNOSIS — R7309 Other abnormal glucose: Secondary | ICD-10-CM | POA: Diagnosis not present

## 2019-04-29 DIAGNOSIS — F3342 Major depressive disorder, recurrent, in full remission: Secondary | ICD-10-CM | POA: Diagnosis not present

## 2019-04-29 DIAGNOSIS — E782 Mixed hyperlipidemia: Secondary | ICD-10-CM | POA: Diagnosis not present

## 2019-04-29 DIAGNOSIS — M797 Fibromyalgia: Secondary | ICD-10-CM | POA: Diagnosis not present

## 2019-04-29 DIAGNOSIS — E559 Vitamin D deficiency, unspecified: Secondary | ICD-10-CM | POA: Diagnosis not present

## 2019-04-29 DIAGNOSIS — I10 Essential (primary) hypertension: Secondary | ICD-10-CM | POA: Diagnosis not present

## 2019-04-29 DIAGNOSIS — F419 Anxiety disorder, unspecified: Secondary | ICD-10-CM | POA: Diagnosis not present

## 2019-04-29 DIAGNOSIS — K219 Gastro-esophageal reflux disease without esophagitis: Secondary | ICD-10-CM | POA: Diagnosis not present

## 2019-04-29 DIAGNOSIS — M810 Age-related osteoporosis without current pathological fracture: Secondary | ICD-10-CM | POA: Diagnosis not present

## 2019-04-30 ENCOUNTER — Ambulatory Visit: Payer: PPO | Admitting: Physical Therapy

## 2019-04-30 ENCOUNTER — Other Ambulatory Visit: Payer: Self-pay

## 2019-04-30 DIAGNOSIS — M79671 Pain in right foot: Secondary | ICD-10-CM

## 2019-04-30 DIAGNOSIS — M545 Low back pain, unspecified: Secondary | ICD-10-CM

## 2019-04-30 DIAGNOSIS — M79672 Pain in left foot: Secondary | ICD-10-CM

## 2019-04-30 DIAGNOSIS — G8929 Other chronic pain: Secondary | ICD-10-CM | POA: Diagnosis not present

## 2019-04-30 DIAGNOSIS — M25561 Pain in right knee: Secondary | ICD-10-CM | POA: Diagnosis not present

## 2019-04-30 DIAGNOSIS — R2689 Other abnormalities of gait and mobility: Secondary | ICD-10-CM

## 2019-04-30 DIAGNOSIS — M25562 Pain in left knee: Secondary | ICD-10-CM

## 2019-04-30 NOTE — Therapy (Addendum)
[facility] [place], [place], [address] Phone: [phone]   Fax:  [phone]  Physical Therapy Treatment  Patient Details  Name: Bonnie Juarez MRN: [id number] Date of Birth: June 24, 1945 Referring Provider (PT): Marybelle Killings, MD   Encounter Date: 04/30/2019  PT End of Session - 04/30/19 1542    Visit Number  3    Number of Visits  12    Date for PT Re-Evaluation  06/02/19    PT Start Time  [id number]    PT Stop Time  1538    PT Time Calculation (min)  51 min    Activity Tolerance  Patient tolerated treatment well    Behavior During Therapy  [facility] for tasks assessed/performed       Past Medical History:  Diagnosis Date  . Anxiety   . Arthritis   . Cancer ([place])    Hx: of squamouscell carcinoma on Left shin basal cell on face; melanoma on right lower leg  . Complication of anesthesia    11/30/2003 deceased LOC and hypoxia overmedicating with Morphine and Phenergan; received Narcan X 2, overnight pulse ox  . Depression   . Fibromyalgia   . Frequent urination   . GERD (gastroesophageal reflux disease)    Hx: of  . H/O hiatal hernia    PMH: of  . Headache(784.0)   . Hypertension   . IBS (irritable bowel syndrome)    Hx: of  . Osteopenia    Hx: of per pt, pt. adds osteoporosis- DOS  . Pneumonia    hx  . PONV (postoperative nausea and vomiting)   . Sleep apnea    Hx: of in the past-?mild apnea 10 yrs ago- followed by dr and none found    Past Surgical History:  Procedure Laterality Date  . APPENDECTOMY    . ARCUATE KERATECTOMY    . BREAST LUMPECTOMY Right 1991  . CERVICAL FUSION  1983  . FRACTURE SURGERY Right 2014   humerus  . HERNIA REPAIR     hiatal  . KNEE ARTHROSCOPY Right 2015  . [place] PLACATION  2010  . ORIF HUMERUS FRACTURE Right 01/08/2013   Procedure: OPEN REDUCTION INTERNAL FIXATION (ORIF) PROXIMAL HUMERUS FRACTURE;  Surgeon: Marybelle Killings, MD;  Location: [place];  Service: Orthopedics;  Laterality: Right;   Open Reduction Internal Fixation Right Proximal Humerus Fracture  . POSTERIOR CERVICAL FUSION/FORAMINOTOMY N/A 12/20/2016   Procedure: Posterior Cervical Fusion with lateral mass fixation - Cervical Three-Thoracic Two;  Surgeon: Eustace Moore, MD;  Location: [place];  Service: Neurosurgery;  Laterality: N/A;  . RADIAL HEAD REPLACEMENT  2005  . trigger thrumb release Left 2001  . tubular plasity  1970    There were no vitals filed for this visit.  Subjective Assessment - 04/30/19 1507    Subjective  feeling better since last session, less overall pain to 4/10 in her feet and back, better overall movement she reports   Pertinent History  PMH: lumbar and cervical fusion, fibromyalgia,knee OA    Limitations  Walking;Standing;House hold activities    How long can you stand comfortably?  depends    How long can you walk comfortably?  depends    Diagnostic tests  XR show previous lumbar fusion, and knee OA    Patient Stated Goals  reduce the pain    Pain Onset  More than a month ago      Objective Measurements:  Bilat hip strength overall 4/5 MMT grossly, bilat knee  strength overall 4/5 MMT grossly     [facility] Adult PT Treatment/Exercise - 04/30/19 0001      Exercises   Other Exercises   recumbant bike warm up for ROM and endurance L1 X 8 min UE/LE, Slantboard stretch for gastroc 30 sec X 3, then soleus 30 sec X 3, bilat leg press 62 lbs 2X10 with focus on slow eccentric lower 2X10 reps. Seated H.S curls red X 10 bilat, seated LAQ with 2 lb wt 2X10 bilat, standing hip abd and ext with 2 lb wts X 15 bilat, supine bridge X 10 reps hold 5 sec      Manual Therapy   Manual therapy comments  manual heel cord stretching, Long axis distraction bilat grade 2 60 sec  X2 ea interspaced with manual SKTC stretching        PT Long Term Goals - 04/21/19 1135      PT LONG TERM GOAL #1   Title  Pt will be I and compliant with HEP and verbalize plan for continued exercise post PT discharge. (Target goal  for all goals 6 weeks 4/521)    Status  ongoing     PT LONG TERM GOAL #2   Title  Pt will improve bilat leg strength grossly to at least 4+/5 MMT to improve function    Status  ongoing: hip/knee strength grossly now 4/5     PT LONG TERM GOAL #3   Title  Pt will improve FGA to 24 or greater to show improved dynamic balance    Status  New      PT LONG TERM GOAL #4   Title  Pt will report overall less than 4/10 pain with usual activity including walking and standing up to 30 minutes    Status  New            Plan - 04/30/19 1545    Clinical Impression Statement  She was able to perform gentle strength progression and more standing exericises today with good tolerance. PT will continue to progress as tolerated    Personal Factors and Comorbidities  Comorbidity 2    Comorbidities  PMH: lumbar and cervical fusion, fibromyalgia,knee OA    Examination-Activity Limitations  Lift;Stand;Stairs;Squat;Sleep;Carry;Locomotion Level    Examination-Participation Restrictions  Cleaning;Community Activity;Laundry;Shop    Stability/Clinical Decision Making  Evolving/Moderate complexity    Rehab Potential  Good    PT Frequency  2x / week    PT Duration  6 weeks    PT Treatment/Interventions  ADLs/Self Care Home Management;Aquatic Therapy;Cryotherapy;English as a second language teacher;Therapeutic activities;Therapeutic exercise;Balance training;Neuromuscular re-education;Patient/family education;Orthotic Fit/Training;Manual techniques;Dry needling;Passive range of motion;Taping    PT Next Visit Plan  review and update HEP PRN. consider modalaties and manual for pain, may benefit from aquatic if land PT does not help. needs bilat heel cord stretching, lumbar stretching, lumbar/LE strength, dynamic balance    PT Home Exercise Plan  Access Code: [email or web address]    Consulted and Agree with Plan of Care  Patient       Patient will benefit from skilled therapeutic  intervention in order to improve the following deficits and impairments:  Abnormal gait, Decreased activity tolerance, Decreased balance, Decreased endurance, Decreased mobility, Decreased range of motion, Decreased strength, Difficulty walking, Increased fascial restricitons, Impaired flexibility, Postural dysfunction, Pain  Visit Diagnosis: Chronic bilateral low back pain without sciatica  Pain in right foot  Pain in left foot  Chronic pain of right knee  Chronic pain of left knee  Other abnormalities of gait  and mobility     Problem List Patient Active Problem List   Diagnosis Date Noted  . Plantar fasciitis of left foot 10/02/2018  . S/P lumbar fusion 02/06/2018  . Mixed dyslipidemia 11/13/2017  . Family history of heart disease 11/13/2017  . Statin intolerance 11/13/2017  . Bilateral primary osteoarthritis of knee 10/30/2017  . S/P cervical spinal fusion 12/20/2016  . Spondylosis without myelopathy or radiculopathy, lumbar region 10/05/2016  . Chronic bilateral low back pain without sciatica 10/05/2016  . Fibromyalgia 10/05/2016  . Closed fracture of right proximal humerus 01/08/2013    Class: Acute    Debbe Odea, PT,DPT 04/30/2019, 4:06 PM  [facility] Physical Therapy [address] [place], [place], [address] Phone: [phone]   Fax:  [phone]  Name: Bonnie Juarez MRN: [id number] Date of Birth: [birth year]

## 2019-05-05 ENCOUNTER — Ambulatory Visit (INDEPENDENT_AMBULATORY_CARE_PROVIDER_SITE_OTHER): Payer: PPO | Admitting: Physical Therapy

## 2019-05-05 ENCOUNTER — Other Ambulatory Visit: Payer: Self-pay

## 2019-05-05 DIAGNOSIS — R2689 Other abnormalities of gait and mobility: Secondary | ICD-10-CM

## 2019-05-05 DIAGNOSIS — M79672 Pain in left foot: Secondary | ICD-10-CM

## 2019-05-05 DIAGNOSIS — M25561 Pain in right knee: Secondary | ICD-10-CM

## 2019-05-05 DIAGNOSIS — M545 Low back pain, unspecified: Secondary | ICD-10-CM

## 2019-05-05 DIAGNOSIS — M79671 Pain in right foot: Secondary | ICD-10-CM

## 2019-05-05 DIAGNOSIS — G8929 Other chronic pain: Secondary | ICD-10-CM

## 2019-05-05 DIAGNOSIS — M25562 Pain in left knee: Secondary | ICD-10-CM | POA: Diagnosis not present

## 2019-05-05 NOTE — Therapy (Addendum)
[facility] [place], [place], [address] Phone: [phone]   Fax:  [phone]  Physical Therapy Treatment  Patient Details  Name: Bonnie Juarez MRN: [id number] Date of Birth: 06/18/1945 Referring Provider (PT): Marybelle Killings, MD   Encounter Date: 05/05/2019  PT End of Session - 05/05/19 1548    Visit Number  4    Number of Visits  12    Date for PT Re-Evaluation  06/02/19    PT Start Time  [id number]    PT Stop Time  1530    PT Time Calculation (min)  45 min    Activity Tolerance  Patient tolerated treatment well    Behavior During Therapy  [facility] for tasks assessed/performed       Past Medical History:  Diagnosis Date  . Anxiety   . Arthritis   . Cancer ([place])    Hx: of squamouscell carcinoma on Left shin basal cell on face; melanoma on right lower leg  . Complication of anesthesia    12-02-03 deceased LOC and hypoxia overmedicating with Morphine and Phenergan; received Narcan X 2, overnight pulse ox  . Depression   . Fibromyalgia   . Frequent urination   . GERD (gastroesophageal reflux disease)    Hx: of  . H/O hiatal hernia    PMH: of  . Headache(784.0)   . Hypertension   . IBS (irritable bowel syndrome)    Hx: of  . Osteopenia    Hx: of per pt, pt. adds osteoporosis- DOS  . Pneumonia    hx  . PONV (postoperative nausea and vomiting)   . Sleep apnea    Hx: of in the past-?mild apnea 10 yrs ago- followed by dr and none found    Past Surgical History:  Procedure Laterality Date  . APPENDECTOMY    . ARCUATE KERATECTOMY    . BREAST LUMPECTOMY Right 1991  . CERVICAL FUSION  1983  . FRACTURE SURGERY Right 2014   humerus  . HERNIA REPAIR     hiatal  . KNEE ARTHROSCOPY Right 2015  . [place] PLACATION  2010  . ORIF HUMERUS FRACTURE Right 01/08/2013   Procedure: OPEN REDUCTION INTERNAL FIXATION (ORIF) PROXIMAL HUMERUS FRACTURE;  Surgeon: Marybelle Killings, MD;  Location: [place];  Service: Orthopedics;  Laterality: Right;   Open Reduction Internal Fixation Right Proximal Humerus Fracture  . POSTERIOR CERVICAL FUSION/FORAMINOTOMY N/A 12/20/2016   Procedure: Posterior Cervical Fusion with lateral mass fixation - Cervical Three-Thoracic Two;  Surgeon: Eustace Moore, MD;  Location: [place];  Service: Neurosurgery;  Laterality: N/A;  . RADIAL HEAD REPLACEMENT  2005  . trigger thrumb release Left 2001  . tubular plasity  1970    There were no vitals filed for this visit.  Subjective Assessment - 05/05/19 1546    Subjective  may have overdid it, pain is worse after sweeping. Overall 6/10 pain today in back, legs, feet    Pertinent History  PMH: lumbar and cervical fusion, fibromyalgia,knee OA    Limitations  Walking;Standing;House hold activities    How long can you stand comfortably?  depends    How long can you walk comfortably?  depends    Diagnostic tests  XR show previous lumbar fusion, and knee OA    Patient Stated Goals  reduce the pain    Pain Onset  More than a month ago       Objective Measurements:   Standardized Balance Assessment    Standardized Balance Assessment  Dynamic Gait Index        Functional Gait  Assessment   Gait assessed   --    Gait Level Surface  Walks 20 ft in less than 5.5 sec, no assistive devices, good speed, no evidence for imbalance, normal gait pattern, deviates no more than 6 in outside of the 12 in walkway width.    Change in Gait Speed  Able to smoothly change walking speed without loss of balance or gait deviation. Deviate no more than 6 in outside of the 12 in walkway width.    Gait with Horizontal Head Turns  Performs head turns with moderate changes in gait velocity, slows down, deviates 10-15 in outside 12 in walkway width but recovers, can continue to walk.    Gait with Vertical Head Turns  Performs task with moderate change in gait velocity, slows down, deviates 10-15 in outside 12 in walkway width but recovers, can continue to walk.    Gait and Pivot Turn   Pivot turns safely within 3 sec and stops quickly with no loss of balance.    Step Over Obstacle  Is able to step over 2 stacked shoe boxes taped together (9 in total height) without changing gait speed. No evidence of imbalance.    Gait with Narrow Base of Support  Ambulates 4-7 steps.    Gait with Eyes Closed  Walks 20 ft, slow speed, abnormal gait pattern, evidence for imbalance, deviates 10-15 in outside 12 in walkway width. Requires more than 9 sec to ambulate 20 ft.    Ambulating Backwards  Walks 20 ft, uses assistive device, slower speed, mild gait deviations, deviates 6-10 in outside 12 in walkway width.    Steps  Alternating feet, must use rail.    Total Score  20      [facility] Adult PT Treatment/Exercise - 05/05/19 0001      Self-Care   Self-Care  Other Self-Care Comments    Other Self-Care Comments   gave print out of night splint for plantar fasciitis      Exercises   Other Exercises   recumbant bike warm up for ROM and endurance L1 X 9 min with heat to her back, Slantboard stretch for gastroc 30 sec X 2, then soleus 30 sec X 2, standing heel toe raises X 10 ea, standing on airex pad for marching and hip abd X 10 bilat. seated LAQ with 2 lb wt 2X10 bilat,  supine bridge X 15 reps hold 5 sec, supine clams green X 15      Manual Therapy   Manual therapy comments  manual heel cord stretching and plantarfascia stretching             PT Education - 05/05/19 1548    Education Details  recommendation for night splint for plantarfascitis    Person(s) Educated  Patient    Methods  Explanation;Handout    Comprehension  Verbalized understanding          PT Long Term Goals - 04/21/19 1135      PT LONG TERM GOAL #1   Title  Pt will be I and compliant with HEP and verbalize plan for continued exercise post PT discharge. (Target goal for all goals 6 weeks 4/521)    Status  ongoing      PT LONG TERM GOAL #2   Title  Pt will improve bilat leg strength grossly to at least  4+/5 MMT to improve function    Status  ongoing  PT LONG TERM GOAL #3   Title  Pt will improve FGA to 24 or greater to show improved dynamic balance    Status  ongoing: 20     PT LONG TERM GOAL #4   Title  Pt will report overall less than 4/10 pain with usual activity including walking and standing up to 30 minutes    Status  ongoing           Plan - 05/05/19 1548    Clinical Impression Statement  Backed down on some of the exercise due to pain, did recommend she try night split to reduce plantarfasciits and she was provided with handout for where to purchase one    Personal Factors and Comorbidities  Comorbidity 2    Comorbidities  PMH: lumbar and cervical fusion, fibromyalgia,knee OA    Examination-Activity Limitations  Lift;Stand;Stairs;Squat;Sleep;Carry;Locomotion Level    Examination-Participation Restrictions  Cleaning;Community Activity;Laundry;Shop    Stability/Clinical Decision Making  Evolving/Moderate complexity    Rehab Potential  Good    PT Frequency  2x / week    PT Duration  6 weeks    PT Treatment/Interventions  ADLs/Self Care Home Management;Aquatic Therapy;Cryotherapy;English as a second language teacher;Therapeutic activities;Therapeutic exercise;Balance training;Neuromuscular re-education;Patient/family education;Orthotic Fit/Training;Manual techniques;Dry needling;Passive range of motion;Taping    PT Next Visit Plan  review and update HEP PRN. consider modalaties and manual for pain, may benefit from aquatic if land PT does not help. needs bilat heel cord stretching, lumbar stretching, lumbar/LE strength, dynamic balance    PT Home Exercise Plan  Access Code: [email or web address]    Consulted and Agree with Plan of Care  Patient       Patient will benefit from skilled therapeutic intervention in order to improve the following deficits and impairments:  Abnormal gait, Decreased activity tolerance, Decreased balance, Decreased  endurance, Decreased mobility, Decreased range of motion, Decreased strength, Difficulty walking, Increased fascial restricitons, Impaired flexibility, Postural dysfunction, Pain  Visit Diagnosis: Chronic bilateral low back pain without sciatica  Pain in right foot  Pain in left foot  Chronic pain of right knee  Chronic pain of left knee  Other abnormalities of gait and mobility     Problem List Patient Active Problem List   Diagnosis Date Noted  . Plantar fasciitis of left foot 10/02/2018  . S/P lumbar fusion 02/06/2018  . Mixed dyslipidemia 11/13/2017  . Family history of heart disease 11/13/2017  . Statin intolerance 11/13/2017  . Bilateral primary osteoarthritis of knee 10/30/2017  . S/P cervical spinal fusion 12/20/2016  . Spondylosis without myelopathy or radiculopathy, lumbar region 10/05/2016  . Chronic bilateral low back pain without sciatica 10/05/2016  . Fibromyalgia 10/05/2016  . Closed fracture of right proximal humerus 01/08/2013    Class: Acute    Debbe Odea, PT,DPT 05/05/2019, [facility] [address] [place], [place], [address] Phone: [phone]   Fax:  [phone]  Name: Bonnie Juarez MRN: [id number] Date of Birth: 09/07/1945

## 2019-05-06 DIAGNOSIS — Z86018 Personal history of other benign neoplasm: Secondary | ICD-10-CM | POA: Diagnosis not present

## 2019-05-06 DIAGNOSIS — L821 Other seborrheic keratosis: Secondary | ICD-10-CM | POA: Diagnosis not present

## 2019-05-06 DIAGNOSIS — L570 Actinic keratosis: Secondary | ICD-10-CM | POA: Diagnosis not present

## 2019-05-06 DIAGNOSIS — Z85828 Personal history of other malignant neoplasm of skin: Secondary | ICD-10-CM | POA: Diagnosis not present

## 2019-05-06 DIAGNOSIS — F4310 Post-traumatic stress disorder, unspecified: Secondary | ICD-10-CM | POA: Diagnosis not present

## 2019-05-06 DIAGNOSIS — L820 Inflamed seborrheic keratosis: Secondary | ICD-10-CM | POA: Diagnosis not present

## 2019-05-06 DIAGNOSIS — L578 Other skin changes due to chronic exposure to nonionizing radiation: Secondary | ICD-10-CM | POA: Diagnosis not present

## 2019-05-06 DIAGNOSIS — D225 Melanocytic nevi of trunk: Secondary | ICD-10-CM | POA: Diagnosis not present

## 2019-05-06 DIAGNOSIS — F411 Generalized anxiety disorder: Secondary | ICD-10-CM | POA: Diagnosis not present

## 2019-05-06 DIAGNOSIS — L814 Other melanin hyperpigmentation: Secondary | ICD-10-CM | POA: Diagnosis not present

## 2019-05-06 DIAGNOSIS — Z85820 Personal history of malignant melanoma of skin: Secondary | ICD-10-CM | POA: Diagnosis not present

## 2019-05-08 ENCOUNTER — Encounter: Payer: PPO | Admitting: Physical Therapy

## 2019-05-12 ENCOUNTER — Ambulatory Visit (INDEPENDENT_AMBULATORY_CARE_PROVIDER_SITE_OTHER): Payer: PPO | Admitting: Physical Therapy

## 2019-05-12 ENCOUNTER — Other Ambulatory Visit: Payer: Self-pay

## 2019-05-12 DIAGNOSIS — M79671 Pain in right foot: Secondary | ICD-10-CM

## 2019-05-12 DIAGNOSIS — M545 Low back pain, unspecified: Secondary | ICD-10-CM

## 2019-05-12 DIAGNOSIS — M79672 Pain in left foot: Secondary | ICD-10-CM

## 2019-05-12 DIAGNOSIS — G8929 Other chronic pain: Secondary | ICD-10-CM

## 2019-05-12 DIAGNOSIS — R2689 Other abnormalities of gait and mobility: Secondary | ICD-10-CM | POA: Diagnosis not present

## 2019-05-12 NOTE — Therapy (Addendum)
[facility] Physical Therapy [address] [place], [place], [address] Phone: [phone]   Fax:  [phone]  Physical Therapy Treatment  Patient Details  Name: Bonnie Juarez MRN: [id number] Date of Birth: 03/30/1945 Referring Provider (PT): Marybelle Killings, MD   Encounter Date: 05/12/2019  PT End of Session - 05/12/19 1327    Visit Number  5    Number of Visits  12    Date for PT Re-Evaluation  06/02/19    PT Start Time  [id number]    PT Stop Time  1235    PT Time Calculation (min)  50 min    Activity Tolerance  Patient tolerated treatment well    Behavior During Therapy  [facility] for tasks assessed/performed       Past Medical History:  Diagnosis Date  . Anxiety   . Arthritis   . Cancer ([place])    Hx: of squamouscell carcinoma on Left shin basal cell on face; melanoma on right lower leg  . Complication of anesthesia    November 27, 2003 deceased LOC and hypoxia overmedicating with Morphine and Phenergan; received Narcan X 2, overnight pulse ox  . Depression   . Fibromyalgia   . Frequent urination   . GERD (gastroesophageal reflux disease)    Hx: of  . H/O hiatal hernia    PMH: of  . Headache(784.0)   . Hypertension   . IBS (irritable bowel syndrome)    Hx: of  . Osteopenia    Hx: of per pt, pt. adds osteoporosis- DOS  . Pneumonia    hx  . PONV (postoperative nausea and vomiting)   . Sleep apnea    Hx: of in the past-?mild apnea 10 yrs ago- followed by dr and none found    Past Surgical History:  Procedure Laterality Date  . APPENDECTOMY    . ARCUATE KERATECTOMY    . BREAST LUMPECTOMY Right 1991  . CERVICAL FUSION  1983  . FRACTURE SURGERY Right 2014   humerus  . HERNIA REPAIR     hiatal  . KNEE ARTHROSCOPY Right 2015  . [place] PLACATION  2010  . ORIF HUMERUS FRACTURE Right 01/08/2013   Procedure: OPEN REDUCTION INTERNAL FIXATION (ORIF) PROXIMAL HUMERUS FRACTURE;  Surgeon: Marybelle Killings, MD;  Location: [place];  Service: Orthopedics;  Laterality: Right;   Open Reduction Internal Fixation Right Proximal Humerus Fracture  . POSTERIOR CERVICAL FUSION/FORAMINOTOMY N/A 12/20/2016   Procedure: Posterior Cervical Fusion with lateral mass fixation - Cervical Three-Thoracic Two;  Surgeon: Eustace Moore, MD;  Location: [place];  Service: Neurosurgery;  Laterality: N/A;  . RADIAL HEAD REPLACEMENT  2005  . trigger thrumb release Left 2001  . tubular plasity  1970    There were no vitals filed for this visit.  Subjective Assessment - 05/12/19 1230    Subjective  Plantarfaciiits still flared up today 5/10, It may have been due to wearing different shoes.    Pertinent History  PMH: lumbar and cervical fusion, fibromyalgia,knee OA    Limitations  Walking;Standing;House hold activities    How long can you stand comfortably?  depends    How long can you walk comfortably?  depends    Diagnostic tests  XR show previous lumbar fusion, and knee OA    Patient Stated Goals  reduce the pain    Pain Onset  More than a month ago      Objective Measurements:  Lumbar AROM now 60% ROM compared to 50% at Evaluation Overall hip strength  4/5 MMT grossly, knee strength 4+/5 MMT bilat    [facility] Adult PT Treatment/Exercise - 05/12/19 0001      Exercises   Other Exercises   recumbant bike warm up for ROM and endurance L2 X 6 min with heat to her back, Slantboard stretch for gastroc 30 sec X 2, then soleus 30 sec X 2, supine bridge X 15 reps hold 5 sec with green band, supine clams green X 15 with contralateral iso hold      Manual Therapy   Manual therapy comments  manual heel cord stretching and plantarfascia stretching, ankle PROM bilat, STM/IASTM to plantarfascia, achillies bilat. KT tape for plantarfascia        PT Education - 05/12/19 1327    Education Details  KT tape precautions and instructions    Person(s) Educated  Patient    Methods  Explanation    Comprehension  Verbalized understanding          PT Long Term Goals - 05/12/19 1330      PT LONG  TERM GOAL #1   Title  Pt will be I and compliant with HEP and verbalize plan for continued exercise post PT discharge. (Target goal for all goals 6 weeks 4/521)    Status  On-going now compliant with current HEP      PT LONG TERM GOAL #2   Title  Pt will improve bilat leg strength grossly to at least 4+/5 MMT to improve function    Status  On-going 4/5     PT LONG TERM GOAL #3   Title  Pt will improve FGA to 24 or greater to show improved dynamic balance    Status  On-going, now 20      PT LONG TERM GOAL #4   Title  Pt will report overall less than 4/10 pain with usual activity including walking and standing up to 30 minutes    Status  On-going            Plan - 05/12/19 1328    Clinical Impression Statement  focuesed more on her plantarfasciitis today as this was her biggest complaint. Treated with stretching, STM/IASTM, and KT tape. PT will assess her response to this as able.    Personal Factors and Comorbidities  Comorbidity 2    Comorbidities  PMH: lumbar and cervical fusion, fibromyalgia,knee OA    Examination-Activity Limitations  Lift;Stand;Stairs;Squat;Sleep;Carry;Locomotion Level    Examination-Participation Restrictions  Cleaning;Community Activity;Laundry;Shop    Stability/Clinical Decision Making  Evolving/Moderate complexity    Rehab Potential  Good    PT Frequency  2x / week    PT Duration  6 weeks    PT Treatment/Interventions  ADLs/Self Care Home Management;Aquatic Therapy;Cryotherapy;English as a second language teacher;Therapeutic activities;Therapeutic exercise;Balance training;Neuromuscular re-education;Patient/family education;Orthotic Fit/Training;Manual techniques;Dry needling;Passive range of motion;Taping    PT Next Visit Plan  review and update HEP PRN. consider modalaties and manual for pain, may benefit from aquatic if land PT does not help. needs bilat heel cord stretching, lumbar stretching, lumbar/LE strength,  dynamic balance    PT Home Exercise Plan  Access Code: [email or web address]    Consulted and Agree with Plan of Care  Patient       Patient will benefit from skilled therapeutic intervention in order to improve the following deficits and impairments:  Abnormal gait, Decreased activity tolerance, Decreased balance, Decreased endurance, Decreased mobility, Decreased range of motion, Decreased strength, Difficulty walking, Increased fascial restricitons, Impaired flexibility, Postural dysfunction, Pain  Visit Diagnosis: Chronic bilateral low back  pain without sciatica  Pain in right foot  Pain in left foot  Other abnormalities of gait and mobility     Problem List Patient Active Problem List   Diagnosis Date Noted  . Plantar fasciitis of left foot 10/02/2018  . S/P lumbar fusion 02/06/2018  . Mixed dyslipidemia 11/13/2017  . Family history of heart disease 11/13/2017  . Statin intolerance 11/13/2017  . Bilateral primary osteoarthritis of knee 10/30/2017  . S/P cervical spinal fusion 12/20/2016  . Spondylosis without myelopathy or radiculopathy, lumbar region 10/05/2016  . Chronic bilateral low back pain without sciatica 10/05/2016  . Fibromyalgia 10/05/2016  . Closed fracture of right proximal humerus 01/08/2013    Class: Acute    Debbe Odea, PT,DPT 05/12/2019, 1:30 PM  [facility] [place], [place], [address] Phone: [phone]   Fax:  [phone]  Name: Bonnie Juarez MRN: [id number] Date of Birth: Nov 28, 1945

## 2019-05-14 ENCOUNTER — Ambulatory Visit: Payer: PPO | Attending: Internal Medicine

## 2019-05-14 DIAGNOSIS — Z23 Encounter for immunization: Secondary | ICD-10-CM

## 2019-05-14 NOTE — Progress Notes (Signed)
   Covid-19 Vaccination Clinic  Name:  Bonnie Juarez    MRN: [id number] DOB: [birth year]  05/14/2019  [name] was observed post Covid-19 immunization for 15 minutes without incident. She was provided with Vaccine Information Sheet and instruction to access the V-Safe system.   [name] was instructed to call 911 with any severe reactions post vaccine: Marland Kitchen Difficulty breathing  . Swelling of face and throat  . A fast heartbeat  . A bad rash all over body  . Dizziness and weakness   Immunizations Administered    Name Date Dose VIS Date Route   Pfizer COVID-19 Vaccine 05/14/2019 10:14 AM 0.3 mL 02/07/2019 Intramuscular   Manufacturer: [place]   Lot: [id number]   [place]: [id number]

## 2019-05-15 ENCOUNTER — Encounter: Payer: PPO | Admitting: Physical Therapy

## 2019-05-19 ENCOUNTER — Other Ambulatory Visit: Payer: Self-pay

## 2019-05-19 ENCOUNTER — Ambulatory Visit (INDEPENDENT_AMBULATORY_CARE_PROVIDER_SITE_OTHER): Payer: PPO | Admitting: Physical Therapy

## 2019-05-19 DIAGNOSIS — M79671 Pain in right foot: Secondary | ICD-10-CM

## 2019-05-19 DIAGNOSIS — M79672 Pain in left foot: Secondary | ICD-10-CM | POA: Diagnosis not present

## 2019-05-19 DIAGNOSIS — M545 Low back pain, unspecified: Secondary | ICD-10-CM

## 2019-05-19 DIAGNOSIS — M25562 Pain in left knee: Secondary | ICD-10-CM | POA: Diagnosis not present

## 2019-05-19 DIAGNOSIS — G8929 Other chronic pain: Secondary | ICD-10-CM

## 2019-05-19 DIAGNOSIS — R2689 Other abnormalities of gait and mobility: Secondary | ICD-10-CM | POA: Diagnosis not present

## 2019-05-19 NOTE — Therapy (Signed)
[place], [place], [address] Phone: [phone]   Fax:  [phone]  Physical Therapy Treatment  Patient Details  Name: Bonnie Juarez MRN: [id number] Date of Birth: April 30, 1945 Referring Provider (PT): Marybelle Killings, MD   Encounter Date: 05/19/2019  PT End of Session - 05/19/19 1329    Visit Number  6    Number of Visits  12    Date for PT Re-Evaluation  06/02/19    PT Start Time  [id number]    PT Stop Time  1230    PT Time Calculation (min)  45 min    Activity Tolerance  Patient tolerated treatment well    Behavior During Therapy  [facility] for tasks assessed/performed       Past Medical History:  Diagnosis Date  . Anxiety   . Arthritis   . Cancer ([place])    Hx: of squamouscell carcinoma on Left shin basal cell on face; melanoma on right lower leg  . Complication of anesthesia    12/21/2003 deceased LOC and hypoxia overmedicating with Morphine and Phenergan; received Narcan X 2, overnight pulse ox  . Depression   . Fibromyalgia   . Frequent urination   . GERD (gastroesophageal reflux disease)    Hx: of  . H/O hiatal hernia    PMH: of  . Headache(784.0)   . Hypertension   . IBS (irritable bowel syndrome)    Hx: of  . Osteopenia    Hx: of per pt, pt. adds osteoporosis- DOS  . Pneumonia    hx  . PONV (postoperative nausea and vomiting)   . Sleep apnea    Hx: of in the past-?mild apnea 10 yrs ago- followed by dr and none found    Past Surgical History:  Procedure Laterality Date  . APPENDECTOMY    . ARCUATE KERATECTOMY    . BREAST LUMPECTOMY Right 1991  . CERVICAL FUSION  1983  . FRACTURE SURGERY Right 2014   humerus  . HERNIA REPAIR     hiatal  . KNEE ARTHROSCOPY Right 2015  . [place] PLACATION  2010  . ORIF HUMERUS FRACTURE Right 01/08/2013   Procedure: OPEN REDUCTION INTERNAL FIXATION (ORIF) PROXIMAL HUMERUS FRACTURE;  Surgeon: Marybelle Killings, MD;  Location: [place];  Service: Orthopedics;  Laterality: Right;   Open Reduction Internal Fixation Right Proximal Humerus Fracture  . POSTERIOR CERVICAL FUSION/FORAMINOTOMY N/A 12/20/2016   Procedure: Posterior Cervical Fusion with lateral mass fixation - Cervical Three-Thoracic Two;  Surgeon: Eustace Moore, MD;  Location: [place];  Service: Neurosurgery;  Laterality: N/A;  . RADIAL HEAD REPLACEMENT  2005  . trigger thrumb release Left 2001  . tubular plasity  1970    There were no vitals filed for this visit.  Subjective Assessment - 05/19/19 1326    Subjective  Does not know if the tape helped her feet or not, had more pain when it was on but now feels much better after tape is off.    Pertinent History  PMH: lumbar and cervical fusion, fibromyalgia,knee OA    Limitations  Walking;Standing;House hold activities    How long can you stand comfortably?  depends    How long can you walk comfortably?  depends    Diagnostic tests  XR show previous lumbar fusion, and knee OA    Patient Stated Goals  reduce the pain    Pain Onset  More than a month ago       [facility] Adult PT  Treatment/Exercise - 05/19/19 0001      Exercises   Other Exercises   recumbant bike warm up for ROM and endurance L2 X 8 min with heat to her back, Slantboard stretch for gastroc 30 sec X 2, then soleus 30 sec X 2, seated lumbar flexion stretching fwd 10 sec X 5 reps then lateral 10 sec X 3 reps ea side. LTR 5 sec X 10 ea side, SKTC 30 sec X 2 bialt. supine bridge X 15 reps hold 5 sec with green band, supine clams green X 15 with contralateral iso hold      Manual Therapy   Manual therapy comments  manual heel cord stretching and plantarfascia stretching, ankle PROM bilat, STM/IASTM to plantarfascia bilat        PT Long Term Goals - 05/12/19 1330      PT LONG TERM GOAL #1   Title  Pt will be I and compliant with HEP and verbalize plan for continued exercise post PT discharge. (Target goal for all goals 6 weeks 4/521)    Status  On-going      PT LONG TERM GOAL #2   Title  Pt will  improve bilat leg strength grossly to at least 4+/5 MMT to improve function    Status  On-going      PT LONG TERM GOAL #3   Title  Pt will improve FGA to 24 or greater to show improved dynamic balance    Status  On-going      PT LONG TERM GOAL #4   Title  Pt will report overall less than 4/10 pain with usual activity including walking and standing up to 30 minutes    Status  On-going            Plan - 05/19/19 1329    Clinical Impression Statement  Apppears to be responding well with STM/IASTM to her heelcords and plantarfascia.Continue with stretching and strength for hips/lumbar, and feet as toerated. PT will continue to progress activity tolerance slowly as able.    Personal Factors and Comorbidities  Comorbidity 2    Comorbidities  PMH: lumbar and cervical fusion, fibromyalgia,knee OA    Examination-Activity Limitations  Lift;Stand;Stairs;Squat;Sleep;Carry;Locomotion Level    Examination-Participation Restrictions  Cleaning;Community Activity;Laundry;Shop    Stability/Clinical Decision Making  Evolving/Moderate complexity    Rehab Potential  Good    PT Frequency  2x / week    PT Duration  6 weeks    PT Treatment/Interventions  ADLs/Self Care Home Management;Aquatic Therapy;Cryotherapy;English as a second language teacher;Therapeutic activities;Therapeutic exercise;Balance training;Neuromuscular re-education;Patient/family education;Orthotic Fit/Training;Manual techniques;Dry needling;Passive range of motion;Taping    PT Next Visit Plan  review and update HEP PRN. consider modalaties and manual for pain, may benefit from aquatic if land PT does not help. needs bilat heel cord stretching, lumbar stretching, lumbar/LE strength, dynamic balance    PT Home Exercise Plan  Access Code: [email or web address]    Consulted and Agree with Plan of Care  Patient       Patient will benefit from skilled therapeutic intervention in order to improve the following  deficits and impairments:  Abnormal gait, Decreased activity tolerance, Decreased balance, Decreased endurance, Decreased mobility, Decreased range of motion, Decreased strength, Difficulty walking, Increased fascial restricitons, Impaired flexibility, Postural dysfunction, Pain  Visit Diagnosis: Chronic bilateral low back pain without sciatica  Pain in right foot  Pain in left foot  Other abnormalities of gait and mobility  Chronic pain of left knee     Problem List Patient Active Problem  List   Diagnosis Date Noted  . Plantar fasciitis of left foot 10/02/2018  . S/P lumbar fusion 02/06/2018  . Mixed dyslipidemia 11/13/2017  . Family history of heart disease 11/13/2017  . Statin intolerance 11/13/2017  . Bilateral primary osteoarthritis of knee 10/30/2017  . S/P cervical spinal fusion 12/20/2016  . Spondylosis without myelopathy or radiculopathy, lumbar region 10/05/2016  . Chronic bilateral low back pain without sciatica 10/05/2016  . Fibromyalgia 10/05/2016  . Closed fracture of right proximal humerus 01/08/2013    Class: Acute    Debbe Odea, PT,DPT 05/19/2019, 1:31 PM  [facility] [address] [place], [place], [address] Phone: [phone]   Fax:  [phone]  Name: KRYSTALLE HANDRICK MRN: [id number] Date of Birth: 1945/07/31

## 2019-05-21 ENCOUNTER — Ambulatory Visit (INDEPENDENT_AMBULATORY_CARE_PROVIDER_SITE_OTHER): Payer: PPO | Admitting: Physical Therapy

## 2019-05-21 ENCOUNTER — Other Ambulatory Visit: Payer: Self-pay

## 2019-05-21 DIAGNOSIS — M545 Low back pain, unspecified: Secondary | ICD-10-CM

## 2019-05-21 DIAGNOSIS — M79671 Pain in right foot: Secondary | ICD-10-CM

## 2019-05-21 DIAGNOSIS — M25562 Pain in left knee: Secondary | ICD-10-CM

## 2019-05-21 DIAGNOSIS — G8929 Other chronic pain: Secondary | ICD-10-CM | POA: Diagnosis not present

## 2019-05-21 DIAGNOSIS — R2689 Other abnormalities of gait and mobility: Secondary | ICD-10-CM | POA: Diagnosis not present

## 2019-05-21 DIAGNOSIS — M79672 Pain in left foot: Secondary | ICD-10-CM

## 2019-05-21 DIAGNOSIS — M25561 Pain in right knee: Secondary | ICD-10-CM | POA: Diagnosis not present

## 2019-05-21 NOTE — Therapy (Addendum)
[place], [place], [address] Phone: [phone]   Fax:  [phone]  Physical Therapy Treatment  Patient Details  Name: Bonnie Juarez MRN: [id number] Date of Birth: [birth year] Referring Provider (PT): Marybelle Killings, MD   Encounter Date: 05/21/2019  PT End of Session - 05/21/19 1234    Visit Number  7    Number of Visits  12    Date for PT Re-Evaluation  06/02/19    PT Start Time  [id number]    PT Stop Time  1230    PT Time Calculation (min)  45 min    Activity Tolerance  Patient tolerated treatment well    Behavior During Therapy  [facility] for tasks assessed/performed       Past Medical History:  Diagnosis Date  . Anxiety   . Arthritis   . Cancer ([place])    Hx: of squamouscell carcinoma on Left shin basal cell on face; melanoma on right lower leg  . Complication of anesthesia    2003-11-28 deceased LOC and hypoxia overmedicating with Morphine and Phenergan; received Narcan X 2, overnight pulse ox  . Depression   . Fibromyalgia   . Frequent urination   . GERD (gastroesophageal reflux disease)    Hx: of  . H/O hiatal hernia    PMH: of  . Headache(784.0)   . Hypertension   . IBS (irritable bowel syndrome)    Hx: of  . Osteopenia    Hx: of per pt, pt. adds osteoporosis- DOS  . Pneumonia    hx  . PONV (postoperative nausea and vomiting)   . Sleep apnea    Hx: of in the past-?mild apnea 10 yrs ago- followed by dr and none found    Past Surgical History:  Procedure Laterality Date  . APPENDECTOMY    . ARCUATE KERATECTOMY    . BREAST LUMPECTOMY Right 1991  . CERVICAL FUSION  1983  . FRACTURE SURGERY Right 2014   humerus  . HERNIA REPAIR     hiatal  . KNEE ARTHROSCOPY Right 2015  . [place] PLACATION  2010  . ORIF HUMERUS FRACTURE Right 01/08/2013   Procedure: OPEN REDUCTION INTERNAL FIXATION (ORIF) PROXIMAL HUMERUS FRACTURE;  Surgeon: Marybelle Killings, MD;  Location: [place];  Service: Orthopedics;  Laterality: Right;   Open Reduction Internal Fixation Right Proximal Humerus Fracture  . POSTERIOR CERVICAL FUSION/FORAMINOTOMY N/A 12/20/2016   Procedure: Posterior Cervical Fusion with lateral mass fixation - Cervical Three-Thoracic Two;  Surgeon: Eustace Moore, MD;  Location: [place];  Service: Neurosurgery;  Laterality: N/A;  . RADIAL HEAD REPLACEMENT  2005  . trigger thrumb release Left 2001  . tubular plasity  1970    There were no vitals filed for this visit.  Subjective Assessment - 05/21/19 1230    Subjective  relays some overall soreness but her back and feet are not in as much pain as they were, pain is less than 3 today.   Pertinent History  PMH: lumbar and cervical fusion, fibromyalgia,knee OA    Limitations  Walking;Standing;House hold activities    How long can you stand comfortably?  depends    How long can you walk comfortably?  depends    Diagnostic tests  XR show previous lumbar fusion, and knee OA    Patient Stated Goals  reduce the pain    Pain Onset  More than a month ago      Objective Measurements: Lumbar AROM now [facility] 75% all  planes    [facility] Adult PT Treatment/Exercise - 05/21/19 0001      Exercises   Other Exercises   UBE UE/LE warm up for ROM and endurance L2 X 8 min with heat to her back, Slantboard stretch for gastroc 30 sec X 3, then soleus 30 sec X 3, Heel raises off step X 15 bilat with one UE support, supine DKTC with feet on pball 10 sec X 10 reps, LTR 5 sec X 10 ea side, supine bridge X 15 reps hold 5 sec,  supine clams green 2X10, supine marches with TAC  X10 bilat, seated rows with green 2X10             PT Education - 05/21/19 1234    Education Details  added rows into HEP and gave her green band to do rows and clams    Person(s) Educated  Patient    Methods  Explanation;Demonstration;Verbal cues    Comprehension  Verbalized understanding;Returned demonstration          PT Long Term Goals - 05/12/19 1330      PT LONG TERM GOAL #1   Title  Pt will be  I and compliant with HEP and verbalize plan for continued exercise post PT discharge. (Target goal for all goals 6 weeks 4/521)    Status  On-going      PT LONG TERM GOAL #2   Title  Pt will improve bilat leg strength grossly to at least 4+/5 MMT to improve function    Status  On-going      PT LONG TERM GOAL #3   Title  Pt will improve FGA to 24 or greater to show improved dynamic balance    Status  On-going      PT LONG TERM GOAL #4   Title  Pt will report overall less than 4/10 pain with usual activity including walking and standing up to 30 minutes    Status  On-going            Plan - 05/21/19 1235    Clinical Impression Statement  Session focused on stretching and strengthening for ankles, hips, lumbar with improved overall tolerance and less pain. Continue POC    Personal Factors and Comorbidities  Comorbidity 2    Comorbidities  PMH: lumbar and cervical fusion, fibromyalgia,knee OA    Examination-Activity Limitations  Lift;Stand;Stairs;Squat;Sleep;Carry;Locomotion Level    Examination-Participation Restrictions  Cleaning;Community Activity;Laundry;Shop    Stability/Clinical Decision Making  Evolving/Moderate complexity    Rehab Potential  Good    PT Frequency  2x / week    PT Duration  6 weeks    PT Treatment/Interventions  ADLs/Self Care Home Management;Aquatic Therapy;Cryotherapy;English as a second language teacher;Therapeutic activities;Therapeutic exercise;Balance training;Neuromuscular re-education;Patient/family education;Orthotic Fit/Training;Manual techniques;Dry needling;Passive range of motion;Taping    PT Next Visit Plan  review and update HEP PRN. consider modalaties and manual for pain, may benefit from aquatic if land PT does not help. needs bilat heel cord stretching, lumbar stretching, lumbar/LE strength, dynamic balance    PT Home Exercise Plan  Access Code: [email or web address], added rows and clams with green    Consulted and  Agree with Plan of Care  Patient       Patient will benefit from skilled therapeutic intervention in order to improve the following deficits and impairments:  Abnormal gait, Decreased activity tolerance, Decreased balance, Decreased endurance, Decreased mobility, Decreased range of motion, Decreased strength, Difficulty walking, Increased fascial restricitons, Impaired flexibility, Postural dysfunction, Pain  Visit Diagnosis: Chronic bilateral low  back pain without sciatica  Pain in right foot  Pain in left foot  Other abnormalities of gait and mobility  Chronic pain of left knee  Chronic pain of right knee     Problem List Patient Active Problem List   Diagnosis Date Noted  . Plantar fasciitis of left foot 10/02/2018  . S/P lumbar fusion 02/06/2018  . Mixed dyslipidemia 11/13/2017  . Family history of heart disease 11/13/2017  . Statin intolerance 11/13/2017  . Bilateral primary osteoarthritis of knee 10/30/2017  . S/P cervical spinal fusion 12/20/2016  . Spondylosis without myelopathy or radiculopathy, lumbar region 10/05/2016  . Chronic bilateral low back pain without sciatica 10/05/2016  . Fibromyalgia 10/05/2016  . Closed fracture of right proximal humerus 01/08/2013    Class: Acute    Debbe Odea, PT,DPT 05/21/2019, 12:38 PM  [facility] [address] [place], [place], [address] Phone: [phone]   Fax:  [phone]  Name: TWANNA CROWEL MRN: [id number] Date of Birth: 1945-11-09

## 2019-05-27 ENCOUNTER — Encounter: Payer: Self-pay | Admitting: Orthopaedic Surgery

## 2019-05-27 ENCOUNTER — Ambulatory Visit: Payer: PPO | Admitting: Orthopaedic Surgery

## 2019-05-27 ENCOUNTER — Other Ambulatory Visit: Payer: Self-pay

## 2019-05-27 VITALS — BP 139/71 | HR 60 | Ht 66.25 in | Wt 176.0 lb

## 2019-05-27 DIAGNOSIS — M722 Plantar fascial fibromatosis: Secondary | ICD-10-CM | POA: Diagnosis not present

## 2019-05-27 DIAGNOSIS — M170 Bilateral primary osteoarthritis of knee: Secondary | ICD-10-CM

## 2019-05-27 DIAGNOSIS — M2241 Chondromalacia patellae, right knee: Secondary | ICD-10-CM

## 2019-05-27 DIAGNOSIS — M2242 Chondromalacia patellae, left knee: Secondary | ICD-10-CM | POA: Diagnosis not present

## 2019-05-27 MED ORDER — LIDOCAINE HCL 1 % IJ SOLN
0.5000 mL | INTRAMUSCULAR | Status: AC | PRN
  Administered 2019-05-27: .5 mL

## 2019-05-27 MED ORDER — BUPIVACAINE HCL 0.25 % IJ SOLN
4.0000 mL | INTRAMUSCULAR | Status: AC | PRN
  Administered 2019-05-27: 4 mL via INTRA_ARTICULAR

## 2019-05-27 MED ORDER — METHYLPREDNISOLONE ACETATE 40 MG/ML IJ SUSP
40.0000 mg | INTRAMUSCULAR | Status: AC | PRN
  Administered 2019-05-27: 40 mg via INTRA_ARTICULAR

## 2019-05-27 MED ORDER — BUPIVACAINE HCL 0.5 % IJ SOLN
3.0000 mL | INTRAMUSCULAR | Status: AC | PRN
  Administered 2019-05-27: 3 mL via INTRA_ARTICULAR

## 2019-05-27 NOTE — Progress Notes (Signed)
Office Visit Note   Patient: Bonnie Juarez           Date of Birth: Jun 25, 1945           MRN: [id number] Visit Date: 05/27/2019              Requested by: Cari Caraway, [place],  [place] [address] PCP: Cari Caraway, MD   Assessment & Plan: Visit Diagnoses:  1. Chondromalacia patellae, left knee   2. Chondromalacia patellae, right knee   3. Bilateral primary osteoarthritis of knee   4. Plantar fasciitis of left foot     Plan: Right and left intra-articular knee injections performed which she tolerated well.  She will let us know if she has persistent problems.  Follow-Up Instructions: No follow-ups on file.   Orders:  Orders Placed This Encounter  Procedures  . Large Joint Inj: L knee  . Large Joint Inj: R knee   No orders of the defined types were placed in this encounter.     Procedures: Large Joint Inj: L knee on 05/27/2019 11:11 AM Indications: joint swelling and pain Details: 22 G 1.5 in needle, anterolateral approach  Arthrogram: No  Medications: 0.5 mL lidocaine 1 %; 3 mL bupivacaine 0.5 %; 40 mg methylPREDNISolone acetate 40 MG/ML Outcome: tolerated well, no immediate complications Procedure, treatment alternatives, risks and benefits explained, specific risks discussed. Consent was given by the patient. Immediately prior to procedure a time out was called to verify the correct patient, procedure, equipment, support staff and site/side marked as required. Patient was prepped and draped in the usual sterile fashion.   Large Joint Inj: R knee on 05/27/2019 11:11 AM Indications: pain and joint swelling Details: 22 G 1.5 in needle, anterolateral approach  Arthrogram: No  Medications: 40 mg methylPREDNISolone acetate 40 MG/ML; 0.5 mL lidocaine 1 %; 4 mL bupivacaine 0.25 % Outcome: tolerated well, no immediate complications Procedure, treatment alternatives, risks and benefits explained, specific risks discussed. Consent was given by the  patient. Immediately prior to procedure a time out was called to verify the correct patient, procedure, equipment, support staff and site/side marked as required. Patient was prepped and draped in the usual sterile fashion.       Clinical Data: No additional findings.   Subjective: Chief Complaint  Patient presents with  . Left Knee - Pain  . Right Knee - Pain    HPI 74 year old female returns with bilateral knee pain swelling and aching.  Previous cortisone injection in September 2020 which did well.  Later [place] injections in November did not give her much relief.  She has been going to physical therapy for treatment of plantar fasciitis and feels like her knees have gotten worse the last 2 months and is requesting right and left knee injection.  Patient states therapy has been very helpful for plantar fascia.  She is using ibuprofen as well as some CBD oil topically.  Review of Systems 14 point update unchanged from previous office visit other than as mentioned in HPI.   Objective: Vital Signs: BP 139/71   Pulse 60   Ht 5' 6.25" (1.683 m)   Wt 176 lb (79.8 kg)   BMI 28.19 kg/m   Physical Exam Constitutional:      Appearance: She is well-developed.  HENT:     Head: Normocephalic.     Right Ear: External ear normal.     Left Ear: External ear normal.  Eyes:     Pupils: Pupils  are equal, round, and reactive to light.  Neck:     Thyroid: No thyromegaly.     Trachea: No tracheal deviation.  Cardiovascular:     Rate and Rhythm: Normal rate.  Pulmonary:     Effort: Pulmonary effort is normal.  Abdominal:     Palpations: Abdomen is soft.  Skin:    General: Skin is warm and dry.  Neurological:     Mental Status: She is alert and oriented to person, place, and time.  Psychiatric:        Behavior: Behavior normal.     Ortho Exam patient has more right knee crepitus and left knee crepitus with extension pain with patellofemoral loading.  Well-healed arthroscopic portal  scars right knee.  ACL PCL exam is normal.  Distal pulses are 2 logroll to the hips.  Specialty Comments:  No specialty comments available.  Imaging: No results found.   PMFS History: Patient Active Problem List   Diagnosis Date Noted  . Plantar fasciitis of left foot 10/02/2018  . S/P lumbar fusion 02/06/2018  . Mixed dyslipidemia 11/13/2017  . Family history of heart disease 11/13/2017  . Statin intolerance 11/13/2017  . Bilateral primary osteoarthritis of knee 10/30/2017  . S/P cervical spinal fusion 12/20/2016  . Spondylosis without myelopathy or radiculopathy, lumbar region 10/05/2016  . Chronic bilateral low back pain without sciatica 10/05/2016  . Fibromyalgia 10/05/2016  . Closed fracture of right proximal humerus 01/08/2013    Class: Acute   Past Medical History:  Diagnosis Date  . Anxiety   . Arthritis   . Cancer ([place])    Hx: of squamouscell carcinoma on Left shin basal cell on face; melanoma on right lower leg  . Complication of anesthesia    November 27, 2003 deceased LOC and hypoxia overmedicating with Morphine and Phenergan; received Narcan X 2, overnight pulse ox  . Depression   . Fibromyalgia   . Frequent urination   . GERD (gastroesophageal reflux disease)    Hx: of  . H/O hiatal hernia    PMH: of  . Headache(784.0)   . Hypertension   . IBS (irritable bowel syndrome)    Hx: of  . Osteopenia    Hx: of per pt, pt. adds osteoporosis- DOS  . Pneumonia    hx  . PONV (postoperative nausea and vomiting)   . Sleep apnea    Hx: of in the past-?mild apnea 10 yrs ago- followed by dr and none found    Family History  Problem Relation Age of Onset  . Heart disease Other   . Lung disease Other     Past Surgical History:  Procedure Laterality Date  . APPENDECTOMY    . ARCUATE KERATECTOMY    . BREAST LUMPECTOMY Right 1991  . CERVICAL FUSION  1983  . FRACTURE SURGERY Right 2014   humerus  . HERNIA REPAIR     hiatal  . KNEE ARTHROSCOPY Right 2015  . [place] PLACATION  2010  . ORIF HUMERUS FRACTURE Right 01/08/2013   Procedure: OPEN REDUCTION INTERNAL FIXATION (ORIF) PROXIMAL HUMERUS FRACTURE;  Surgeon: Marybelle Killings, MD;  Location: [place];  Service: Orthopedics;  Laterality: Right;  Open Reduction Internal Fixation Right Proximal Humerus Fracture  . POSTERIOR CERVICAL FUSION/FORAMINOTOMY N/A 12/20/2016   Procedure: Posterior Cervical Fusion with lateral mass fixation - Cervical Three-Thoracic Two;  Surgeon: Eustace Moore, MD;  Location: [place];  Service: Neurosurgery;  Laterality: N/A;  . RADIAL HEAD REPLACEMENT  2005  . trigger thrumb  release Left 2001  . tubular plasity  1970   Social History   Occupational History  . Not on file  Tobacco Use  . Smoking status: Former Smoker    Packs/day: 1.00    Years: 15.00    Pack years: 15.00    Types: Cigarettes    Quit date: 12/12/1980    Years since quitting: 38.4  . Smokeless tobacco: Never Used  . Tobacco comment: Quit 1982"  Substance and Sexual Activity  . Alcohol use: Yes    Alcohol/week: 7.0 - 10.0 standard drinks    Types: 7 - 10 Glasses of wine per week  . Drug use: No  . Sexual activity: Not on file

## 2019-05-29 ENCOUNTER — Other Ambulatory Visit: Payer: Self-pay

## 2019-05-29 ENCOUNTER — Ambulatory Visit: Payer: PPO | Admitting: Physical Therapy

## 2019-05-29 DIAGNOSIS — R2689 Other abnormalities of gait and mobility: Secondary | ICD-10-CM

## 2019-05-29 DIAGNOSIS — M545 Low back pain, unspecified: Secondary | ICD-10-CM

## 2019-05-29 DIAGNOSIS — M79671 Pain in right foot: Secondary | ICD-10-CM

## 2019-05-29 DIAGNOSIS — G8929 Other chronic pain: Secondary | ICD-10-CM | POA: Diagnosis not present

## 2019-05-29 DIAGNOSIS — M25561 Pain in right knee: Secondary | ICD-10-CM

## 2019-05-29 DIAGNOSIS — M25562 Pain in left knee: Secondary | ICD-10-CM | POA: Diagnosis not present

## 2019-05-29 DIAGNOSIS — M79672 Pain in left foot: Secondary | ICD-10-CM

## 2019-05-29 NOTE — Therapy (Addendum)
[facility] [place], [place], [address] Phone: [phone]   Fax:  [phone]  Physical Therapy Treatment  Patient Details  Name: Bonnie Juarez MRN: [id number] Date of Birth: 30-Jul-1945 Referring Provider (PT): Marybelle Killings, MD   Encounter Date: 05/29/2019  PT End of Session - 05/29/19 1013    Visit Number  8    Number of Visits  12    Date for PT Re-Evaluation  06/02/19    PT Start Time  0935    PT Stop Time  1015    PT Time Calculation (min)  40 min    Activity Tolerance  Patient tolerated treatment well    Behavior During Therapy  [facility] for tasks assessed/performed       Past Medical History:  Diagnosis Date  . Anxiety   . Arthritis   . Cancer ([place])    Hx: of squamouscell carcinoma on Left shin basal cell on face; melanoma on right lower leg  . Complication of anesthesia    12/02/2003 deceased LOC and hypoxia overmedicating with Morphine and Phenergan; received Narcan X 2, overnight pulse ox  . Depression   . Fibromyalgia   . Frequent urination   . GERD (gastroesophageal reflux disease)    Hx: of  . H/O hiatal hernia    PMH: of  . Headache(784.0)   . Hypertension   . IBS (irritable bowel syndrome)    Hx: of  . Osteopenia    Hx: of per pt, pt. adds osteoporosis- DOS  . Pneumonia    hx  . PONV (postoperative nausea and vomiting)   . Sleep apnea    Hx: of in the past-?mild apnea 10 yrs ago- followed by dr and none found    Past Surgical History:  Procedure Laterality Date  . APPENDECTOMY    . ARCUATE KERATECTOMY    . BREAST LUMPECTOMY Right 1991  . CERVICAL FUSION  1983  . FRACTURE SURGERY Right 2014   humerus  . HERNIA REPAIR     hiatal  . KNEE ARTHROSCOPY Right 2015  . [place] PLACATION  2010  . ORIF HUMERUS FRACTURE Right 01/08/2013   Procedure: OPEN REDUCTION INTERNAL FIXATION (ORIF) PROXIMAL HUMERUS FRACTURE;  Surgeon: Marybelle Killings, MD;  Location: [place];  Service: Orthopedics;  Laterality: Right;   Open Reduction Internal Fixation Right Proximal Humerus Fracture  . POSTERIOR CERVICAL FUSION/FORAMINOTOMY N/A 12/20/2016   Procedure: Posterior Cervical Fusion with lateral mass fixation - Cervical Three-Thoracic Two;  Surgeon: Eustace Moore, MD;  Location: [place];  Service: Neurosurgery;  Laterality: N/A;  . RADIAL HEAD REPLACEMENT  2005  . trigger thrumb release Left 2001  . tubular plasity  1970    There were no vitals filed for this visit.  Subjective Assessment - 05/29/19 1012    Subjective  relays she feels much better today, less overall pain 2/10 in her back and feet, but has also been taking NSAIDS   Pertinent History  PMH: lumbar and cervical fusion, fibromyalgia,knee OA    Limitations  Walking;Standing;House hold activities    How long can you stand comfortably?  depends    How long can you walk comfortably?  depends    Diagnostic tests  XR show previous lumbar fusion, and knee OA    Patient Stated Goals  reduce the pain    Pain Onset  More than a month ago         [facility] PT Assessment - 05/29/19 0001  Assessment   Medical Diagnosis  bilat foot plantar fasciitis, chronic LBP. knee OA    Referring Provider (PT)  Marybelle Killings, MD      Strength   Overall Strength Comments  bilat knee strength 5/5, Rt hip strength overall improved to 4+/5                   [facility] Adult PT Treatment/Exercise - 05/29/19 0001      Exercises   Other Exercises   UBE UE/LE warm up for ROM and endurance L2 X 9 min with heat to her back, Slantboard stretch for gastroc 30 sec X 3, then soleus 30 sec X 3, Heel raises off step 2X10 bilat with one UE support, supine SKTC 30 sec X 2 bilat, supine bridge X 15 reps hold 5 sec,  supine clams green 2X10, supine marches with TAC  X15 bilat, seated rows with green 2X10, seated LAQ and marches on pball  x 10 ea                  PT Long Term Goals - 05/12/19 1330      PT LONG TERM GOAL #1   Title  Pt will be I and compliant  with HEP and verbalize plan for continued exercise post PT discharge. (Target goal for all goals 6 weeks 4/521)    Status  On-going      PT LONG TERM GOAL #2   Title  Pt will improve bilat leg strength grossly to at least 4+/5 MMT to improve function    Status  On-going      PT LONG TERM GOAL #3   Title  Pt will improve FGA to 24 or greater to show improved dynamic balance    Status  On-going      PT LONG TERM GOAL #4   Title  Pt will report overall less than 4/10 pain with usual activity including walking and standing up to 30 minutes    Status  On-going            Plan - 05/29/19 1014    Clinical Impression Statement  Had less pain and able to progress her program without complaints. Activity tolerance is improving overall. Her leg strength has made good progress, see updated measurements.    Personal Factors and Comorbidities  Comorbidity 2    Comorbidities  PMH: lumbar and cervical fusion, fibromyalgia,knee OA    Examination-Activity Limitations  Lift;Stand;Stairs;Squat;Sleep;Carry;Locomotion Level    Examination-Participation Restrictions  Cleaning;Community Activity;Laundry;Shop    Stability/Clinical Decision Making  Evolving/Moderate complexity    Rehab Potential  Good    PT Frequency  2x / week    PT Duration  6 weeks    PT Treatment/Interventions  ADLs/Self Care Home Management;Aquatic Therapy;Cryotherapy;English as a second language teacher;Therapeutic activities;Therapeutic exercise;Balance training;Neuromuscular re-education;Patient/family education;Orthotic Fit/Training;Manual techniques;Dry needling;Passive range of motion;Taping    PT Next Visit Plan  review and update HEP PRN. consider modalaties and manual for pain, may benefit from aquatic if land PT does not help. needs bilat heel cord stretching, lumbar stretching, lumbar/LE strength, dynamic balance    PT Home Exercise Plan  Access Code: [email or web address], added rows and clams with  green    Consulted and Agree with Plan of Care  Patient       Patient will benefit from skilled therapeutic intervention in order to improve the following deficits and impairments:  Abnormal gait, Decreased activity tolerance, Decreased balance, Decreased endurance, Decreased mobility, Decreased range of motion, Decreased strength,  Difficulty walking, Increased fascial restricitons, Impaired flexibility, Postural dysfunction, Pain  Visit Diagnosis: Chronic bilateral low back pain without sciatica  Pain in right foot  Pain in left foot  Other abnormalities of gait and mobility  Chronic pain of left knee  Chronic pain of right knee     Problem List Patient Active Problem List   Diagnosis Date Noted  . Plantar fasciitis of left foot 10/02/2018  . S/P lumbar fusion 02/06/2018  . Mixed dyslipidemia 11/13/2017  . Family history of heart disease 11/13/2017  . Statin intolerance 11/13/2017  . Bilateral primary osteoarthritis of knee 10/30/2017  . S/P cervical spinal fusion 12/20/2016  . Spondylosis without myelopathy or radiculopathy, lumbar region 10/05/2016  . Chronic bilateral low back pain without sciatica 10/05/2016  . Fibromyalgia 10/05/2016  . Closed fracture of right proximal humerus 01/08/2013    Class: Acute    Debbe Odea, PT,DPT 05/29/2019, 10:55 AM  [facility] Physical Therapy [address] [place], [place], [address] Phone: [phone]   Fax:  [phone]  Name: Bonnie Juarez MRN: [id number] Date of Birth: 1945-04-09

## 2019-06-04 ENCOUNTER — Other Ambulatory Visit: Payer: Self-pay

## 2019-06-04 ENCOUNTER — Ambulatory Visit: Payer: PPO | Admitting: Physical Therapy

## 2019-06-04 DIAGNOSIS — M25561 Pain in right knee: Secondary | ICD-10-CM | POA: Diagnosis not present

## 2019-06-04 DIAGNOSIS — G8929 Other chronic pain: Secondary | ICD-10-CM | POA: Diagnosis not present

## 2019-06-04 DIAGNOSIS — M25562 Pain in left knee: Secondary | ICD-10-CM | POA: Diagnosis not present

## 2019-06-04 DIAGNOSIS — M79672 Pain in left foot: Secondary | ICD-10-CM

## 2019-06-04 DIAGNOSIS — R2689 Other abnormalities of gait and mobility: Secondary | ICD-10-CM

## 2019-06-04 DIAGNOSIS — M545 Low back pain, unspecified: Secondary | ICD-10-CM

## 2019-06-04 DIAGNOSIS — M79671 Pain in right foot: Secondary | ICD-10-CM | POA: Diagnosis not present

## 2019-06-04 NOTE — Therapy (Addendum)
[facility] [address] [place], [place], [address] Phone: [phone]   Fax:  [phone]  Physical Therapy Treatment/Recert  Patient Details  Name: Bonnie Juarez MRN: [id number] Date of Birth: 02/19/1946 Referring Provider (PT): Yates, Mark C, MD   Encounter Date: 06/04/2019  PT End of Session - 06/04/19 1609    Visit Number  9    Number of Visits  12    Date for PT Re-Evaluation  06/20/19    Progress Note Due on Visit  10    Activity Tolerance  Patient tolerated treatment well    Behavior During Therapy  [facility] for tasks assessed/performed      PT Start Time 1530     PT Stop Time 1608     PT Time Calculation (min) 38 min        Past Medical History:  Diagnosis Date  . Anxiety   . Arthritis   . Cancer ([facility])    Hx: of squamouscell carcinoma on Left shin basal cell on face; melanoma on right lower leg  . Complication of anesthesia    10/2003 deceased LOC and hypoxia overmedicating with Morphine and Phenergan; received Narcan X 2, overnight pulse ox  . Depression   . Fibromyalgia   . Frequent urination   . GERD (gastroesophageal reflux disease)    Hx: of  . H/O hiatal hernia    PMH: of  . Headache(784.0)   . Hypertension   . IBS (irritable bowel syndrome)    Hx: of  . Osteopenia    Hx: of per pt, pt. adds osteoporosis- DOS  . Pneumonia    hx  . PONV (postoperative nausea and vomiting)   . Sleep apnea    Hx: of in the past-?mild apnea 10 yrs ago- followed by dr and none found    Past Surgical History:  Procedure Laterality Date  . APPENDECTOMY    . ARCUATE KERATECTOMY    . BREAST LUMPECTOMY Right 1991  . CERVICAL FUSION  1983  . FRACTURE SURGERY Right 2014   humerus  . HERNIA REPAIR     hiatal  . KNEE ARTHROSCOPY Right 2015  . [place] PLACATION  2010  . ORIF HUMERUS FRACTURE Right 01/08/2013   Procedure: OPEN REDUCTION INTERNAL FIXATION (ORIF) PROXIMAL HUMERUS FRACTURE;  Surgeon: Mark C Yates, MD;  [facility];   Service: Orthopedics;  Laterality: Right;  Open Reduction Internal Fixation Right Proximal Humerus Fracture  . POSTERIOR CERVICAL FUSION/FORAMINOTOMY N/A 12/20/2016   Procedure: Posterior Cervical Fusion with lateral mass fixation - Cervical Three-Thoracic Two;  Surgeon: Jones, David S, MD;  Location: [facility];  Service: Neurosurgery;  Laterality: N/A;  . RADIAL HEAD REPLACEMENT  2005  . trigger thrumb release Left 2001  . tubular plasity  1970    There were no vitals filed for this visit.  Subjective Assessment - 06/04/19 1608    Subjective  relays the pain is not too bad today only 1-2/10 and has been able to do a little more activity    Pertinent History  PMH: lumbar and cervical fusion, fibromyalgia,knee OA    Limitations  Walking;Standing;House hold activities    How long can you stand comfortably?  depends    How long can you walk comfortably?  depends    Diagnostic tests  XR show previous lumbar fusion, and knee OA    Patient Stated Goals  reduce the pain    Pain Onset  More than a month ago          of left foot 10/02/2018  . S/P lumbar fusion 02/06/2018  . Mixed dyslipidemia 11/13/2017  . Family history of heart disease 11/13/2017  . Statin intolerance 11/13/2017  . Bilateral primary osteoarthritis of knee 10/30/2017  . S/P cervical spinal fusion 12/20/2016  . Spondylosis without myelopathy or radiculopathy, lumbar region 10/05/2016  . Chronic bilateral low back pain without sciatica 10/05/2016  . Fibromyalgia 10/05/2016  . Closed fracture of right proximal humerus 01/08/2013    Class: Acute     R , PT,DPT 06/04/2019, 4:34 PM  [facility] [address] [place], [place], [address] Phone: [phone]   Fax:  [phone]  Name: Bonnie Juarez MRN: [id number] Date of Birth: 10/29/1945     of left foot 10/02/2018  . S/P lumbar fusion 02/06/2018  . Mixed dyslipidemia 11/13/2017  . Family history of heart disease 11/13/2017  . Statin intolerance 11/13/2017  . Bilateral primary osteoarthritis of knee 10/30/2017  . S/P cervical spinal fusion 12/20/2016  . Spondylosis without myelopathy or radiculopathy, lumbar region 10/05/2016  . Chronic bilateral low back pain without sciatica 10/05/2016  . Fibromyalgia 10/05/2016  . Closed fracture of right proximal humerus 01/08/2013    Class: Acute    Brian R Nelson, PT,DPT 06/04/2019, 4:34 PM  [facility] [address] [place], [place], [address] Phone: [phone]   Fax:  [phone]  Name: Bonnie Juarez MRN: [id number] Date of Birth: 10/29/1945   

## 2019-06-06 ENCOUNTER — Other Ambulatory Visit: Payer: Self-pay

## 2019-06-06 ENCOUNTER — Ambulatory Visit (INDEPENDENT_AMBULATORY_CARE_PROVIDER_SITE_OTHER): Payer: PPO | Admitting: Physical Therapy

## 2019-06-06 DIAGNOSIS — M79671 Pain in right foot: Secondary | ICD-10-CM | POA: Diagnosis not present

## 2019-06-06 DIAGNOSIS — M79672 Pain in left foot: Secondary | ICD-10-CM

## 2019-06-06 DIAGNOSIS — G8929 Other chronic pain: Secondary | ICD-10-CM | POA: Diagnosis not present

## 2019-06-06 DIAGNOSIS — M545 Low back pain, unspecified: Secondary | ICD-10-CM

## 2019-06-06 DIAGNOSIS — R2689 Other abnormalities of gait and mobility: Secondary | ICD-10-CM

## 2019-06-06 DIAGNOSIS — M25562 Pain in left knee: Secondary | ICD-10-CM

## 2019-06-06 DIAGNOSIS — M25561 Pain in right knee: Secondary | ICD-10-CM | POA: Diagnosis not present

## 2019-06-06 NOTE — Therapy (Addendum)
[facility] Physical Therapy [address] [place], [place], [address] Phone: [phone]   Fax:  [phone]  Physical Therapy Treatment/Progress note Progress Note reporting period 04/21/19 to 06/06/19  See below for objective and subjective measurements relating to patients progress with PT.   Patient Details  Name: Bonnie Juarez MRN: [id number] Date of Birth: Jan 17, 1946 Referring Provider (PT): Marybelle Killings, MD   Encounter Date: 06/06/2019  PT End of Session - 06/06/19 1513    Visit Number  10    Number of Visits  12    Date for PT Re-Evaluation  06/20/19    Progress Note Due on Visit  20    PT Start Time  0051    PT Stop Time  1525    PT Time Calculation (min)  40 min    Activity Tolerance  Patient tolerated treatment well    Behavior During Therapy  [facility] for tasks assessed/performed       Past Medical History:  Diagnosis Date  . Anxiety   . Arthritis   . Cancer ([place])    Hx: of squamouscell carcinoma on Left shin basal cell on face; melanoma on right lower leg  . Complication of anesthesia    Dec 19, 2003 deceased LOC and hypoxia overmedicating with Morphine and Phenergan; received Narcan X 2, overnight pulse ox  . Depression   . Fibromyalgia   . Frequent urination   . GERD (gastroesophageal reflux disease)    Hx: of  . H/O hiatal hernia    PMH: of  . Headache(784.0)   . Hypertension   . IBS (irritable bowel syndrome)    Hx: of  . Osteopenia    Hx: of per pt, pt. adds osteoporosis- DOS  . Pneumonia    hx  . PONV (postoperative nausea and vomiting)   . Sleep apnea    Hx: of in the past-?mild apnea 10 yrs ago- followed by dr and none found    Past Surgical History:  Procedure Laterality Date  . APPENDECTOMY    . ARCUATE KERATECTOMY    . BREAST LUMPECTOMY Right 1991  . CERVICAL FUSION  1983  . FRACTURE SURGERY Right 2014   humerus  . HERNIA REPAIR     hiatal  . KNEE ARTHROSCOPY Right 2015  . [place] PLACATION  2010  . ORIF HUMERUS  FRACTURE Right 01/08/2013   Procedure: OPEN REDUCTION INTERNAL FIXATION (ORIF) PROXIMAL HUMERUS FRACTURE;  Surgeon: Marybelle Killings, MD;  Location: [place];  Service: Orthopedics;  Laterality: Right;  Open Reduction Internal Fixation Right Proximal Humerus Fracture  . POSTERIOR CERVICAL FUSION/FORAMINOTOMY N/A 12/20/2016   Procedure: Posterior Cervical Fusion with lateral mass fixation - Cervical Three-Thoracic Two;  Surgeon: Eustace Moore, MD;  Location: [place];  Service: Neurosurgery;  Laterality: N/A;  . RADIAL HEAD REPLACEMENT  2005  . trigger thrumb release Left 2001  . tubular plasity  1970    There were no vitals filed for this visit.  Subjective Assessment - 06/06/19 1448    Subjective  relays her back and feet feel pretty good but her Lt knee is aggravated.    Pertinent History  PMH: lumbar and cervical fusion, fibromyalgia,knee OA    Limitations  Walking;Standing;House hold activities    How long can you stand comfortably?  depends    How long can you walk comfortably?  depends    Diagnostic tests  XR show previous lumbar fusion, and knee OA    Patient Stated Goals  reduce the pain  Pain Onset  More than a month ago         [facility] PT Assessment - 06/06/19 0001      Assessment   Medical Diagnosis  bilat foot plantar fasciitis, chronic LBP. knee OA    Referring Provider (PT)  Marybelle Killings, MD      AROM   Lumbar Flexion  75%    Lumbar Extension  [facility]    Lumbar - Right Side Bend  [facility]    Lumbar - Left Side Bend  [facility]    Lumbar - Right Rotation  [facility]    Lumbar - Left Rotation  [facility]      Strength   Overall Strength Comments  bilat knee strength 5/5, bilat hip strength overall improved to 4+/5.  bilat ankle DF 5/5 all tested in sitting                   [facility] Adult PT Treatment/Exercise - 06/06/19 0001      Exercises   Other Exercises   UBE UE/LE warm up for ROM and endurance L2 X 8:45 (1 mile)  Slantboard stretch for gastroc 30 sec X 3, then soleus 30 sec X 3,  Heel raises off step 2X10 bilat with one UE support. Step ups 6 inch X 10 bilat, mini squats with UE support  X15 reps, supine bridge 2X10 reps hold 5 sec,  supine clams green 2X10, supine marches with TAC  X15 bilat, standing rows and extensions with green X 20 ea, bilat ER with red X 15      Manual Therapy   Manual therapy comments  manual heel cord stretching and plantarfascia stretching, ankle PROM bilat, STM/IASTM to plantarfascia bilat                  PT Long Term Goals - 06/06/19 1514      PT LONG TERM GOAL #1   Title  Pt will be I and compliant with HEP and verbalize plan for continued exercise post PT discharge. (Target goal for all goals 6 weeks 4/521)    Status  Achieved      PT LONG TERM GOAL #2   Title  Pt will improve bilat leg strength grossly to at least 4+/5 MMT to improve function    Status  Achieved      PT LONG TERM GOAL #3   Title  Pt will improve FGA to 24 or greater to show improved dynamic balance    Baseline  will assess next visit    Status  On-going      PT LONG TERM GOAL #4   Title  Pt will report overall less than 4/10 pain with usual activity including walking and standing up to 30 minutes    Status  On-going            Plan - 06/06/19 1613    Clinical Impression Statement  She feels she has made 75% progress since starting PT. She demonstrated improved overall leg strength and lumbar ROM today upon reassesment, see above measurements. She has met 2/4 PT goals. She will coninue to benefit from PT to address her deficits.    Personal Factors and Comorbidities  Comorbidity 2    Comorbidities  PMH: lumbar and cervical fusion, fibromyalgia,knee OA    Examination-Activity Limitations  Lift;Stand;Stairs;Squat;Sleep;Carry;Locomotion Level    Examination-Participation Restrictions  Cleaning;Community Activity;Laundry;Shop    Stability/Clinical Decision Making  Evolving/Moderate complexity    Rehab Potential  Good    PT  Frequency  2x / week     PT Duration  6 weeks    PT Treatment/Interventions  ADLs/Self Care Home Management;Aquatic Therapy;Cryotherapy;English as a second language teacher;Therapeutic activities;Therapeutic exercise;Balance training;Neuromuscular re-education;Patient/family education;Orthotic Fit/Training;Manual techniques;Dry needling;Passive range of motion;Taping    PT Next Visit Plan  review and update HEP PRN. consider modalaties and manual for pain, may benefit from aquatic if land PT does not help. needs bilat heel cord stretching, lumbar stretching, lumbar/LE strength, dynamic balance    PT Home Exercise Plan  Access Code: [email or web address], added rows and clams with green    Consulted and Agree with Plan of Care  Patient       Patient will benefit from skilled therapeutic intervention in order to improve the following deficits and impairments:  Abnormal gait, Decreased activity tolerance, Decreased balance, Decreased endurance, Decreased mobility, Decreased range of motion, Decreased strength, Difficulty walking, Increased fascial restricitons, Impaired flexibility, Postural dysfunction, Pain  Visit Diagnosis: Chronic bilateral low back pain without sciatica  Pain in right foot  Pain in left foot  Other abnormalities of gait and mobility  Chronic pain of left knee  Chronic pain of right knee     Problem List Patient Active Problem List   Diagnosis Date Noted  . Plantar fasciitis of left foot 10/02/2018  . S/P lumbar fusion 02/06/2018  . Mixed dyslipidemia 11/13/2017  . Family history of heart disease 11/13/2017  . Statin intolerance 11/13/2017  . Bilateral primary osteoarthritis of knee 10/30/2017  . S/P cervical spinal fusion 12/20/2016  . Spondylosis without myelopathy or radiculopathy, lumbar region 10/05/2016  . Chronic bilateral low back pain without sciatica 10/05/2016  . Fibromyalgia 10/05/2016  . Closed fracture of right proximal humerus 01/08/2013     Class: Acute    Silvestre Mesi 06/06/2019, 4:28 PM  [facility] Physical Therapy [address] [place], [place], [address] Phone: [phone]   Fax:  [phone]  Name: SHARIECE VIVEIROS MRN: [id number] Date of Birth: Jun 10, 1945

## 2019-06-09 ENCOUNTER — Other Ambulatory Visit: Payer: Self-pay

## 2019-06-09 ENCOUNTER — Ambulatory Visit: Payer: PPO | Admitting: Physical Therapy

## 2019-06-09 DIAGNOSIS — M25561 Pain in right knee: Secondary | ICD-10-CM

## 2019-06-09 DIAGNOSIS — G8929 Other chronic pain: Secondary | ICD-10-CM

## 2019-06-09 DIAGNOSIS — M79671 Pain in right foot: Secondary | ICD-10-CM | POA: Diagnosis not present

## 2019-06-09 DIAGNOSIS — M25562 Pain in left knee: Secondary | ICD-10-CM | POA: Diagnosis not present

## 2019-06-09 DIAGNOSIS — M79672 Pain in left foot: Secondary | ICD-10-CM | POA: Diagnosis not present

## 2019-06-09 DIAGNOSIS — M545 Low back pain, unspecified: Secondary | ICD-10-CM

## 2019-06-09 DIAGNOSIS — R2689 Other abnormalities of gait and mobility: Secondary | ICD-10-CM | POA: Diagnosis not present

## 2019-06-09 NOTE — Therapy (Addendum)
[place], [place], [address] Phone: [phone]   Fax:  [phone]  Physical Therapy Treatment  Patient Details  Name: Bonnie Juarez MRN: [id number] Date of Birth: 12-Nov-1945 Referring Provider (PT): Marybelle Killings, MD   Encounter Date: 06/09/2019  PT End of Session - 06/09/19 1344    Visit Number  11    Number of Visits  12    Date for PT Re-Evaluation  06/20/19    Progress Note Due on Visit  20    PT Start Time  1150    PT Stop Time  [id number]    PT Time Calculation (min)  45 min    Activity Tolerance  Patient tolerated treatment well    Behavior During Therapy  [facility] for tasks assessed/performed       Past Medical History:  Diagnosis Date  . Anxiety   . Arthritis   . Cancer ([place])    Hx: of squamouscell carcinoma on Left shin basal cell on face; melanoma on right lower leg  . Complication of anesthesia    11/27/2003 deceased LOC and hypoxia overmedicating with Morphine and Phenergan; received Narcan X 2, overnight pulse ox  . Depression   . Fibromyalgia   . Frequent urination   . GERD (gastroesophageal reflux disease)    Hx: of  . H/O hiatal hernia    PMH: of  . Headache(784.0)   . Hypertension   . IBS (irritable bowel syndrome)    Hx: of  . Osteopenia    Hx: of per pt, pt. adds osteoporosis- DOS  . Pneumonia    hx  . PONV (postoperative nausea and vomiting)   . Sleep apnea    Hx: of in the past-?mild apnea 10 yrs ago- followed by dr and none found    Past Surgical History:  Procedure Laterality Date  . APPENDECTOMY    . ARCUATE KERATECTOMY    . BREAST LUMPECTOMY Right 1991  . CERVICAL FUSION  1983  . FRACTURE SURGERY Right 2014   humerus  . HERNIA REPAIR     hiatal  . KNEE ARTHROSCOPY Right 2015  . [place] PLACATION  2010  . ORIF HUMERUS FRACTURE Right 01/08/2013   Procedure: OPEN REDUCTION INTERNAL FIXATION (ORIF) PROXIMAL HUMERUS FRACTURE;  Surgeon: Marybelle Killings, MD;  Location: [place];   Service: Orthopedics;  Laterality: Right;  Open Reduction Internal Fixation Right Proximal Humerus Fracture  . POSTERIOR CERVICAL FUSION/FORAMINOTOMY N/A 12/20/2016   Procedure: Posterior Cervical Fusion with lateral mass fixation - Cervical Three-Thoracic Two;  Surgeon: Eustace Moore, MD;  Location: [place];  Service: Neurosurgery;  Laterality: N/A;  . RADIAL HEAD REPLACEMENT  2005  . trigger thrumb release Left 2001  . tubular plasity  1970    There were no vitals filed for this visit.  Subjective Assessment - 06/09/19 1204    Subjective  relays she may have overdid it with activity on friday, she was flared up some over the weekend but some better now. Rates overall 5/10   Currently in Pain?  Yes    Pain Location  Back   and legs       Assessment    Medical Diagnosis  bilat foot plantar fasciitis, chronic LBP. knee OA    Referring Provider (PT)  Marybelle Killings, MD        AROM   Lumbar Flexion  75%    Lumbar Extension  [facility]    Lumbar - Right Side Bend  [facility]    Lumbar - Left Side Bend  [facility]    Lumbar - Right Rotation  [facility]    Lumbar - Left Rotation  [facility]        Strength   Overall Strength Comments  bilat knee strength 5/5, bilat hip strength overall improved to 4+/5.  bilat ankle DF 5/5 all tested in sitting       [facility] Adult PT Treatment/Exercise - 06/09/19 0001      Exercises   Other Exercises   UBE UE/LE warm up for ROM and endurance L2 X 8:45 (1 mile)  Slantboard stretch for gastroc 30 sec X 3, then soleus 30 sec X 3, Heel raises off step 2X10 bilat with one UE support.  mini squats with UE support  X10 reps, standing hip 3 way (march, abd, ext) with red band X 15 ea bilat in standing.  supine bridge 2X10 reps hold 5 sec,  supine clams green 2X15 with contralateral isometric,      Manual Therapy   Manual therapy comments  manual heel cord stretching and plantarfascia stretching, ankle PROM bilat, STM/IASTM to plantarfascia bilat                  PT  Long Term Goals - 06/06/19 1514      PT LONG TERM GOAL #1   Title  Pt will be I and compliant with HEP and verbalize plan for continued exercise post PT discharge. (Target goal for all goals 6 weeks 4/521)    Status  Achieved      PT LONG TERM GOAL #2   Title  Pt will improve bilat leg strength grossly to at least 4+/5 MMT to improve function    Status  Achieved      PT LONG TERM GOAL #3   Title  Pt will improve FGA to 24 or greater to show improved dynamic balance    Baseline  will assess next visit    Status  On-going      PT LONG TERM GOAL #4   Title  Pt will report overall less than 4/10 pain with usual activity including walking and standing up to 30 minutes    Status  On-going            Plan - 06/09/19 1345    Clinical Impression Statement  She was sore last time so backed off of standing strengthening exercises some today for improved activity tolerance. She will need recert next visit.    Personal Factors and Comorbidities  Comorbidity 2    Comorbidities  PMH: lumbar and cervical fusion, fibromyalgia,knee OA    Examination-Activity Limitations  Lift;Stand;Stairs;Squat;Sleep;Carry;Locomotion Level    Examination-Participation Restrictions  Cleaning;Community Activity;Laundry;Shop    Stability/Clinical Decision Making  Evolving/Moderate complexity    Rehab Potential  Good    PT Frequency  2x / week    PT Duration  6 weeks    PT Treatment/Interventions  ADLs/Self Care Home Management;Aquatic Therapy;Cryotherapy;English as a second language teacher;Therapeutic activities;Therapeutic exercise;Balance training;Neuromuscular re-education;Patient/family education;Orthotic Fit/Training;Manual techniques;Dry needling;Passive range of motion;Taping    PT Next Visit Plan  review and update HEP PRN. consider modalaties and manual for pain, may benefit from aquatic if land PT does not help. needs bilat heel cord stretching, lumbar stretching,  lumbar/LE strength, dynamic balance    PT Home Exercise Plan  Access Code: [email or web address], added rows and clams with green    Consulted and Agree with Plan of Care  Patient       Patient  will benefit from skilled therapeutic intervention in order to improve the following deficits and impairments:  Abnormal gait, Decreased activity tolerance, Decreased balance, Decreased endurance, Decreased mobility, Decreased range of motion, Decreased strength, Difficulty walking, Increased fascial restricitons, Impaired flexibility, Postural dysfunction, Pain  Visit Diagnosis: Chronic bilateral low back pain without sciatica  Pain in right foot  Pain in left foot  Other abnormalities of gait and mobility  Chronic pain of left knee  Chronic pain of right knee     Problem List Patient Active Problem List   Diagnosis Date Noted  . Plantar fasciitis of left foot 10/02/2018  . S/P lumbar fusion 02/06/2018  . Mixed dyslipidemia 11/13/2017  . Family history of heart disease 11/13/2017  . Statin intolerance 11/13/2017  . Bilateral primary osteoarthritis of knee 10/30/2017  . S/P cervical spinal fusion 12/20/2016  . Spondylosis without myelopathy or radiculopathy, lumbar region 10/05/2016  . Chronic bilateral low back pain without sciatica 10/05/2016  . Fibromyalgia 10/05/2016  . Closed fracture of right proximal humerus 01/08/2013    Class: Acute    Silvestre Mesi 06/09/2019, 1:46 PM  [facility] Physical Therapy [address] [place], [place], [address] Phone: [phone]   Fax:  [phone]  Name: AIRANNA HAINLINE MRN: [id number] Date of Birth: [birth year]

## 2019-06-10 DIAGNOSIS — F3342 Major depressive disorder, recurrent, in full remission: Secondary | ICD-10-CM | POA: Diagnosis not present

## 2019-06-10 DIAGNOSIS — E782 Mixed hyperlipidemia: Secondary | ICD-10-CM | POA: Diagnosis not present

## 2019-06-10 DIAGNOSIS — M810 Age-related osteoporosis without current pathological fracture: Secondary | ICD-10-CM | POA: Diagnosis not present

## 2019-06-10 DIAGNOSIS — F341 Dysthymic disorder: Secondary | ICD-10-CM | POA: Diagnosis not present

## 2019-06-10 DIAGNOSIS — I10 Essential (primary) hypertension: Secondary | ICD-10-CM | POA: Diagnosis not present

## 2019-06-10 DIAGNOSIS — F3341 Major depressive disorder, recurrent, in partial remission: Secondary | ICD-10-CM | POA: Diagnosis not present

## 2019-06-11 ENCOUNTER — Encounter: Payer: PPO | Admitting: Physical Therapy

## 2019-06-16 ENCOUNTER — Other Ambulatory Visit: Payer: Self-pay

## 2019-06-16 ENCOUNTER — Ambulatory Visit: Payer: PPO | Admitting: Physical Therapy

## 2019-06-16 DIAGNOSIS — M25561 Pain in right knee: Secondary | ICD-10-CM | POA: Diagnosis not present

## 2019-06-16 DIAGNOSIS — G8929 Other chronic pain: Secondary | ICD-10-CM | POA: Diagnosis not present

## 2019-06-16 DIAGNOSIS — M79672 Pain in left foot: Secondary | ICD-10-CM | POA: Diagnosis not present

## 2019-06-16 DIAGNOSIS — M79671 Pain in right foot: Secondary | ICD-10-CM | POA: Diagnosis not present

## 2019-06-16 DIAGNOSIS — R2689 Other abnormalities of gait and mobility: Secondary | ICD-10-CM | POA: Diagnosis not present

## 2019-06-16 DIAGNOSIS — M545 Low back pain, unspecified: Secondary | ICD-10-CM

## 2019-06-16 DIAGNOSIS — M25562 Pain in left knee: Secondary | ICD-10-CM

## 2019-06-16 NOTE — Therapy (Addendum)
[facility] Physical Therapy [address] [place], [place], [address] Phone: [phone]   Fax:  [phone]  Physical Therapy Treatment/Discharge Summary PHYSICAL THERAPY DISCHARGE SUMMARY   Visits from Start of Care: 12  Current functional level related to goals / functional outcomes: See below, met goals   Remaining deficits: Mild balance and strength deficits. But pleased with her functional level   Education / Equipment: HEP Plan: Patient agrees to discharge.  Patient goals were met. Patient is being discharged due to being pleased with the current functional level.  ?????       Patient Details  Name: LUKISHA PROCIDA MRN: [id number] Date of Birth: 1946-02-04 Referring Provider (PT): Marybelle Killings, MD   Encounter Date: 06/16/2019  PT End of Session - 06/16/19 1349    Visit Number  12    Number of Visits  12    Date for PT Re-Evaluation  06/20/19    Progress Note Due on Visit  20    PT Start Time  1150    PT Stop Time  1245    PT Time Calculation (min)  55 min    Activity Tolerance  Patient tolerated treatment well    Behavior During Therapy  [facility] for tasks assessed/performed       Past Medical History:  Diagnosis Date  . Anxiety   . Arthritis   . Cancer ([place])    Hx: of squamouscell carcinoma on Left shin basal cell on face; melanoma on right lower leg  . Complication of anesthesia    2003/12/11 deceased LOC and hypoxia overmedicating with Morphine and Phenergan; received Narcan X 2, overnight pulse ox  . Depression   . Fibromyalgia   . Frequent urination   . GERD (gastroesophageal reflux disease)    Hx: of  . H/O hiatal hernia    PMH: of  . Headache(784.0)   . Hypertension   . IBS (irritable bowel syndrome)    Hx: of  . Osteopenia    Hx: of per pt, pt. adds osteoporosis- DOS  . Pneumonia    hx  . PONV (postoperative nausea and vomiting)   . Sleep apnea    Hx: of in the past-?mild apnea 10 yrs ago- followed by dr and none found     Past Surgical History:  Procedure Laterality Date  . APPENDECTOMY    . ARCUATE KERATECTOMY    . BREAST LUMPECTOMY Right 1991  . CERVICAL FUSION  1983  . FRACTURE SURGERY Right 2014   humerus  . HERNIA REPAIR     hiatal  . KNEE ARTHROSCOPY Right 2015  . [place] PLACATION  2010  . ORIF HUMERUS FRACTURE Right 01/08/2013   Procedure: OPEN REDUCTION INTERNAL FIXATION (ORIF) PROXIMAL HUMERUS FRACTURE;  Surgeon: Marybelle Killings, MD;  Location: [place];  Service: Orthopedics;  Laterality: Right;  Open Reduction Internal Fixation Right Proximal Humerus Fracture  . POSTERIOR CERVICAL FUSION/FORAMINOTOMY N/A 12/20/2016   Procedure: Posterior Cervical Fusion with lateral mass fixation - Cervical Three-Thoracic Two;  Surgeon: Eustace Moore, MD;  Location: [place];  Service: Neurosurgery;  Laterality: N/A;  . RADIAL HEAD REPLACEMENT  2005  . trigger thrumb release Left 2001  . tubular plasity  1970    There were no vitals filed for this visit.  Subjective Assessment - 06/16/19 1343    Subjective  she relays she is feeling much better and feels ready for discharge to HEP. She is very pleased with her progress. Relays pain is overall less  than 2/10 in back and feet   Pertinent History  PMH: lumbar and cervical fusion, fibromyalgia,knee OA    Limitations  Walking;Standing;House hold activities    How long can you stand comfortably?  depends    How long can you walk comfortably?  depends    Diagnostic tests  XR show previous lumbar fusion, and knee OA    Patient Stated Goals  discharge today    Currently in Pain?  No/denies    Pain Onset  More than a month ago         [facility] PT Assessment - 06/16/19 0001      Assessment   Medical Diagnosis  bilat foot plantar fasciitis, chronic LBP. knee OA    Referring Provider (PT)  Marybelle Killings, MD      AROM   Lumbar Flexion  [facility]    Lumbar Extension  [facility]    Lumbar - Right Side Bend  [facility]    Lumbar - Left Side Bend  [facility]    Lumbar - Right Rotation   [facility]    Lumbar - Left Rotation  [facility]      Strength   Overall Strength Comments  bilat knee strength 5/5, bilat hip strength overall improved to 4+/5.  bilat ankle DF 5/5 all tested in sitting      Functional Gait  Assessment   Gait Level Surface  Walks 20 ft in less than 5.5 sec, no assistive devices, good speed, no evidence for imbalance, normal gait pattern, deviates no more than 6 in outside of the 12 in walkway width.    Change in Gait Speed  Able to smoothly change walking speed without loss of balance or gait deviation. Deviate no more than 6 in outside of the 12 in walkway width.    Gait with Horizontal Head Turns  Performs head turns smoothly with slight change in gait velocity (eg, minor disruption to smooth gait path), deviates 6-10 in outside 12 in walkway width, or uses an assistive device.    Gait with Vertical Head Turns  Performs head turns with no change in gait. Deviates no more than 6 in outside 12 in walkway width.    Gait and Pivot Turn  Pivot turns safely within 3 sec and stops quickly with no loss of balance.    Step Over Obstacle  Is able to step over 2 stacked shoe boxes taped together (9 in total height) without changing gait speed. No evidence of imbalance.    Gait with Narrow Base of Support  Ambulates 7-9 steps.    Gait with Eyes Closed  Walks 20 ft, slow speed, abnormal gait pattern, evidence for imbalance, deviates 10-15 in outside 12 in walkway width. Requires more than 9 sec to ambulate 20 ft.    Ambulating Backwards  Walks 20 ft, uses assistive device, slower speed, mild gait deviations, deviates 6-10 in outside 12 in walkway width.    Steps  Alternating feet, must use rail.    Total Score  24                   [facility] Adult PT Treatment/Exercise - 06/16/19 0001      Exercises   Exercises  Other Exercises    Other Exercises   UBE UE/LE warm up for ROM and endurance L2 X 8:10 (1 mile)  Slantboard stretch for gastroc 30 sec X 3, then soleus 30 sec X 3,   mini squats with UE support  X15 reps, standing hip  3 way (march, abd, ext) with red band X 15 ea bilat in standing. sidestepping with resistance red band around her knees 30 feet X 4 reps             PT Education - 06/16/19 1348    Education Details  updated and revised HEP    Person(s) Educated  Patient    Methods  Explanation;Verbal cues;Handout    Comprehension  Verbalized understanding;Returned demonstration          PT Long Term Goals - 06/16/19 1352      PT LONG TERM GOAL #1   Title  Pt will be I and compliant with HEP and verbalize plan for continued exercise post PT discharge. (Target goal for all goals 6 weeks 4/521)    Status  Achieved      PT LONG TERM GOAL #2   Title  Pt will improve bilat leg strength grossly to at least 4+/5 MMT to improve function    Status  Achieved      PT LONG TERM GOAL #3   Title  Pt will improve FGA to 24 or greater to show improved dynamic balance    Baseline  will assess next visit    Status  Achieved      PT LONG TERM GOAL #4   Title  Pt will report overall less than 4/10 pain with usual activity including walking and standing up to 30 minutes    Status  Partially Met            Plan - 06/16/19 1351    Clinical Impression Statement  She has made excellent progress with PT and her pain levels are more managed. She feels ready for discharge and had no further questions or concerns.    Personal Factors and Comorbidities  Comorbidity 2    Comorbidities  PMH: lumbar and cervical fusion, fibromyalgia,knee OA    Examination-Activity Limitations  Lift;Stand;Stairs;Squat;Sleep;Carry;Locomotion Level    Examination-Participation Restrictions  Cleaning;Community Activity;Laundry;Shop    Stability/Clinical Decision Making  Evolving/Moderate complexity    PT Treatment/Interventions  ADLs/Self Care Home Management;Aquatic Therapy;Cryotherapy;English as a second language teacher;Therapeutic  activities;Therapeutic exercise;Balance training;Neuromuscular re-education;Patient/family education;Orthotic Fit/Training;Manual techniques;Dry needling;Passive range of motion;Taping    PT Next Visit Plan  disharge today    PT Home Exercise Plan  Access Code: [email or web address]    Consulted and Agree with Plan of Care  Patient       Patient will benefit from skilled therapeutic intervention in order to improve the following deficits and impairments:  Abnormal gait, Decreased activity tolerance, Decreased balance, Decreased endurance, Decreased mobility, Decreased range of motion, Decreased strength, Difficulty walking, Increased fascial restricitons, Impaired flexibility, Postural dysfunction, Pain  Visit Diagnosis: Chronic bilateral low back pain without sciatica  Pain in right foot  Pain in left foot  Other abnormalities of gait and mobility  Chronic pain of left knee  Chronic pain of right knee     Problem List Patient Active Problem List   Diagnosis Date Noted  . Plantar fasciitis of left foot 10/02/2018  . S/P lumbar fusion 02/06/2018  . Mixed dyslipidemia 11/13/2017  . Family history of heart disease 11/13/2017  . Statin intolerance 11/13/2017  . Bilateral primary osteoarthritis of knee 10/30/2017  . S/P cervical spinal fusion 12/20/2016  . Spondylosis without myelopathy or radiculopathy, lumbar region 10/05/2016  . Chronic bilateral low back pain without sciatica 10/05/2016  . Fibromyalgia 10/05/2016  . Closed fracture of right proximal humerus 01/08/2013    Class: Acute  Debbe Odea, PT,DPT 06/16/2019, 1:55 PM  [facility] Physical Therapy [address] [place], [place], [address] Phone: [phone]   Fax:  [phone]  Name: LADREA HOLLADAY MRN: [id number] Date of Birth: 1945-11-04

## 2019-06-16 NOTE — Patient Instructions (Signed)
Access Code: [email or web address]: [email or web address]: 04/19/2021Prepared by: Aaron Edelman NelsonExercises  Gastroc Stretch on Wall - 1 x daily - 3 x weekly - 3 reps - 1 sets - 30 hold  Soleus Stretch on Wall - 2 x daily - 6 x weekly - 3 sets - 30 hold  Prone Press Up on Elbows - 2 x daily - 6 x weekly - 10 reps - 2-3 sets - 5 hold  Hooklying Single Knee to Chest - 2 x daily - 6 x weekly - 3 reps - 1 sets - 30 hold  Supine Bridge - 2 x daily - 6 x weekly - 10 reps - 1-2 sets - 5 hold  Clamshell - 2 x daily - 6 x weekly - 10 reps - 2-3 sets  Standing Tandem Balance with Counter Support - 2 x daily - 6 x weekly - 3 reps - 1 sets - 30 hold  Walking with Head Rotation - 2 x daily - 6 x weekly - 3 reps  Walking with Head Nod - 2 x daily - 6 x weekly - 3 reps  Standing Hip Abduction with Resistance at Ankles and Counter Support - 2 x daily - 6 x weekly - 2-3 sets - 10 reps  Standing Hip Extension Kicks - 2 x daily - 6 x weekly - 2-3 sets - 10 reps  Mini Squat with Counter Support - 2 x daily - 6 x weekly - 10 reps - 1-2 sets  Side Stepping with Resistance at Thighs - 2 x daily - 6 x weekly - 10 reps - 2 sets

## 2019-06-18 ENCOUNTER — Encounter: Payer: PPO | Admitting: Physical Therapy

## 2019-06-24 NOTE — Addendum Note (Signed)
Addended by: Debbe Odea on: 06/24/2019 05:05 PM   Modules accepted: Orders

## 2019-08-14 DIAGNOSIS — F3341 Major depressive disorder, recurrent, in partial remission: Secondary | ICD-10-CM | POA: Diagnosis not present

## 2019-08-14 DIAGNOSIS — F3342 Major depressive disorder, recurrent, in full remission: Secondary | ICD-10-CM | POA: Diagnosis not present

## 2019-08-14 DIAGNOSIS — E782 Mixed hyperlipidemia: Secondary | ICD-10-CM | POA: Diagnosis not present

## 2019-08-14 DIAGNOSIS — M810 Age-related osteoporosis without current pathological fracture: Secondary | ICD-10-CM | POA: Diagnosis not present

## 2019-08-14 DIAGNOSIS — F341 Dysthymic disorder: Secondary | ICD-10-CM | POA: Diagnosis not present

## 2019-08-14 DIAGNOSIS — I10 Essential (primary) hypertension: Secondary | ICD-10-CM | POA: Diagnosis not present

## 2019-08-19 ENCOUNTER — Ambulatory Visit: Payer: Self-pay

## 2019-08-19 ENCOUNTER — Encounter: Payer: Self-pay | Admitting: Orthopaedic Surgery

## 2019-08-19 ENCOUNTER — Ambulatory Visit: Payer: PPO | Admitting: Orthopaedic Surgery

## 2019-08-19 VITALS — Ht 66.25 in | Wt 176.0 lb

## 2019-08-19 DIAGNOSIS — M79672 Pain in left foot: Secondary | ICD-10-CM

## 2019-08-19 DIAGNOSIS — M79671 Pain in right foot: Secondary | ICD-10-CM

## 2019-08-19 DIAGNOSIS — M170 Bilateral primary osteoarthritis of knee: Secondary | ICD-10-CM

## 2019-08-19 MED ORDER — LIDOCAINE HCL 1 % IJ SOLN
0.5000 mL | INTRAMUSCULAR | Status: AC | PRN
  Administered 2019-08-19: .5 mL

## 2019-08-19 MED ORDER — METHYLPREDNISOLONE ACETATE 40 MG/ML IJ SUSP
40.0000 mg | INTRAMUSCULAR | Status: AC | PRN
  Administered 2019-08-19: 40 mg via INTRA_ARTICULAR

## 2019-08-19 MED ORDER — BUPIVACAINE HCL 0.5 % IJ SOLN
3.0000 mL | INTRAMUSCULAR | Status: AC | PRN
Start: 2019-08-19 — End: 2019-08-19
  Administered 2019-08-19: 3 mL via INTRA_ARTICULAR

## 2019-08-19 MED ORDER — BUPIVACAINE HCL 0.25 % IJ SOLN
4.0000 mL | INTRAMUSCULAR | Status: AC | PRN
  Administered 2019-08-19: 4 mL via INTRA_ARTICULAR

## 2019-08-19 NOTE — Progress Notes (Signed)
Office Visit Note   Patient: Bonnie Juarez           Date of Birth: 1945/07/22           MRN: [id number] Visit Date: 08/19/2019              Requested by: Cari Caraway, [place],  [place] [address] PCP: Cari Caraway, MD   Assessment & Plan: Visit Diagnoses:  1. Bilateral foot pain   2. Bilateral primary osteoarthritis of knee     Plan: Patient can get some super feet inserts to help unload her midfoot.  We discussed gradual increase walking distance.  Bilateral knee injections were performed at her request.  She will follow-up if she has increased symptoms.  Follow-Up Instructions: No follow-ups on file.   Orders:  Orders Placed This Encounter  Procedures  . Large Joint Inj  . Large Joint Inj  . XR Foot Complete Left  . XR Foot Complete Right   No orders of the defined types were placed in this encounter.     Procedures: Large Joint Inj: R knee on 08/19/2019 4:48 PM Indications: pain and joint swelling Details: 22 G 1.5 in needle, anterolateral approach  Arthrogram: No  Medications: 40 mg methylPREDNISolone acetate 40 MG/ML; 0.5 mL lidocaine 1 %; 4 mL bupivacaine 0.25 % Outcome: tolerated well, no immediate complications Procedure, treatment alternatives, risks and benefits explained, specific risks discussed. Consent was given by the patient. Immediately prior to procedure a time out was called to verify the correct patient, procedure, equipment, support staff and site/side marked as required. Patient was prepped and draped in the usual sterile fashion.   Large Joint Inj: L knee on 08/19/2019 4:48 PM Indications: joint swelling and pain Details: 22 G 1.5 in needle, anterolateral approach  Arthrogram: No  Medications: 0.5 mL lidocaine 1 %; 3 mL bupivacaine 0.5 %; 40 mg methylPREDNISolone acetate 40 MG/ML Outcome: tolerated well, no immediate complications Procedure, treatment alternatives, risks and benefits explained, specific risks  discussed. Consent was given by the patient. Immediately prior to procedure a time out was called to verify the correct patient, procedure, equipment, support staff and site/side marked as required. Patient was prepped and draped in the usual sterile fashion.       Clinical Data: No additional findings.   Subjective: Chief Complaint  Patient presents with  . Left Knee - Pain  . Right Knee - Pain    HPI 74 year old female returns with bilateral knee pain.  Previous injections 05/27/2019 and she is requesting repeat injections today.  She had lumbar spine fusion done at L4-5 and after surgery was able to walk better gradually worked her way up to 2 miles a day and now is having increased pain in her feet some in the arch some over the heel mild over the plantar fascial origin some anteriorly over the ankle.  Past smoker when she was young she quit in the 39s.  ABI showed .99 on the right and 1.1 on the left from 2016.  She notes sometimes her feet or toes are purple.  She does not notice any significant swelling her feet or ankles.  Patient is going on vacation to [place] once able to walk.  Patient get special shoes at the shoe store on [address].  She uses them for walking.  She gradually ramped up her distance to 2 miles a day and now with increased foot pain she not been able to walk as much.  Patient is requesting x-rays of her feet today.   Review of Systems previous lumbar cervical fusion.  Bilateral knee osteoarthritis more significant on the right the left knee with mild varus on the right.  Otherwise all other systems noncontributory to HPI.   Objective: Vital Signs: Ht 5' 6.25" (1.683 m)   Wt 176 lb (79.8 kg)   BMI 28.19 kg/m   Physical Exam Constitutional:      Appearance: She is well-developed.  HENT:     Head: Normocephalic.     Right Ear: External ear normal.     Left Ear: External ear normal.  Eyes:     Pupils: Pupils are equal, round, and reactive to light.   Neck:     Thyroid: No thyromegaly.     Trachea: No tracheal deviation.  Cardiovascular:     Rate and Rhythm: Normal rate.  Pulmonary:     Effort: Pulmonary effort is normal.  Abdominal:     Palpations: Abdomen is soft.  Skin:    General: Skin is warm and dry.  Neurological:     Mental Status: She is alert and oriented to person, place, and time.  Psychiatric:        Behavior: Behavior normal.     Ortho Exam patient has some crepitus right and left knee range of motion.  Exam of her feet shows 1+ dorsalis pedis right and left 2+ posterior tib pulses.  Ankle subtalar motion is normal mild spurring dorsally right midfoot.  Negative popliteal compression test.  Some tenderness over the posterior tibial tendon right and left and also peroneals more on the right than left.  Subtalar motion is normal no plantar foot lesions.  Specialty Comments:  No specialty comments available.  Imaging: No results found.   PMFS History: Patient Active Problem List   Diagnosis Date Noted  . Plantar fasciitis of left foot 10/02/2018  . S/P lumbar fusion 02/06/2018  . Mixed dyslipidemia 11/13/2017  . Family history of heart disease 11/13/2017  . Statin intolerance 11/13/2017  . Bilateral primary osteoarthritis of knee 10/30/2017  . S/P cervical spinal fusion 12/20/2016  . Spondylosis without myelopathy or radiculopathy, lumbar region 10/05/2016  . Chronic bilateral low back pain without sciatica 10/05/2016  . Fibromyalgia 10/05/2016  . Closed fracture of right proximal humerus 01/08/2013    Class: Acute   Past Medical History:  Diagnosis Date  . Anxiety   . Arthritis   . Cancer ([place])    Hx: of squamouscell carcinoma on Left shin basal cell on face; melanoma on right lower leg  . Complication of anesthesia    12-11-03 deceased LOC and hypoxia overmedicating with Morphine and Phenergan; received Narcan X 2, overnight pulse ox  . Depression   . Fibromyalgia   . Frequent urination   . GERD  (gastroesophageal reflux disease)    Hx: of  . H/O hiatal hernia    PMH: of  . Headache(784.0)   . Hypertension   . IBS (irritable bowel syndrome)    Hx: of  . Osteopenia    Hx: of per pt, pt. adds osteoporosis- DOS  . Pneumonia    hx  . PONV (postoperative nausea and vomiting)   . Sleep apnea    Hx: of in the past-?mild apnea 10 yrs ago- followed by dr and none found    Family History  Problem Relation Age of Onset  . Heart disease Other   . Lung disease Other     Past Surgical History:  Procedure  Laterality Date  . APPENDECTOMY    . ARCUATE KERATECTOMY    . BREAST LUMPECTOMY Right 1991  . CERVICAL FUSION  1983  . FRACTURE SURGERY Right 2014   humerus  . HERNIA REPAIR     hiatal  . KNEE ARTHROSCOPY Right 2015  . [place] PLACATION  2010  . ORIF HUMERUS FRACTURE Right 01/08/2013   Procedure: OPEN REDUCTION INTERNAL FIXATION (ORIF) PROXIMAL HUMERUS FRACTURE;  Surgeon: Marybelle Killings, MD;  Location: [facility];  Service: Orthopedics;  Laterality: Right;  Open Reduction Internal Fixation Right Proximal Humerus Fracture  . POSTERIOR CERVICAL FUSION/FORAMINOTOMY N/A 12/20/2016   Procedure: Posterior Cervical Fusion with lateral mass fixation - Cervical Three-Thoracic Two;  Surgeon: Eustace Moore, MD;  Location: [facility];  Service: Neurosurgery;  Laterality: N/A;  . RADIAL HEAD REPLACEMENT  2005  . trigger thrumb release Left 2001  . tubular plasity  1970   Social History   Occupational History  . Not on file  Tobacco Use  . Smoking status: Former Smoker    Packs/day: 1.00    Years: 15.00    Pack years: 15.00    Types: Cigarettes    Quit date: 12/12/1980    Years since quitting: 38.7  . Smokeless tobacco: Never Used  . Tobacco comment: Quit 1982"  Vaping Use  . Vaping Use: Never used  Substance and Sexual Activity  . Alcohol use: Yes    Alcohol/week: 7.0 - 10.0 standard drinks    Types: 7 - 10 Glasses of wine per week  . Drug use: No  . Sexual activity: Not on file

## 2019-09-09 DIAGNOSIS — F411 Generalized anxiety disorder: Secondary | ICD-10-CM | POA: Diagnosis not present

## 2019-09-09 DIAGNOSIS — F4310 Post-traumatic stress disorder, unspecified: Secondary | ICD-10-CM | POA: Diagnosis not present

## 2019-10-18 DIAGNOSIS — M810 Age-related osteoporosis without current pathological fracture: Secondary | ICD-10-CM | POA: Diagnosis not present

## 2019-10-18 DIAGNOSIS — I10 Essential (primary) hypertension: Secondary | ICD-10-CM | POA: Diagnosis not present

## 2019-10-18 DIAGNOSIS — F3341 Major depressive disorder, recurrent, in partial remission: Secondary | ICD-10-CM | POA: Diagnosis not present

## 2019-10-18 DIAGNOSIS — E782 Mixed hyperlipidemia: Secondary | ICD-10-CM | POA: Diagnosis not present

## 2019-10-18 DIAGNOSIS — F3342 Major depressive disorder, recurrent, in full remission: Secondary | ICD-10-CM | POA: Diagnosis not present

## 2019-10-18 DIAGNOSIS — F341 Dysthymic disorder: Secondary | ICD-10-CM | POA: Diagnosis not present

## 2019-10-27 ENCOUNTER — Other Ambulatory Visit (HOSPITAL_COMMUNITY)
Admission: RE | Admit: 2019-10-27 | Discharge: 2019-10-27 | Disposition: A | Discharge status: Home / Self Care | Payer: PPO | Source: Ambulatory Visit | Attending: Family Medicine | Admitting: Family Medicine

## 2019-10-27 ENCOUNTER — Other Ambulatory Visit: Payer: Self-pay | Admitting: Family Medicine

## 2019-10-27 DIAGNOSIS — F3341 Major depressive disorder, recurrent, in partial remission: Secondary | ICD-10-CM | POA: Diagnosis not present

## 2019-10-27 DIAGNOSIS — K219 Gastro-esophageal reflux disease without esophagitis: Secondary | ICD-10-CM | POA: Diagnosis not present

## 2019-10-27 DIAGNOSIS — Z0000 Encounter for general adult medical examination without abnormal findings: Secondary | ICD-10-CM | POA: Diagnosis not present

## 2019-10-27 DIAGNOSIS — Z1151 Encounter for screening for human papillomavirus (HPV): Secondary | ICD-10-CM | POA: Insufficient documentation

## 2019-10-27 DIAGNOSIS — E782 Mixed hyperlipidemia: Secondary | ICD-10-CM | POA: Diagnosis not present

## 2019-10-27 DIAGNOSIS — E559 Vitamin D deficiency, unspecified: Secondary | ICD-10-CM | POA: Diagnosis not present

## 2019-10-27 DIAGNOSIS — Z23 Encounter for immunization: Secondary | ICD-10-CM | POA: Diagnosis not present

## 2019-10-27 DIAGNOSIS — Z8742 Personal history of other diseases of the female genital tract: Secondary | ICD-10-CM | POA: Insufficient documentation

## 2019-10-27 DIAGNOSIS — R7309 Other abnormal glucose: Secondary | ICD-10-CM | POA: Diagnosis not present

## 2019-10-27 DIAGNOSIS — M810 Age-related osteoporosis without current pathological fracture: Secondary | ICD-10-CM | POA: Diagnosis not present

## 2019-10-27 DIAGNOSIS — I10 Essential (primary) hypertension: Secondary | ICD-10-CM | POA: Diagnosis not present

## 2019-10-27 DIAGNOSIS — Z79899 Other long term (current) drug therapy: Secondary | ICD-10-CM | POA: Diagnosis not present

## 2019-10-27 DIAGNOSIS — Z01419 Encounter for gynecological examination (general) (routine) without abnormal findings: Secondary | ICD-10-CM | POA: Diagnosis not present

## 2019-10-27 DIAGNOSIS — N952 Postmenopausal atrophic vaginitis: Secondary | ICD-10-CM | POA: Diagnosis not present

## 2019-10-28 ENCOUNTER — Other Ambulatory Visit: Payer: Self-pay | Admitting: Family Medicine

## 2019-10-28 DIAGNOSIS — Z1231 Encounter for screening mammogram for malignant neoplasm of breast: Secondary | ICD-10-CM

## 2019-10-28 DIAGNOSIS — M810 Age-related osteoporosis without current pathological fracture: Secondary | ICD-10-CM

## 2019-10-29 LAB — CYTOLOGY - PAP
Comment: NEGATIVE
Diagnosis: NEGATIVE
High risk HPV: NEGATIVE

## 2019-11-10 DIAGNOSIS — L570 Actinic keratosis: Secondary | ICD-10-CM | POA: Diagnosis not present

## 2019-11-10 DIAGNOSIS — L719 Rosacea, unspecified: Secondary | ICD-10-CM | POA: Diagnosis not present

## 2019-11-10 DIAGNOSIS — Z85828 Personal history of other malignant neoplasm of skin: Secondary | ICD-10-CM | POA: Diagnosis not present

## 2019-11-10 DIAGNOSIS — Z86018 Personal history of other benign neoplasm: Secondary | ICD-10-CM | POA: Diagnosis not present

## 2019-11-10 DIAGNOSIS — L821 Other seborrheic keratosis: Secondary | ICD-10-CM | POA: Diagnosis not present

## 2019-11-10 DIAGNOSIS — Z85820 Personal history of malignant melanoma of skin: Secondary | ICD-10-CM | POA: Diagnosis not present

## 2019-11-10 DIAGNOSIS — D225 Melanocytic nevi of trunk: Secondary | ICD-10-CM | POA: Diagnosis not present

## 2019-11-10 DIAGNOSIS — W57XXXA Bitten or stung by nonvenomous insect and other nonvenomous arthropods, initial encounter: Secondary | ICD-10-CM | POA: Diagnosis not present

## 2019-11-10 DIAGNOSIS — L578 Other skin changes due to chronic exposure to nonionizing radiation: Secondary | ICD-10-CM | POA: Diagnosis not present

## 2019-11-10 DIAGNOSIS — S80862A Insect bite (nonvenomous), left lower leg, initial encounter: Secondary | ICD-10-CM | POA: Diagnosis not present

## 2019-11-10 DIAGNOSIS — L814 Other melanin hyperpigmentation: Secondary | ICD-10-CM | POA: Diagnosis not present

## 2019-11-10 DIAGNOSIS — L820 Inflamed seborrheic keratosis: Secondary | ICD-10-CM | POA: Diagnosis not present

## 2019-11-18 ENCOUNTER — Other Ambulatory Visit: Payer: Self-pay

## 2019-11-18 ENCOUNTER — Ambulatory Visit
Admission: RE | Admit: 2019-11-18 | Discharge: 2019-11-18 | Disposition: A | Discharge status: Home / Self Care | Payer: PPO | Source: Ambulatory Visit | Attending: Family Medicine | Admitting: Family Medicine

## 2019-11-18 DIAGNOSIS — Z1231 Encounter for screening mammogram for malignant neoplasm of breast: Secondary | ICD-10-CM

## 2019-12-09 DIAGNOSIS — E782 Mixed hyperlipidemia: Secondary | ICD-10-CM | POA: Diagnosis not present

## 2019-12-09 DIAGNOSIS — I10 Essential (primary) hypertension: Secondary | ICD-10-CM | POA: Diagnosis not present

## 2019-12-09 DIAGNOSIS — F3341 Major depressive disorder, recurrent, in partial remission: Secondary | ICD-10-CM | POA: Diagnosis not present

## 2019-12-09 DIAGNOSIS — M810 Age-related osteoporosis without current pathological fracture: Secondary | ICD-10-CM | POA: Diagnosis not present

## 2019-12-09 DIAGNOSIS — F3342 Major depressive disorder, recurrent, in full remission: Secondary | ICD-10-CM | POA: Diagnosis not present

## 2019-12-09 DIAGNOSIS — F341 Dysthymic disorder: Secondary | ICD-10-CM | POA: Diagnosis not present

## 2019-12-29 DIAGNOSIS — E041 Nontoxic single thyroid nodule: Secondary | ICD-10-CM | POA: Diagnosis not present

## 2019-12-29 DIAGNOSIS — M791 Myalgia, unspecified site: Secondary | ICD-10-CM | POA: Diagnosis not present

## 2019-12-30 ENCOUNTER — Other Ambulatory Visit: Payer: Self-pay | Admitting: Family Medicine

## 2019-12-30 DIAGNOSIS — E041 Nontoxic single thyroid nodule: Secondary | ICD-10-CM

## 2020-01-07 ENCOUNTER — Ambulatory Visit
Admission: RE | Admit: 2020-01-07 | Discharge: 2020-01-07 | Disposition: A | Discharge status: Home / Self Care | Payer: PPO | Source: Ambulatory Visit | Attending: Family Medicine | Admitting: Family Medicine

## 2020-01-07 DIAGNOSIS — E049 Nontoxic goiter, unspecified: Secondary | ICD-10-CM | POA: Diagnosis not present

## 2020-01-07 DIAGNOSIS — E041 Nontoxic single thyroid nodule: Secondary | ICD-10-CM

## 2020-01-13 ENCOUNTER — Telehealth: Payer: Self-pay | Admitting: Pulmonary Disease

## 2020-01-13 NOTE — Telephone Encounter (Signed)
Called and spoke with pt and she stated that she reviewed BI's information and thought that maybe he could help her with her fibromyalgia.  I explained to her that BI only see's pulmonary issues as do all of the other providers in this office.  She is aware and will call [facility]

## 2020-01-14 DIAGNOSIS — F411 Generalized anxiety disorder: Secondary | ICD-10-CM | POA: Diagnosis not present

## 2020-01-14 DIAGNOSIS — F4310 Post-traumatic stress disorder, unspecified: Secondary | ICD-10-CM | POA: Diagnosis not present

## 2020-01-19 ENCOUNTER — Other Ambulatory Visit: Payer: Self-pay | Admitting: Gastroenterology

## 2020-01-19 DIAGNOSIS — K219 Gastro-esophageal reflux disease without esophagitis: Secondary | ICD-10-CM

## 2020-01-19 DIAGNOSIS — R110 Nausea: Secondary | ICD-10-CM

## 2020-01-27 ENCOUNTER — Ambulatory Visit
Admission: RE | Admit: 2020-01-27 | Discharge: 2020-01-27 | Disposition: A | Discharge status: Home / Self Care | Payer: PPO | Source: Ambulatory Visit | Attending: Gastroenterology | Admitting: Gastroenterology

## 2020-01-27 DIAGNOSIS — R110 Nausea: Secondary | ICD-10-CM

## 2020-01-27 DIAGNOSIS — K219 Gastro-esophageal reflux disease without esophagitis: Secondary | ICD-10-CM

## 2020-01-29 DIAGNOSIS — F3342 Major depressive disorder, recurrent, in full remission: Secondary | ICD-10-CM | POA: Diagnosis not present

## 2020-01-29 DIAGNOSIS — F341 Dysthymic disorder: Secondary | ICD-10-CM | POA: Diagnosis not present

## 2020-01-29 DIAGNOSIS — K219 Gastro-esophageal reflux disease without esophagitis: Secondary | ICD-10-CM | POA: Diagnosis not present

## 2020-01-29 DIAGNOSIS — E782 Mixed hyperlipidemia: Secondary | ICD-10-CM | POA: Diagnosis not present

## 2020-01-29 DIAGNOSIS — F3341 Major depressive disorder, recurrent, in partial remission: Secondary | ICD-10-CM | POA: Diagnosis not present

## 2020-01-29 DIAGNOSIS — I10 Essential (primary) hypertension: Secondary | ICD-10-CM | POA: Diagnosis not present

## 2020-01-29 DIAGNOSIS — M810 Age-related osteoporosis without current pathological fracture: Secondary | ICD-10-CM | POA: Diagnosis not present

## 2020-02-11 ENCOUNTER — Other Ambulatory Visit: Payer: Self-pay | Admitting: Internal Medicine

## 2020-02-11 NOTE — Telephone Encounter (Signed)
Rx has been sent to the pharmacy electronically. ° °

## 2020-02-12 ENCOUNTER — Other Ambulatory Visit: Payer: Self-pay

## 2020-02-12 ENCOUNTER — Ambulatory Visit
Admission: RE | Admit: 2020-02-12 | Discharge: 2020-02-12 | Disposition: A | Discharge status: Home / Self Care | Payer: PPO | Source: Ambulatory Visit | Attending: Family Medicine | Admitting: Family Medicine

## 2020-02-12 DIAGNOSIS — M85851 Other specified disorders of bone density and structure, right thigh: Secondary | ICD-10-CM | POA: Diagnosis not present

## 2020-02-12 DIAGNOSIS — M810 Age-related osteoporosis without current pathological fracture: Secondary | ICD-10-CM

## 2020-02-12 DIAGNOSIS — Z780 Asymptomatic menopausal state: Secondary | ICD-10-CM | POA: Diagnosis not present

## 2020-03-02 DIAGNOSIS — E782 Mixed hyperlipidemia: Secondary | ICD-10-CM | POA: Diagnosis not present

## 2020-03-02 LAB — LIPID PANEL
Chol/HDL Ratio: 2.3 ratio (ref 0.0–4.4)
Cholesterol, Total: 190 mg/dL (ref 100–199)
HDL: 81 mg/dL (ref >39)
LDL Chol Calc (NIH): 88 mg/dL (ref 0–99)
Triglycerides: 121 mg/dL (ref 0–149)
VLDL Cholesterol Cal: 21 mg/dL (ref 5–40)

## 2020-03-03 DIAGNOSIS — K9189 Other postprocedural complications and disorders of digestive system: Secondary | ICD-10-CM | POA: Diagnosis not present

## 2020-03-03 DIAGNOSIS — K449 Diaphragmatic hernia without obstruction or gangrene: Secondary | ICD-10-CM | POA: Diagnosis not present

## 2020-03-03 DIAGNOSIS — K219 Gastro-esophageal reflux disease without esophagitis: Secondary | ICD-10-CM | POA: Diagnosis not present

## 2020-03-05 DIAGNOSIS — H524 Presbyopia: Secondary | ICD-10-CM | POA: Diagnosis not present

## 2020-03-05 DIAGNOSIS — H5213 Myopia, bilateral: Secondary | ICD-10-CM | POA: Diagnosis not present

## 2020-03-05 DIAGNOSIS — Z961 Presence of intraocular lens: Secondary | ICD-10-CM | POA: Diagnosis not present

## 2020-03-08 ENCOUNTER — Encounter: Payer: Self-pay | Admitting: Internal Medicine

## 2020-03-08 ENCOUNTER — Telehealth (INDEPENDENT_AMBULATORY_CARE_PROVIDER_SITE_OTHER): Payer: PPO | Admitting: Internal Medicine

## 2020-03-08 VITALS — Ht 66.0 in | Wt 190.0 lb

## 2020-03-08 DIAGNOSIS — T466X5A Adverse effect of antihyperlipidemic and antiarteriosclerotic drugs, initial encounter: Secondary | ICD-10-CM | POA: Diagnosis not present

## 2020-03-08 DIAGNOSIS — Z789 Other specified health status: Secondary | ICD-10-CM | POA: Diagnosis not present

## 2020-03-08 DIAGNOSIS — R931 Abnormal findings on diagnostic imaging of heart and coronary circulation: Secondary | ICD-10-CM

## 2020-03-08 DIAGNOSIS — Z8249 Family history of ischemic heart disease and other diseases of the circulatory system: Secondary | ICD-10-CM

## 2020-03-08 DIAGNOSIS — M791 Myalgia, unspecified site: Secondary | ICD-10-CM

## 2020-03-08 DIAGNOSIS — E782 Mixed hyperlipidemia: Secondary | ICD-10-CM

## 2020-03-08 NOTE — Progress Notes (Signed)
Virtual Visit via Telephone Note   This visit type was conducted due to national recommendations for restrictions regarding the COVID-19 Pandemic (e.g. social distancing) in an effort to limit this patient's exposure and mitigate transmission in our community.  Due to her co-morbid illnesses, this patient is at least at moderate risk for complications without adequate follow up.  This format is felt to be most appropriate for this patient at this time.  The patient did not have access to video technology/had technical difficulties with video requiring transitioning to audio format only (telephone).  All issues noted in this document were discussed and addressed.  No physical exam could be performed with this format.  Please refer to the patient's chart for her  consent to telehealth for [facility].   Date:  03/08/2020   ID:  Bonnie Juarez, DOB 1945-08-21, MRN [id number] The patient was identified using 2 identifiers.  Evaluation Performed:  Follow-Up Visit  Patient Location:  [address] [place] [address]  Provider location:   [address], [place], [place] [address]  PCP:  Cari Caraway, MD  Cardiologist:  Pixie Casino, MD Electrophysiologist:  None   Chief Complaint:  Manage dyslipidemia  History of Present Illness:    Bonnie Juarez is a 75 y.o. female who presents via audio/video conferencing for a telehealth visit today.  This is a pleasant 75 year old female patient is kindly referred to me for evaluation of dyslipidemia.  She has a strong family history of heart disease mostly in her mother who had 2 stents at age 28 and a stroke in her 8s.  There is also history of heart failure diabetes and possible heart attack in her siblings.  Personal medical problems include fibromyalgia and problems with her cervical spine which is been operated on, dyslipidemia, hypertension and history of melanoma.  She currently takes rosuvastatin 10 mg once weekly which is  the most medicine she can tolerate.  In the past she is taken atorvastatin which caused significant myalgias as well as pravastatin which she tolerated however did not have significant improvement in her dyslipidemia.  She says this significantly affects her fibromyalgia.  Based on these numbers are most recent lipid profile in August showed total cholesterol 250, triglycerides 103, HDL 76 and LDL 154.  Although she has no known coronary disease, given her age, hypertension and family history of coronary disease, her goal LDL is likely less than 100.  At this point she is unlikely to reach that with additional therapies other than likely a PCSK9 inhibitor.  She reports being asymptomatic as far as no chest pain or worsening shortness of breath.  03/18/2018  Bonnie Juarez is seen today in follow-up.  She is doing very well.  She had spinal surgery in December.  She was having symptoms of sciatica in both legs which is improved significantly.  She is exercising more regularly and has less generalized pain.  She is tolerating the Repatha without any the side effects that she had previously on statins.  Fortunately she has had marked reduction in her lipid profile.  Total cholesterol 4 days ago was 153, triglycerides 101, HDL 73 and LDL of 60.  This is a marked improvement in her lipid profile will certainly reduce her risk.  She did have a coronary calcium score in September which showed low calcium score of 80, however there was scattered coronary calcifications and moderate diffuse calcifications of the aortic root and descending aorta.  03/05/2019  Bonnie Juarez is seen today in annual follow-up.  Overall she reports doing really well.  Unfortunately she was not able to get her Repatha reauthorized.  Is not clear why this was denied and recently she tried to pick up more medications but was told that she needed new reauthorization.  Previously however it was reported that she did not need prior authorization.  We  will look into this further.  She is also concerned about cost of medications and I advised her to consider the [facility].  We will provide her with an additional sample of Repatha today as she took her last dose 2 weeks ago and has no further doses.  Anticipate will take at least a couple weeks to do the paperwork.  Her most recent lipid profile was in October 2020 which showed total cholesterol 181, HDL 86, LDL 76 and triglycerides 107.  03/08/2020  Bonnie Juarez returns today for follow-up.  She continues to do well.  She remains on Repatha which was recently represcribed.  She has had a slight increase in her lipids with total cholesterol now 190, triglycerides 121, HDL 81 and LDL of 88.  It was noted that she had a calcium score of 80 which put her at an intermediate risk.  She remains asymptomatic, denied chest pain or worsening shortness of breath.  She reports some worsening of the diet and less physical activity which might explain for the difference in her numbers.  The patient does not have symptoms concerning for COVID-19 infection (fever, chills, cough, or new SHORTNESS OF BREATH).    Prior CV studies:   The following studies were reviewed today:  Chart reviewed, lab work  PMHx:  Past Medical History:  Diagnosis Date  . Anxiety   . Arthritis   . Cancer ([facility])    Hx: of squamouscell carcinoma on Left shin basal cell on face; melanoma on right lower leg  . Complication of anesthesia    Dec 06, 2003 deceased LOC and hypoxia overmedicating with Morphine and Phenergan; received Narcan X 2, overnight pulse ox  . Depression   . Fibromyalgia   . Frequent urination   . GERD (gastroesophageal reflux disease)    Hx: of  . H/O hiatal hernia    PMH: of  . Headache(784.0)   . Hypertension   . IBS (irritable bowel syndrome)    Hx: of  . Osteopenia    Hx: of per pt, pt. adds osteoporosis- DOS  . Pneumonia    hx  . PONV (postoperative nausea and vomiting)   . Sleep apnea    Hx:  of in the past-?mild apnea 10 yrs ago- followed by dr and none found    Past Surgical History:  Procedure Laterality Date  . APPENDECTOMY    . ARCUATE KERATECTOMY    . BREAST LUMPECTOMY Right 1991  . CERVICAL FUSION  1983  . FRACTURE SURGERY Right 2014   humerus  . HERNIA REPAIR     hiatal  . KNEE ARTHROSCOPY Right 2015  . [place] PLACATION  2010  . ORIF HUMERUS FRACTURE Right 01/08/2013   Procedure: OPEN REDUCTION INTERNAL FIXATION (ORIF) PROXIMAL HUMERUS FRACTURE;  Surgeon: Marybelle Killings, MD;  Location: [facility];  Service: Orthopedics;  Laterality: Right;  Open Reduction Internal Fixation Right Proximal Humerus Fracture  . POSTERIOR CERVICAL FUSION/FORAMINOTOMY N/A 12/20/2016   Procedure: Posterior Cervical Fusion with lateral mass fixation - Cervical Three-Thoracic Two;  Surgeon: Eustace Moore, MD;  Location: [facility];  Service: Neurosurgery;  Laterality: N/A;  . RADIAL HEAD REPLACEMENT  2005  . trigger thrumb release Left 2001  . tubular plasity  1970    FAMHx:  Family History  Problem Relation Age of Onset  . Heart disease Other   . Lung disease Other   . Breast cancer Neg Hx     SOCHx:   reports that she quit smoking about 39 years ago. Her smoking use included cigarettes. She has a 15.00 pack-year smoking history. She has never used smokeless tobacco. She reports current alcohol use of about 7.0 - 10.0 standard drinks of alcohol per week. She reports that she does not use drugs.  ALLERGIES:  Allergies  Allergen Reactions  . Boniva [Ibandronic Acid] Other (See Comments)    Bone Pain  . Dilaudid [Hydromorphone Hcl] Other (See Comments)    Hallucinations  . Risedronate Sodium Other (See Comments)    Bone pain    MEDS:  Current Meds  Medication Sig  . Ascorbic Acid (VITAMIN C) 1000 MG tablet Take 1,000 mg by mouth daily.  . Cholecalciferol (VITAMIN D) 50 MCG (2000 UT) tablet Take 2,000 Units by mouth every morning.   . Coenzyme Q10 (COQ10) 100 MG CAPS Take 100 mg  by mouth 2 (two) times a week.   . conjugated estrogens (PREMARIN) vaginal cream Place 1 g vaginally once a week.  . escitalopram (LEXAPRO) 20 MG tablet Take 20 mg by mouth daily.   . fluticasone (FLONASE) 50 MCG/ACT nasal spray Place 2 sprays into the nose daily as needed for allergies or rhinitis.   Marland Kitchen losartan (COZAAR) 50 MG tablet Take 50 mg by mouth daily.  Marland Kitchen olmesartan (BENICAR) 20 MG tablet Take 20 mg by mouth daily.  Marland Kitchen OVER THE COUNTER MEDICATION Take 1 capsule by mouth daily as needed (pain). CBD OIL Caps 25 mg per capsule  . pantoprazole (PROTONIX) 40 MG tablet Take 40 mg by mouth daily.  Marland Kitchen REPATHA 140 MG/ML SOSY INJECT 140 MG INTO THE SKIN EVERY 14 (FOURTEEN) DAYS.  Marland Kitchen traZODone (DESYREL) 100 MG tablet Take 100 mg by mouth at bedtime.   . vitamin B-12 (CYANOCOBALAMIN) 1000 MCG tablet Take 2,000 mcg by mouth every morning.     ROS: Pertinent items noted in HPI and remainder of comprehensive ROS otherwise negative.  Labs/Other Tests and Data Reviewed:    Recent Labs: No results found for requested labs within last 8760 hours.   Recent Lipid Panel Lab Results  Component Value Date/Time   CHOL 190 03/02/2020 11:30 AM   TRIG 121 03/02/2020 11:30 AM   HDL 81 03/02/2020 11:30 AM   CHOLHDL 2.3 03/02/2020 11:30 AM   LDLCALC 88 03/02/2020 11:30 AM    Wt Readings from Last 3 Encounters:  03/08/20 190 lb (86.2 kg)  08/19/19 176 lb (79.8 kg)  05/27/19 176 lb (79.8 kg)     Exam:    Vital Signs:  Ht 5\' 6"  (1.676 m)   Wt 190 lb (86.2 kg)   BMI 30.67 kg/m    Exam not performed due to telephone visit  ASSESSMENT & PLAN:    1. Mixed dyslipidemia  2. Mild coronary artery calcification-CAC 80 (10/2017) 3. Strong family history of coronary disease 4. Hypertension 5. Statin intolerance-on Repatha  Bonnie Juarez is doing well although her LDL cholesterol has risen somewhat.  I suspect this is primarily dietary in addition to decreased physical activity.  She will continue to work on  that.  We will plan to continue her Repatha.  She  remains asymptomatic.  No other medication changes today.  Follow-up with me annually or sooner as necessary.  COVID-19 Education: The signs and symptoms of COVID-19 were discussed with the patient and how to seek care for testing (follow up with PCP or arrange E-visit).  The importance of social distancing was discussed today.  Patient Risk:   After full review of this patients clinical status, I feel that they are at least moderate risk at this time.  Time:   Today, I have spent 25 minutes with the patient with telehealth technology discussing dyslipidemia, coronary calcification, family history of heart disease.     Medication Adjustments/Labs and Tests Ordered: Current medicines are reviewed at length with the patient today.  Concerns regarding medicines are outlined above.   Tests Ordered: No orders of the defined types were placed in this encounter.   Medication Changes: No orders of the defined types were placed in this encounter.   Disposition:  in 1 year(s)  Pixie Casino, MD, [facility], [place] Director of the Advanced Lipid Disorders &  Cardiovascular Risk Reduction Clinic Diplomate of the American Board of Clinical Lipidology Attending Cardiologist  Direct Dial: [phone]  Fax: [phone]  Website:  [email or web address]  Pixie Casino, MD  03/08/2020 1:15 PM

## 2020-03-08 NOTE — Patient Instructions (Signed)
Medication Instructions:  No Changes In Medications at this time.  *If you need a refill on your cardiac medications before your next appointment, please call your pharmacy*  Follow-Up: At [facility], you and your health needs are our priority.  As part of our continuing mission to provide you with exceptional heart care, we have created designated Provider Care Teams.  These Care Teams include your primary Cardiologist (physician) and Advanced Practice Providers (APPs -  Physician Assistants and Nurse Practitioners) who all work together to provide you with the care you need, when you need it.    Your next appointment:   1 year   The format for your next appointment:   In Person  Provider:   K. Chad Hilty, MD 

## 2020-03-09 ENCOUNTER — Telehealth: Payer: Self-pay | Admitting: Internal Medicine

## 2020-03-09 NOTE — Telephone Encounter (Signed)
Attempted prior authorization for Repatha via request generated from [facility] -- Available without authorization

## 2020-04-01 DIAGNOSIS — M810 Age-related osteoporosis without current pathological fracture: Secondary | ICD-10-CM | POA: Diagnosis not present

## 2020-04-01 DIAGNOSIS — E782 Mixed hyperlipidemia: Secondary | ICD-10-CM | POA: Diagnosis not present

## 2020-04-01 DIAGNOSIS — F3341 Major depressive disorder, recurrent, in partial remission: Secondary | ICD-10-CM | POA: Diagnosis not present

## 2020-04-01 DIAGNOSIS — K219 Gastro-esophageal reflux disease without esophagitis: Secondary | ICD-10-CM | POA: Diagnosis not present

## 2020-04-01 DIAGNOSIS — I10 Essential (primary) hypertension: Secondary | ICD-10-CM | POA: Diagnosis not present

## 2020-04-05 NOTE — Patient Instructions (Addendum)
DUE TO COVID-19 ONLY ONE VISITOR IS ALLOWED TO COME WITH YOU AND STAY IN THE WAITING ROOM ONLY DURING PRE OP AND PROCEDURE.   IF YOU WILL BE ADMITTED INTO THE HOSPITAL YOU ARE ALLOWED ONE SUPPORT PERSON DURING VISITATION HOURS ONLY (10AM -8PM)   . The support person may change daily. . The support person must pass our screening, gel in and out, and wear a mask at all times, including in the patient's room. . Patients must also wear a mask when staff or their support person are in the room.   COVID SWAB TESTING MUST BE COMPLETED ON: Saturday, 04-10-20 @ 12:10 PM   [address] [place], [place] [address]  (Must self quarantine after testing. Follow instructions on handout.)        Your procedure is scheduled on:  Tuesday, 04-13-20   Report to [facility] Main  Entrance    Report to admitting at 10:30 AM   Call this number if you have problems the morning of surgery [phone]   Do not eat food :After Midnight.   May have liquids until 9:30 AM  day of surgery  CLEAR LIQUID DIET  Foods Allowed                                                                     Foods Excluded  Water, Black Coffee and tea, regular and decaf               liquids that you cannot  Plain Jell-O in any flavor  (No red)                                      see through such as: Fruit ices (not with fruit pulp)                                      milk, soups, orange juice              Iced Popsicles (No red)                                      All solid food                                   Apple juices Sports drinks like Gatorade (No red) Lightly seasoned clear broth or consume(fat free) Sugar, honey syrup      Oral Hygiene is also important to reduce your risk of infection.                                    Remember - BRUSH YOUR TEETH THE MORNING OF SURGERY WITH YOUR REGULAR TOOTHPASTE   Do NOT smoke after Midnight   Take these medicines the morning of surgery with A SIP OF WATER:    Loratadine, Pantoprazole  You may not have any metal on your body including hair pins, jewelry, and body piercings             Do not wear make-up, lotions, powders, perfumes/cologne, or deodorant             Do not wear nail polish.  Do not shave  48 hours prior to surgery.     Do not bring valuables to the hospital. [place].   Contacts, dentures or bridgework may not be worn into surgery.   Bring small overnight bag day of surgery.    Special Instructions: Bring a copy of your healthcare power of attorney and living will documents         the day of surgery if you haven't  scanned them in before.              Please read over the following fact sheets you were given: IF YOU HAVE QUESTIONS ABOUT YOUR PRE OP INSTRUCTIONS PLEASE CALL  [place] - Preparing for Surgery Before surgery, you can play an important role.  Because skin is not sterile, your skin needs to be as free of germs as possible.  You can reduce the number of germs on your skin by washing with CHG (chlorahexidine gluconate) soap before surgery.  CHG is an antiseptic cleaner which kills germs and bonds with the skin to continue killing germs even after washing. Please DO NOT use if you have an allergy to CHG or antibacterial soaps.  If your skin becomes reddened/irritated stop using the CHG and inform your nurse when you arrive at Short Stay. Do not shave (including legs and underarms) for at least 48 hours prior to the first CHG shower.  You may shave your face/neck.  Please follow these instructions carefully:  1.  Shower with CHG Soap the night before surgery and the  morning of surgery.  2.  If you choose to wash your hair, wash your hair first as usual with your normal  shampoo.  3.  After you shampoo, rinse your hair and body thoroughly to remove the shampoo.                             4.  Use CHG as you would any other liquid  soap.  You can apply chg directly to the skin and wash.  Gently with a scrungie or clean washcloth.  5.  Apply the CHG Soap to your body ONLY FROM THE NECK DOWN.   Do   not use on face/ open                           Wound or open sores. Avoid contact with eyes, ears mouth and   genitals (private parts).                       Wash face,  Genitals (private parts) with your normal soap.             6.  Wash thoroughly, paying special attention to the area where your    surgery  will be performed.  7.  Thoroughly rinse your body with warm water from the neck down.  8.  DO NOT shower/wash with your normal soap after using and rinsing off the CHG Soap.  9.  Pat yourself dry with a clean towel.            10.  Wear clean pajamas.            11.  Place clean sheets on your bed the night of your first shower and do not  sleep with pets. Day of Surgery : Do not apply any lotions/deodorants the morning of surgery.  Please wear clean clothes to the hospital/surgery center.  FAILURE TO FOLLOW THESE INSTRUCTIONS MAY RESULT IN THE CANCELLATION OF YOUR SURGERY  PATIENT SIGNATURE_________________________________  NURSE SIGNATURE__________________________________  ________________________________________________________________________

## 2020-04-05 NOTE — Progress Notes (Addendum)
COVID Vaccine Completed: x3 Date COVID Vaccine completed: 04-20-19, 05-14-19 Booster 11/21 COVID vaccine manufacturer: Pfizer      Date of COVID positive in last 90 days: N/A  PCP - Cari Caraway, MD Cardiologist - Lyman Bishop, MD, evaluated for dyslipidemia and family history of CAD.  Chest x-ray - N/A EKG - 04-06-20 in Epic Stress Test - 2014 in Epic ECHO - 2014 in Epic Cardiac Cath -  Pacemaker/ICD device last checked:  Sleep Study - 2+ years ago, mild sleep apnea CPAP - N/A   Fasting Blood Sugar - N/A Checks Blood Sugar _____ times a day  Blood Thinner Instructions:N/A Aspirin Instructions: Last Dose:  Activity level:   Unable to go up a flight of stairs without symptoms of joint pain.  Has hx of knee pain and fibromyalgia      Anesthesia review: N/A   Patient denies shortness of breath, fever, cough and chest pain at PAT appointment   Patient verbalized understanding of instructions that were given to them at the PAT appointment. Patient was also instructed that they will need to review over the PAT instructions again at home before surgery.

## 2020-04-06 ENCOUNTER — Encounter (HOSPITAL_COMMUNITY)
Admission: RE | Admit: 2020-04-06 | Discharge: 2020-04-06 | Disposition: A | Discharge status: Home / Self Care | Payer: PPO | Source: Ambulatory Visit | Attending: Surgery | Admitting: Surgery

## 2020-04-06 ENCOUNTER — Other Ambulatory Visit: Payer: Self-pay

## 2020-04-06 ENCOUNTER — Encounter (HOSPITAL_COMMUNITY): Payer: Self-pay

## 2020-04-06 DIAGNOSIS — I10 Essential (primary) hypertension: Secondary | ICD-10-CM | POA: Insufficient documentation

## 2020-04-06 DIAGNOSIS — Z01818 Encounter for other preprocedural examination: Secondary | ICD-10-CM | POA: Diagnosis not present

## 2020-04-06 HISTORY — DX: Malignant melanoma of skin, unspecified: C43.9

## 2020-04-06 LAB — CBC
HCT: 43.7 % (ref 36.0–46.0)
Hemoglobin: 14 g/dL (ref 12.0–15.0)
MCH: 28.7 pg (ref 26.0–34.0)
MCHC: 32 g/dL (ref 30.0–36.0)
MCV: 89.7 fL (ref 80.0–100.0)
Platelets: 241 10*3/uL (ref 150–400)
RBC: 4.87 MIL/uL (ref 3.87–5.11)
RDW: 13.2 % (ref 11.5–15.5)
WBC: 7.9 10*3/uL (ref 4.0–10.5)
nRBC: 0 % (ref 0.0–0.2)

## 2020-04-06 LAB — BASIC METABOLIC PANEL
Anion gap: 7 (ref 5–15)
BUN: 15 mg/dL (ref 8–23)
CO2: 28 mmol/L (ref 22–32)
Calcium: 9.4 mg/dL (ref 8.9–10.3)
Chloride: 106 mmol/L (ref 98–111)
Creatinine, Ser: 1.09 mg/dL — ABNORMAL HIGH (ref 0.44–1.00)
GFR, Estimated: 53 mL/min — ABNORMAL LOW (ref >60)
Glucose, Bld: 93 mg/dL (ref 70–99)
Potassium: 4.9 mmol/L (ref 3.5–5.1)
Sodium: 141 mmol/L (ref 135–145)

## 2020-04-10 ENCOUNTER — Other Ambulatory Visit (HOSPITAL_COMMUNITY)
Admission: RE | Admit: 2020-04-10 | Discharge: 2020-04-10 | Disposition: A | Discharge status: Home / Self Care | Payer: PPO | Source: Ambulatory Visit | Attending: Surgery | Admitting: Surgery

## 2020-04-10 DIAGNOSIS — Z20822 Contact with and (suspected) exposure to covid-19: Secondary | ICD-10-CM | POA: Diagnosis present

## 2020-04-10 DIAGNOSIS — Z818 Family history of other mental and behavioral disorders: Secondary | ICD-10-CM | POA: Diagnosis not present

## 2020-04-10 DIAGNOSIS — K9189 Other postprocedural complications and disorders of digestive system: Secondary | ICD-10-CM | POA: Diagnosis present

## 2020-04-10 DIAGNOSIS — Z87891 Personal history of nicotine dependence: Secondary | ICD-10-CM | POA: Diagnosis not present

## 2020-04-10 DIAGNOSIS — E782 Mixed hyperlipidemia: Secondary | ICD-10-CM | POA: Diagnosis not present

## 2020-04-10 DIAGNOSIS — Z79899 Other long term (current) drug therapy: Secondary | ICD-10-CM | POA: Diagnosis not present

## 2020-04-10 DIAGNOSIS — K5900 Constipation, unspecified: Secondary | ICD-10-CM | POA: Diagnosis present

## 2020-04-10 DIAGNOSIS — Z885 Allergy status to narcotic agent status: Secondary | ICD-10-CM | POA: Diagnosis not present

## 2020-04-10 DIAGNOSIS — Z01812 Encounter for preprocedural laboratory examination: Secondary | ICD-10-CM | POA: Insufficient documentation

## 2020-04-10 DIAGNOSIS — K219 Gastro-esophageal reflux disease without esophagitis: Secondary | ICD-10-CM | POA: Diagnosis present

## 2020-04-10 DIAGNOSIS — K449 Diaphragmatic hernia without obstruction or gangrene: Secondary | ICD-10-CM | POA: Diagnosis present

## 2020-04-10 DIAGNOSIS — Z85820 Personal history of malignant melanoma of skin: Secondary | ICD-10-CM | POA: Diagnosis not present

## 2020-04-10 DIAGNOSIS — F418 Other specified anxiety disorders: Secondary | ICD-10-CM | POA: Diagnosis not present

## 2020-04-10 DIAGNOSIS — R1310 Dysphagia, unspecified: Secondary | ICD-10-CM | POA: Diagnosis present

## 2020-04-10 DIAGNOSIS — F32A Depression, unspecified: Secondary | ICD-10-CM | POA: Diagnosis present

## 2020-04-10 DIAGNOSIS — Z8049 Family history of malignant neoplasm of other genital organs: Secondary | ICD-10-CM | POA: Diagnosis not present

## 2020-04-10 DIAGNOSIS — Z79890 Hormone replacement therapy (postmenopausal): Secondary | ICD-10-CM | POA: Diagnosis not present

## 2020-04-10 DIAGNOSIS — Z8261 Family history of arthritis: Secondary | ICD-10-CM | POA: Diagnosis not present

## 2020-04-10 DIAGNOSIS — Z888 Allergy status to other drugs, medicaments and biological substances status: Secondary | ICD-10-CM | POA: Diagnosis not present

## 2020-04-10 DIAGNOSIS — Y838 Other surgical procedures as the cause of abnormal reaction of the patient, or of later complication, without mention of misadventure at the time of the procedure: Secondary | ICD-10-CM | POA: Diagnosis present

## 2020-04-10 LAB — SARS CORONAVIRUS 2 (TAT 6-24 HRS): SARS Coronavirus 2: NEGATIVE

## 2020-04-12 MED ORDER — BUPIVACAINE LIPOSOME 1.3 % IJ SUSP
20.0000 mL | INTRAMUSCULAR | Status: DC
  Filled 2020-04-12: qty 20

## 2020-04-12 NOTE — H&P (Signed)
Bonnie Juarez Location: [place] Office Patient #: [id number] DOB: [birth year] Married / Language: English / Race: White Female   History of Present Illness  The patient is a 75 year old female who presents with a hiatal hernia. Recurrent symptoms. Referred by Will Outlaw. PROCEDURES: Laparoscopic repair of hiatal hernia (three suture pledgeted closure posteriorly) and laparoscopic Nissen fundoplication (performed over #56 lighted bougie with three suture wrap secured with tie knots) SURGEON: [place]. Daphine Deutscher, MD ASSISTANT: Bonnie Juarez, M.D. May 2012  She had a period of retching after spinal surgery and after that began having symptoms of reflux again. Her recent upper GI shows that her wrap looks to be somewhat intact and isn't vomiting that she has herniated stomach above the wrap into the chest. I placed pledgets were closed her posteriorly so that may be problematic. She would like to go ahead with revision and I told her that this would not be as good as original surgery but we would do the best for her we could. She does have nocturnal reflux wakes up coughing and choking so she is significantly symptomatic. She has underlying fibromyalgia. She has had significant osteoporosis and has had both cervical and lumbar fusions. We'll schedule her for laparoscopic revision of her hiatal hernia. Will block off 4 hours.   Past Surgical History Bonnie Juarez, CMA; 03/03/2020 2:07 PM) Appendectomy  Breast Mass; Local Excision  Right. Cataract Surgery  Bilateral. Nissen Fundoplication  Oral Surgery  Shoulder Surgery  Right. Spinal Surgery - Lower Back  Spinal Surgery - Neck   Diagnostic Studies History Bonnie Juarez, CMA; 03/03/2020 2:07 PM) Colonoscopy  1-5 years ago Mammogram  within last year Pap Smear  1-5 years ago  Allergies Bonnie Juarez, CMA; 03/03/2020 2:05 PM) Bonnie Juarez *ENDOCRINE AND METABOLIC AGENTS - MISC.*  Dilaudid  *ANALGESICS - OPIOID*  Risedronate Sodium *ENDOCRINE AND METABOLIC AGENTS - MISC.*   Medication History Bonnie Juarez, CMA; 03/03/2020 2:07 PM) Escitalopram Oxalate (20MG  Tablet, Oral) Active. Fluticasone Propionate (50MCG/ACT Suspension, Nasal) Active. Losartan Potassium (50MG  Tablet, Oral) Active. Olmesartan Medoxomil (20MG  Tablet, Oral) Active. Pantoprazole Sodium (40MG  Tablet DR, Oral) Active. Repatha (140MG /ML Soln Pref Syr, Subcutaneous) Active. traZODone HCl (100MG  Tablet, Oral) Active. Premarin (0.625MG /GM Cream, Vaginal) Active. Medications Reconciled  Social History Bonnie Juarez, CMA; 03/03/2020 2:07 PM) Alcohol use  Moderate alcohol use. Caffeine use  Carbonated beverages, Coffee, Tea. Illicit drug use  Remotely quit drug use. Tobacco use  Former smoker.  Family History Bonnie Juarez, CMA; 03/03/2020 2:07 PM) Alcohol Abuse  Father. Arthritis  Mother, Sister. Cerebrovascular Accident  Mother. Cervical Cancer  Sister. Depression  Brother, Daughter, Mother, Sister. Diabetes Mellitus  Sister. Heart Disease  Brother, Sister. Heart disease in female family member before age 62  Hypertension  Brother, Sister. Respiratory Condition  Brother, Father. Thyroid problems  Mother.  Pregnancy / Birth History Bonnie Juarez, CMA; 03/03/2020 2:07 PM) Age at menarche  12 years. Age of menopause  <43 Gravida  1 Maternal age  61-20 Para  1  Other Problems Bonnie Juarez, CMA; 03/03/2020 2:07 PM) Arthritis  Back Pain  Bladder Problems  Depression  Gastroesophageal Reflux Disease  General anesthesia - complications  High blood pressure  Hypercholesterolemia  Lump In Breast  Melanoma  Other disease, cancer, significant illness     Review of Systems (Bonnie Juarez CMA; 03/03/2020 2:07 PM) General Present- Fatigue, Night Sweats and Weight Gain. Not Present- Appetite Loss, Chills, Fever and Weight  Loss. Skin Present- Dryness. Not Present- Change in Wart/Mole, Hives, Jaundice,  New Lesions, Non-Healing Wounds, Rash and Ulcer. HEENT Present- Hoarseness, Seasonal Allergies and Wears glasses/contact lenses. Not Present- Earache, Hearing Loss, Nose Bleed, Oral Ulcers, Ringing in the Ears, Sinus Pain, Sore Throat, Visual Disturbances and Yellow Eyes. Respiratory Present- Chronic Cough and Snoring. Not Present- Bloody sputum, Difficulty Breathing and Wheezing. Breast Not Present- Breast Mass, Breast Pain, Nipple Discharge and Skin Changes. Gastrointestinal Present- Bloating, Change in Bowel Habits, Constipation, Difficulty Swallowing, Indigestion and Nausea. Not Present- Abdominal Pain, Bloody Stool, Chronic diarrhea, Excessive gas, Gets full quickly at meals, Hemorrhoids, Rectal Pain and Vomiting. Female Genitourinary Present- Frequency, Nocturia and Urgency. Not Present- Painful Urination and Pelvic Pain. Musculoskeletal Present- Back Pain, Joint Pain, Joint Stiffness, Muscle Pain and Muscle Weakness. Not Present- Swelling of Extremities. Neurological Present- Decreased Memory, Headaches, Trouble walking and Weakness. Not Present- Fainting, Numbness, Seizures, Tingling and Tremor. Psychiatric Present- Anxiety, Change in Sleep Pattern and Depression. Not Present- Bipolar, Fearful and Frequent crying. Endocrine Present- Cold Intolerance, Hair Changes and Hot flashes. Not Present- Excessive Hunger, Heat Intolerance and New Diabetes. Hematology Present- Easy Bruising. Not Present- Blood Thinners, Excessive bleeding, Gland problems, HIV and Persistent Infections.  Vitals (Bonnie Juarez CMA; 03/03/2020 2:04 PM) 03/03/2020 2:03 PM Weight: 190.6 lb Height: 66.25in Body Surface Area: 1.97 m Body Mass Index: 30.53 kg/m  Pulse: 74 (Regular)  BP: 120/80(Sitting, Left Arm, Standard)   Physical Exam (Mykelle Cockerell B. Daphine Deutscher MD; 03/03/2020 2:46 PM) General Note: pleasant white female no acute  distress who is not overweight but about a BMI of around 29.  HEENT exam is unremarkable Neck is affected by her fusion Chest is clear to auscultation Heart sinus rhythm without murmurs or gallops Abdomen we checked the area around her umbilicus but I don't feel a hernia. she has had recent constipation and I would advise her to take some MiraLAX in addition to Dulcolax suppositories. Extremity exam she has some knee issues may need a knee replacement at some point.   Assessment & Plan  SLIPPED NISSEN FUNDOPLICATION (K91.89) Story: Miralax powder for constipation-mix and drink as directed Impression: durable hiatal hernia repair for about 7 years ultimately having recurrent for recurrent reflux after vomiting and retching in the immediate postop period after neck surgery. We'll schedule for laparoscopic revision of her hiatal hernia repair.  Matt B. Daphine Deutscher, MD, FACS

## 2020-04-13 ENCOUNTER — Encounter (HOSPITAL_COMMUNITY): Payer: Self-pay | Admitting: Surgery

## 2020-04-13 ENCOUNTER — Other Ambulatory Visit: Payer: Self-pay

## 2020-04-13 ENCOUNTER — Inpatient Hospital Stay (HOSPITAL_COMMUNITY): Payer: PPO | Admitting: Certified Registered Nurse Anesthetist

## 2020-04-13 ENCOUNTER — Encounter (HOSPITAL_COMMUNITY)
Admission: RE | Disposition: A | Discharge status: Home / Self Care | Payer: Self-pay | Source: Home / Self Care | Attending: Surgery

## 2020-04-13 ENCOUNTER — Inpatient Hospital Stay (HOSPITAL_COMMUNITY)
Admission: RE | Admit: 2020-04-13 | Discharge: 2020-04-14 | DRG: 327 | Disposition: A | Discharge status: Home / Self Care | Payer: PPO | Attending: Surgery | Admitting: Surgery

## 2020-04-13 DIAGNOSIS — Z818 Family history of other mental and behavioral disorders: Secondary | ICD-10-CM | POA: Diagnosis not present

## 2020-04-13 DIAGNOSIS — Y838 Other surgical procedures as the cause of abnormal reaction of the patient, or of later complication, without mention of misadventure at the time of the procedure: Secondary | ICD-10-CM | POA: Diagnosis present

## 2020-04-13 DIAGNOSIS — Z87891 Personal history of nicotine dependence: Secondary | ICD-10-CM

## 2020-04-13 DIAGNOSIS — K9189 Other postprocedural complications and disorders of digestive system: Secondary | ICD-10-CM | POA: Diagnosis present

## 2020-04-13 DIAGNOSIS — Z20822 Contact with and (suspected) exposure to covid-19: Secondary | ICD-10-CM | POA: Diagnosis present

## 2020-04-13 DIAGNOSIS — Z9889 Other specified postprocedural states: Secondary | ICD-10-CM

## 2020-04-13 DIAGNOSIS — R1310 Dysphagia, unspecified: Secondary | ICD-10-CM | POA: Diagnosis present

## 2020-04-13 DIAGNOSIS — Z85820 Personal history of malignant melanoma of skin: Secondary | ICD-10-CM

## 2020-04-13 DIAGNOSIS — Z888 Allergy status to other drugs, medicaments and biological substances status: Secondary | ICD-10-CM

## 2020-04-13 DIAGNOSIS — K5900 Constipation, unspecified: Secondary | ICD-10-CM | POA: Diagnosis present

## 2020-04-13 DIAGNOSIS — Z8049 Family history of malignant neoplasm of other genital organs: Secondary | ICD-10-CM

## 2020-04-13 DIAGNOSIS — K219 Gastro-esophageal reflux disease without esophagitis: Secondary | ICD-10-CM | POA: Diagnosis present

## 2020-04-13 DIAGNOSIS — F32A Depression, unspecified: Secondary | ICD-10-CM | POA: Diagnosis present

## 2020-04-13 DIAGNOSIS — Z8261 Family history of arthritis: Secondary | ICD-10-CM

## 2020-04-13 DIAGNOSIS — Z79890 Hormone replacement therapy (postmenopausal): Secondary | ICD-10-CM

## 2020-04-13 DIAGNOSIS — Z79899 Other long term (current) drug therapy: Secondary | ICD-10-CM

## 2020-04-13 DIAGNOSIS — Z885 Allergy status to narcotic agent status: Secondary | ICD-10-CM | POA: Diagnosis not present

## 2020-04-13 DIAGNOSIS — Z8719 Personal history of other diseases of the digestive system: Secondary | ICD-10-CM

## 2020-04-13 DIAGNOSIS — K449 Diaphragmatic hernia without obstruction or gangrene: Principal | ICD-10-CM | POA: Diagnosis present

## 2020-04-13 HISTORY — PX: NISSEN FUNDOPLICATION: SHX2091

## 2020-04-13 LAB — CBC
HCT: 41.9 % (ref 36.0–46.0)
Hemoglobin: 13.6 g/dL (ref 12.0–15.0)
MCH: 28.9 pg (ref 26.0–34.0)
MCHC: 32.5 g/dL (ref 30.0–36.0)
MCV: 89.1 fL (ref 80.0–100.0)
Platelets: 206 10*3/uL (ref 150–400)
RBC: 4.7 MIL/uL (ref 3.87–5.11)
RDW: 12.7 % (ref 11.5–15.5)
WBC: 11.2 10*3/uL — ABNORMAL HIGH (ref 4.0–10.5)
nRBC: 0 % (ref 0.0–0.2)

## 2020-04-13 LAB — CREATININE, SERUM
Creatinine, Ser: 0.94 mg/dL (ref 0.44–1.00)
GFR, Estimated: >60 mL/min (ref >60)

## 2020-04-13 SURGERY — FUNDOPLICATION, NISSEN
Anesthesia: General | Site: Abdomen

## 2020-04-13 MED ORDER — SUGAMMADEX SODIUM 200 MG/2ML IV SOLN
INTRAVENOUS | Status: DC | PRN
  Administered 2020-04-13: 200 mg via INTRAVENOUS

## 2020-04-13 MED ORDER — DEXAMETHASONE SODIUM PHOSPHATE 4 MG/ML IJ SOLN
INTRAMUSCULAR | Status: DC | PRN
  Administered 2020-04-13: 10 mg via INTRAVENOUS

## 2020-04-13 MED ORDER — CHLORHEXIDINE GLUCONATE CLOTH 2 % EX PADS: 6.0000 | MEDICATED_PAD | Freq: Once | CUTANEOUS | Status: DC

## 2020-04-13 MED ORDER — ACETAMINOPHEN 500 MG PO TABS
1000.0000 mg | ORAL_TABLET | ORAL | Status: AC
  Administered 2020-04-13: 1000 mg via ORAL
  Filled 2020-04-13: qty 2

## 2020-04-13 MED ORDER — FENTANYL CITRATE (PF) 250 MCG/5ML IJ SOLN
INTRAMUSCULAR | Status: DC | PRN
  Administered 2020-04-13 (×3): 50 ug via INTRAVENOUS
  Administered 2020-04-13: 100 ug via INTRAVENOUS

## 2020-04-13 MED ORDER — METOPROLOL TARTRATE 5 MG/5ML IV SOLN
5.0000 mg | Freq: Four times a day (QID) | INTRAVENOUS | Status: DC | PRN
Start: 2020-04-13 — End: 2020-04-14

## 2020-04-13 MED ORDER — SUCCINYLCHOLINE CHLORIDE 200 MG/10ML IV SOSY
PREFILLED_SYRINGE | INTRAVENOUS | Status: AC
  Filled 2020-04-13: qty 10

## 2020-04-13 MED ORDER — LACTATED RINGERS IV SOLN: INTRAVENOUS | Status: DC

## 2020-04-13 MED ORDER — ACETAMINOPHEN 325 MG PO TABS: 325.0000 mg | ORAL_TABLET | Freq: Once | ORAL | Status: DC | PRN

## 2020-04-13 MED ORDER — PANTOPRAZOLE SODIUM 40 MG PO TBEC
40.0000 mg | DELAYED_RELEASE_TABLET | Freq: Every day | ORAL | Status: DC
  Administered 2020-04-14: 40 mg via ORAL
  Filled 2020-04-13: qty 1

## 2020-04-13 MED ORDER — SUCCINYLCHOLINE CHLORIDE 200 MG/10ML IV SOSY
PREFILLED_SYRINGE | INTRAVENOUS | Status: DC | PRN
  Administered 2020-04-13: 120 mg via INTRAVENOUS

## 2020-04-13 MED ORDER — ESCITALOPRAM OXALATE 20 MG PO TABS
40.0000 mg | ORAL_TABLET | Freq: Every day | ORAL | Status: DC
  Administered 2020-04-13: 40 mg via ORAL
  Filled 2020-04-13: qty 2

## 2020-04-13 MED ORDER — BUPROPION HCL ER (SR) 150 MG PO TB12
150.0000 mg | ORAL_TABLET | Freq: Every day | ORAL | Status: DC
  Administered 2020-04-14: 150 mg via ORAL
  Filled 2020-04-13: qty 1

## 2020-04-13 MED ORDER — ROCURONIUM BROMIDE 10 MG/ML (PF) SYRINGE
PREFILLED_SYRINGE | INTRAVENOUS | Status: AC
  Filled 2020-04-13: qty 10

## 2020-04-13 MED ORDER — HYDROCODONE-ACETAMINOPHEN 5-325 MG PO TABS
1.0000 | ORAL_TABLET | ORAL | Status: DC | PRN
  Administered 2020-04-13 – 2020-04-14 (×3): 1 via ORAL
  Filled 2020-04-13 (×3): qty 1

## 2020-04-13 MED ORDER — ONDANSETRON 4 MG PO TBDP: 4.0000 mg | ORAL_TABLET | Freq: Four times a day (QID) | ORAL | Status: DC | PRN

## 2020-04-13 MED ORDER — KCL IN DEXTROSE-NACL 20-5-0.45 MEQ/L-%-% IV SOLN
INTRAVENOUS | Status: DC
  Filled 2020-04-13 (×2): qty 1000

## 2020-04-13 MED ORDER — ONDANSETRON HCL 4 MG/2ML IJ SOLN
INTRAMUSCULAR | Status: DC | PRN
  Administered 2020-04-13: 4 mg via INTRAVENOUS

## 2020-04-13 MED ORDER — BUPIVACAINE LIPOSOME 1.3 % IJ SUSP
INTRAMUSCULAR | Status: DC | PRN
  Administered 2020-04-13: 20 mL

## 2020-04-13 MED ORDER — ACETAMINOPHEN 10 MG/ML IV SOLN: 1000.0000 mg | Freq: Once | INTRAVENOUS | Status: DC | PRN

## 2020-04-13 MED ORDER — ORAL CARE MOUTH RINSE: 15.0000 mL | Freq: Once | OROMUCOSAL | Status: AC

## 2020-04-13 MED ORDER — FENTANYL CITRATE (PF) 100 MCG/2ML IJ SOLN
INTRAMUSCULAR | Status: AC
  Filled 2020-04-13: qty 2

## 2020-04-13 MED ORDER — ONDANSETRON HCL 4 MG/2ML IJ SOLN: 4.0000 mg | Freq: Four times a day (QID) | INTRAMUSCULAR | Status: DC | PRN

## 2020-04-13 MED ORDER — CHLORHEXIDINE GLUCONATE 0.12 % MT SOLN
15.0000 mL | Freq: Once | OROMUCOSAL | Status: AC
  Administered 2020-04-13: 15 mL via OROMUCOSAL

## 2020-04-13 MED ORDER — PROPOFOL 10 MG/ML IV BOLUS
INTRAVENOUS | Status: DC | PRN
  Administered 2020-04-13: 120 mg via INTRAVENOUS

## 2020-04-13 MED ORDER — SODIUM CHLORIDE 0.9 % IR SOLN
Status: DC | PRN
  Administered 2020-04-13: 1000 mL

## 2020-04-13 MED ORDER — SODIUM CHLORIDE (PF) 0.9 % IJ SOLN
INTRAMUSCULAR | Status: AC
  Filled 2020-04-13: qty 10

## 2020-04-13 MED ORDER — FENTANYL CITRATE (PF) 100 MCG/2ML IJ SOLN
INTRAMUSCULAR | Status: AC
  Administered 2020-04-13: 25 ug via INTRAVENOUS
  Filled 2020-04-13: qty 2

## 2020-04-13 MED ORDER — SODIUM CHLORIDE (PF) 0.9 % IJ SOLN
INTRAMUSCULAR | Status: DC | PRN
Start: 2020-04-13 — End: 2020-04-13
  Administered 2020-04-13: 10 mL

## 2020-04-13 MED ORDER — MEPERIDINE HCL 50 MG/ML IJ SOLN: 6.2500 mg | INTRAMUSCULAR | Status: DC | PRN

## 2020-04-13 MED ORDER — SODIUM CHLORIDE 0.9 % IV SOLN
2.0000 g | INTRAVENOUS | Status: AC
  Administered 2020-04-13: 2 g via INTRAVENOUS
  Filled 2020-04-13: qty 2

## 2020-04-13 MED ORDER — PROPOFOL 10 MG/ML IV BOLUS
INTRAVENOUS | Status: AC
  Filled 2020-04-13: qty 20

## 2020-04-13 MED ORDER — AMISULPRIDE (ANTIEMETIC) 5 MG/2ML IV SOLN
INTRAVENOUS | Status: AC
  Filled 2020-04-13: qty 2

## 2020-04-13 MED ORDER — CEFAZOLIN SODIUM-DEXTROSE 2-4 GM/100ML-% IV SOLN
2.0000 g | Freq: Three times a day (TID) | INTRAVENOUS | Status: AC
  Administered 2020-04-13: 2 g via INTRAVENOUS
  Filled 2020-04-13: qty 100

## 2020-04-13 MED ORDER — HEPARIN SODIUM (PORCINE) 5000 UNIT/ML IJ SOLN
5000.0000 [IU] | Freq: Three times a day (TID) | INTRAMUSCULAR | Status: DC
  Administered 2020-04-13 – 2020-04-14 (×3): 5000 [IU] via SUBCUTANEOUS
  Filled 2020-04-13 (×3): qty 1

## 2020-04-13 MED ORDER — SODIUM CHLORIDE 0.9 % IR SOLN: Status: DC | PRN

## 2020-04-13 MED ORDER — LIDOCAINE 2% (20 MG/ML) 5 ML SYRINGE
INTRAMUSCULAR | Status: DC | PRN
  Administered 2020-04-13: 40 mg via INTRAVENOUS

## 2020-04-13 MED ORDER — FENTANYL CITRATE (PF) 100 MCG/2ML IJ SOLN: 25.0000 ug | INTRAMUSCULAR | Status: DC | PRN

## 2020-04-13 MED ORDER — ROCURONIUM BROMIDE 10 MG/ML (PF) SYRINGE
PREFILLED_SYRINGE | INTRAVENOUS | Status: DC | PRN
  Administered 2020-04-13: 30 mg via INTRAVENOUS
  Administered 2020-04-13: 60 mg via INTRAVENOUS

## 2020-04-13 MED ORDER — ACETAMINOPHEN 160 MG/5ML PO SOLN: 325.0000 mg | Freq: Once | ORAL | Status: DC | PRN

## 2020-04-13 MED ORDER — AMISULPRIDE (ANTIEMETIC) 5 MG/2ML IV SOLN
10.0000 mg | Freq: Once | INTRAVENOUS | Status: AC | PRN
  Administered 2020-04-13: 10 mg via INTRAVENOUS

## 2020-04-13 MED ORDER — MORPHINE SULFATE (PF) 2 MG/ML IV SOLN
1.0000 mg | INTRAVENOUS | Status: DC | PRN
Start: 2020-04-13 — End: 2020-04-14

## 2020-04-13 MED ORDER — HEPARIN SODIUM (PORCINE) 5000 UNIT/ML IJ SOLN
5000.0000 [IU] | Freq: Once | INTRAMUSCULAR | Status: AC
  Administered 2020-04-13: 5000 [IU] via SUBCUTANEOUS
  Filled 2020-04-13: qty 1

## 2020-04-13 MED ORDER — SCOPOLAMINE 1 MG/3DAYS TD PT72
1.0000 | MEDICATED_PATCH | TRANSDERMAL | Status: DC
  Administered 2020-04-13: 1.5 mg via TRANSDERMAL
  Filled 2020-04-13: qty 1

## 2020-04-13 SURGICAL SUPPLY — 43 items
ADH SKN CLS APL DERMABOND .7 (GAUZE/BANDAGES/DRESSINGS) ×1
APPLIER CLIP 5 13 M/L LIGAMAX5 (MISCELLANEOUS)
APPLIER CLIP ROT 10 11.4 M/L (STAPLE)
APR CLP MED LRG 11.4X10 (STAPLE)
APR CLP MED LRG 5 ANG JAW (MISCELLANEOUS)
CLIP APPLIE 5 13 M/L LIGAMAX5 (MISCELLANEOUS) IMPLANT
CLIP APPLIE ROT 10 11.4 M/L (STAPLE) IMPLANT
COVER SURGICAL LIGHT HANDLE (MISCELLANEOUS) ×2 IMPLANT
COVER WAND RF STERILE (DRAPES) IMPLANT
DECANTER SPIKE VIAL GLASS SM (MISCELLANEOUS) ×1 IMPLANT
DERMABOND ADVANCED (GAUZE/BANDAGES/DRESSINGS) ×1
DERMABOND ADVANCED .7 DNX12 (GAUZE/BANDAGES/DRESSINGS) IMPLANT
DEVICE SUT QUICK LOAD TK 5 (STAPLE) ×2 IMPLANT
DEVICE SUTURE ENDOST 10MM (ENDOMECHANICALS) ×1 IMPLANT
DEVICE TROCAR PUNCTURE CLOSURE (ENDOMECHANICALS) IMPLANT
DISSECTOR BLUNT TIP ENDO 5MM (MISCELLANEOUS) IMPLANT
DRAIN PENROSE 0.25X18 (DRAIN) ×1 IMPLANT
DRAPE LAPAROSCOPIC ABDOMINAL (DRAPES) ×2 IMPLANT
ELECT REM PT RETURN 15FT ADLT (MISCELLANEOUS) ×2 IMPLANT
GLOVE BIOGEL M 8.0 STRL (GLOVE) ×2 IMPLANT
GLOVE SURG ENC MOIS LTX SZ8 (GLOVE) ×2 IMPLANT
GOWN STRL REUS W/TWL XL LVL3 (GOWN DISPOSABLE) ×8 IMPLANT
IRRIG SUCT STRYKERFLOW 2 WTIP (MISCELLANEOUS) ×2
IRRIGATION SUCT STRKRFLW 2 WTP (MISCELLANEOUS) ×1 IMPLANT
KIT BASIN OR (CUSTOM PROCEDURE TRAY) ×2 IMPLANT
KIT TURNOVER KIT A (KITS) ×2 IMPLANT
PENCIL SMOKE EVACUATOR (MISCELLANEOUS) IMPLANT
RELOAD ENDO STITCH (ENDOMECHANICALS) ×4 IMPLANT
RELOAD SUT TRIPLE-STITCH 2-0 (ENDOMECHANICALS) IMPLANT
SCISSORS LAP 5X45 EPIX DISP (ENDOMECHANICALS) ×2 IMPLANT
SHEARS HARMONIC ACE PLUS 45CM (MISCELLANEOUS) ×2 IMPLANT
SLEEVE XCEL OPT CAN 5 100 (ENDOMECHANICALS) ×3 IMPLANT
SUT VIC AB 4-0 SH 18 (SUTURE) ×2 IMPLANT
SUT VICRYL 0 TIES 12 18 (SUTURE) IMPLANT
TIP INNERVISION DETACH 40FR (MISCELLANEOUS) IMPLANT
TIP INNERVISION DETACH 50FR (MISCELLANEOUS) IMPLANT
TIP INNERVISION DETACH 56FR (MISCELLANEOUS) IMPLANT
TIPS INNERVISION DETACH 40FR (MISCELLANEOUS)
TOWEL OR 17X26 10 PK STRL BLUE (TOWEL DISPOSABLE) ×2 IMPLANT
TRAY FOLEY MTR SLVR 16FR STAT (SET/KITS/TRAYS/PACK) IMPLANT
TRAY LAPAROSCOPIC (CUSTOM PROCEDURE TRAY) ×2 IMPLANT
TROCAR XCEL 12X100 BLDLESS (ENDOMECHANICALS) IMPLANT
TROCAR XCEL NON-BLD 11X100MML (ENDOMECHANICALS) IMPLANT

## 2020-04-13 NOTE — Op Note (Signed)
Bonnie Juarez  30-Mar-1945   04/13/2020    PCP:  Cari Caraway, MD   Surgeon: Kaylyn Lim, MD, FACS  Asst:  Louanna Raw, MD  Anes:  general  Preop Dx: [name] fundoplication  Postop Dx: same  Procedure: Laparoscopic dissection of foregut with delineation of anatomy, takedown of Nissen fundoplication to a 449 degree wrap, closure of the hiatus with 1 suture posteriorly and one suture anteriorly, endoscopy to verify anatomy. Location Surgery: [place] long #1 Complications: None noted  EBL:   Minimal cc  Drains: None  Description of Procedure:  The patient was taken to OR 1.  After anesthesia was administered and the patient was prepped  with technique care and a timeout was performed.  Access to the abdomen was achieved to the left upper quadrant with a 5 mm Optiview.  Standard trocar placement including [id number] to the right of the midline and the rest were fives.  After trocar placement a tap block was done on both sides with 20 cc of Exparel.  Liver retractor was placed and there were adhesions from the previous Nissen titanium tie knots to the liver which were taken down and some of these were pretty filmy adhesions that were taken down with harmonic.  The right and left crura were identified and the.  The when she was vomiting she had probably an anterior slip of a portion of her stomach above the wrap.  In taking down the wrap were able to reduce that portion of the stomach with a was rise Otting above the wrap and all of this was appeared to be below the diaphragm.  I passed the endoscope with some difficulty because of her previous neck surgery but was able to get down and examine her EG junction and found the esophagus to believe to be in the abdomen.  With the Nissen taken down anteriorly there was no impediment to drainage into the stomach.  Pledgets were fused to the wrap stomach and I felt that the hazard of trying to take these apart would be to create gastrotomies  that would preclude partial plication possibly.  When we had completed the operation as described felt like everything looked good and there was no evidence of any perforations and the esophagus was in the abdomen.  There was an anterior suture placed but weakest there is still room well around the esophagus without making that too tight.  I felt it was hazardous with her previous neck surgery and with her esophagus to try to pass the bougie.  Following deflation of the abdomen the skin was closed with 4-0 Monocryl and Dermabond.  The patient tolerated the procedure well and was taken to the PACU in stable condition.     Matt B. Hassell Done, [place], [facility], [place]

## 2020-04-13 NOTE — Transfer of Care (Signed)
Immediate Anesthesia Transfer of Care Note  Patient: Bonnie Juarez  Procedure(s) Performed: EXPLORATION OF FORGUT WITH ENDOSCOPY AND HITAL HERNIA CLOSURE, UPPER ENDOSCOPY (N/A Abdomen)  Patient Location: PACU  Anesthesia Type:General  Level of Consciousness: awake, alert  and oriented  Airway & Oxygen Therapy: Patient Spontanous Breathing and Patient connected to face mask oxygen  Post-op Assessment: Report given to RN and Post -op Vital signs reviewed and stable  Post vital signs: Reviewed and stable  Last Vitals:  Vitals Value Taken Time  BP 130/83 04/13/20 1205  Temp    Pulse 67 04/13/20 1207  Resp 19 04/13/20 1209  SpO2 97 % 04/13/20 1207  Vitals shown include unvalidated device data.  Last Pain:  Vitals:   04/13/20 0820  TempSrc:   PainSc: 0-No pain         Complications: No complications documented.

## 2020-04-13 NOTE — Anesthesia Procedure Notes (Signed)
Procedure Name: Intubation Date/Time: 04/13/2020 10:02 AM Performed by: [place] Rithy Mandley C, CRNA Pre-anesthesia Checklist: Patient identified, Emergency Drugs available, Suction available and Patient being monitored Patient Re-evaluated:Patient Re-evaluated prior to induction Oxygen Delivery Method: Circle system utilized Preoxygenation: Pre-oxygenation with 100% oxygen Induction Type: IV induction and Rapid sequence Ventilation: Mask ventilation without difficulty Laryngoscope Size: Glidescope, Mac and 3 Grade View: Grade I Tube type: Oral Tube size: 7.0 mm Number of attempts: 1 Airway Equipment and Method: Oral airway,  Video-laryngoscopy and Rigid stylet Placement Confirmation: ETT inserted through vocal cords under direct vision,  positive ETCO2 and breath sounds checked- equal and bilateral Secured at: 22 cm Tube secured with: Tape Dental Injury: Teeth and Oropharynx as per pre-operative assessment  Comments: Elective glidescope due to cervical disc surgery

## 2020-04-13 NOTE — Anesthesia Preprocedure Evaluation (Addendum)
Anesthesia Evaluation  Patient identified by MRN, date of birth, ID band Patient awake    Reviewed: Allergy & Precautions, NPO status , Patient's Chart, lab work & pertinent test results  History of Anesthesia Complications (+) PONV and history of anesthetic complications  Airway Mallampati: II  TM Distance: <3 FB Neck ROM: Limited    Dental  (+) Teeth Intact, Caps, Dental Advisory Given   Pulmonary sleep apnea , former smoker,    breath sounds clear to auscultation       Cardiovascular hypertension, Pt. on medications  Rhythm:Regular Rate:Normal     Neuro/Psych  Headaches, PSYCHIATRIC DISORDERS Anxiety Depression  Neuromuscular disease    GI/Hepatic Neg liver ROS, hiatal hernia, GERD  Medicated,  Endo/Other  negative endocrine ROS  Renal/GU negative Renal ROS     Musculoskeletal  (+) Arthritis , Fibromyalgia -  Abdominal Normal abdominal exam  (+)   Peds  Hematology negative hematology ROS (+)   Anesthesia Other Findings   Reproductive/Obstetrics                            Anesthesia Physical Anesthesia Plan  ASA: II  Anesthesia Plan: General   Post-op Pain Management:    Induction: Intravenous and Rapid sequence  PONV Risk Score and Plan: 4 or greater and Ondansetron, Dexamethasone, Amisulpride and Treatment may vary due to age or medical condition  Airway Management Planned: Oral ETT and Video Laryngoscope Planned  Additional Equipment: None  Intra-op Plan:   Post-operative Plan: Extubation in OR  Informed Consent: I have reviewed the patients History and Physical, chart, labs and discussed the procedure including the risks, benefits and alternatives for the proposed anesthesia with the patient or authorized representative who has indicated his/her understanding and acceptance.     Dental advisory given  Plan Discussed with: CRNA  Anesthesia Plan Comments: (Lab  Results      Component                Value               Date                      WBC                      7.9                 04/06/2020                HGB                      14.0                04/06/2020                HCT                      43.7                04/06/2020                MCV                      89.7                04/06/2020  PLT                      241                 04/06/2020           )       Anesthesia Quick Evaluation

## 2020-04-13 NOTE — Anesthesia Postprocedure Evaluation (Signed)
Anesthesia Post Note  Patient: SHEYLI HORWITZ  Procedure(s) Performed: EXPLORATION OF FORGUT WITH ENDOSCOPY AND HITAL HERNIA CLOSURE, UPPER ENDOSCOPY (N/A Abdomen)     Patient location during evaluation: PACU Anesthesia Type: General Level of consciousness: awake and alert Pain management: pain level controlled Vital Signs Assessment: post-procedure vital signs reviewed and stable Respiratory status: spontaneous breathing, nonlabored ventilation, respiratory function stable and patient connected to nasal cannula oxygen Cardiovascular status: blood pressure returned to baseline and stable Postop Assessment: no apparent nausea or vomiting Anesthetic complications: no   No complications documented.  Last Vitals:  Vitals:   04/13/20 1350 04/13/20 1400  BP:  129/83  Pulse: 67 68  Resp: 15 (!) 21  Temp: 37.3 C   SpO2: 97% 97%    Last Pain:  Vitals:   04/13/20 1350  TempSrc:   PainSc: 2                  Effie Berkshire

## 2020-04-14 ENCOUNTER — Encounter (HOSPITAL_COMMUNITY): Payer: Self-pay | Admitting: Surgery

## 2020-04-14 LAB — CBC
HCT: 37.7 % (ref 36.0–46.0)
Hemoglobin: 12.2 g/dL (ref 12.0–15.0)
MCH: 29 pg (ref 26.0–34.0)
MCHC: 32.4 g/dL (ref 30.0–36.0)
MCV: 89.8 fL (ref 80.0–100.0)
Platelets: 201 10*3/uL (ref 150–400)
RBC: 4.2 MIL/uL (ref 3.87–5.11)
RDW: 12.9 % (ref 11.5–15.5)
WBC: 10.7 10*3/uL — ABNORMAL HIGH (ref 4.0–10.5)
nRBC: 0 % (ref 0.0–0.2)

## 2020-04-14 LAB — BASIC METABOLIC PANEL
Anion gap: 8 (ref 5–15)
BUN: 10 mg/dL (ref 8–23)
CO2: 25 mmol/L (ref 22–32)
Calcium: 8.6 mg/dL — ABNORMAL LOW (ref 8.9–10.3)
Chloride: 108 mmol/L (ref 98–111)
Creatinine, Ser: 0.94 mg/dL (ref 0.44–1.00)
GFR, Estimated: >60 mL/min (ref >60)
Glucose, Bld: 125 mg/dL — ABNORMAL HIGH (ref 70–99)
Potassium: 4.3 mmol/L (ref 3.5–5.1)
Sodium: 141 mmol/L (ref 135–145)

## 2020-04-14 MED ORDER — ONDANSETRON 4 MG PO TBDP: 4.0000 mg | ORAL_TABLET | Freq: Four times a day (QID) | ORAL | 0 refills | Status: DC | PRN

## 2020-04-14 MED ORDER — HYDROCODONE-ACETAMINOPHEN 5-325 MG PO TABS: 1.0000 | ORAL_TABLET | ORAL | 0 refills | Status: DC | PRN

## 2020-04-14 NOTE — Progress Notes (Signed)
Discharge instructions given to patient and all questions were answered.  

## 2020-04-14 NOTE — Discharge Summary (Signed)
Physician Discharge Summary  Patient ID: Bonnie Juarez MRN: [id number] DOB/AGE: [birth year] 75 y.o.  PCP: Cari Caraway, MD  Admit date: 04/13/2020 Discharge date: 04/14/2020  Admission Diagnoses:  Recurrent dysphagia after Nissen fundoplication with slip  Discharge Diagnoses:  same  Active Problems:   History of repair of hiatal hernia   Surgery:  Takedown Nissen to 270 wrap and repair of hiatal hernia  Discharged Condition: improved  Hospital Course:   Had surgery on Tuesday and was begun on clear liquids.  These were advanced to full liquids and she was ready for discharge on Wednesday.  Consults: none  Significant Diagnostic Studies: none    Discharge Exam: Blood pressure 130/78, pulse (!) 57, temperature 97.8 F (36.6 C), temperature source Oral, resp. rate 17, weight 86 kg, SpO2 95 %. Incisions bland  Disposition: Discharge disposition: 01-Home or Self Care       Discharge Instructions    Call MD for:  severe uncontrolled pain   Complete by: As directed    Diet - low sodium heart healthy   Complete by: As directed    Discharge instructions   Complete by: As directed    Full liquids this week;  then advance to soft and to regular diet   Increase activity slowly   Complete by: As directed      Allergies as of 04/14/2020      Reactions   Boniva [ibandronic Acid] Other (See Comments)   Bone Pain   Dilaudid [hydromorphone Hcl] Other (See Comments)   Hallucinations   Risedronate Sodium Other (See Comments)   Bone pain   Statins Other (See Comments)      Medication List    TAKE these medications   acetaminophen 500 MG tablet Commonly known as: TYLENOL Take 500-1,000 mg by mouth every 6 (six) hours as needed (pain).   buPROPion 150 MG 12 hr tablet Commonly known as: WELLBUTRIN SR Take 150 mg by mouth daily.   CANNABIDIOL PO Take 1 drop by mouth 3 (three) times daily as needed (fibromyalgia pain). CBD tincture   conjugated estrogens vaginal  cream Commonly known as: PREMARIN Place 1 Applicatorful vaginally daily as needed (vaginal dryness/irritation).   CoQ10 100 MG Caps Take 100 mg by mouth daily.   cromolyn 5.2 MG/ACT nasal spray Commonly known as: NASALCROM Place 1 spray into both nostrils 2 (two) times daily as needed for allergies.   escitalopram 20 MG tablet Commonly known as: LEXAPRO Take 40 mg by mouth at bedtime.   HYDROcodone-acetaminophen 5-325 MG tablet Commonly known as: NORCO/VICODIN Take 1 tablet by mouth every 4 (four) hours as needed (severe pain).   loratadine 10 MG tablet Commonly known as: CLARITIN Take 10 mg by mouth daily.   olmesartan 20 MG tablet Commonly known as: BENICAR Take 20 mg by mouth daily.   ondansetron 4 MG disintegrating tablet Commonly known as: ZOFRAN-ODT Take 1 tablet (4 mg total) by mouth every 6 (six) hours as needed for nausea.   pantoprazole 40 MG tablet Commonly known as: PROTONIX Take 40 mg by mouth daily before breakfast.   Repatha 140 MG/ML Sosy Generic drug: Evolocumab INJECT 140 MG INTO THE SKIN EVERY 14 (FOURTEEN) DAYS.   Systane 0.4-0.3 % Soln Generic drug: Polyethyl Glycol-Propyl Glycol Place 1-2 drops into both eyes 3 (three) times daily as needed (dry/irritated eyes).   traZODone 100 MG tablet Commonly known as: DESYREL Take 100 mg by mouth at bedtime.   vitamin B-12 1000 MCG tablet Commonly known as: CYANOCOBALAMIN Take  1,000 mcg by mouth daily.   vitamin C 1000 MG tablet Take 1,000 mg by mouth daily.   Vitamin D 50 MCG (2000 UT) tablet Take 2,000 Units by mouth daily.        Signed: Pedro Earls 04/14/2020, 1:40 PM

## 2020-04-14 NOTE — Progress Notes (Signed)
Patient ID: Bonnie Juarez, female   DOB: 03-Dec-1945, 75 y.o.   MRN: [id number] Labs OK. Incision OK Will advance to full liquids.  Anticipate discharge later today.   Kaylyn Lim

## 2020-04-14 NOTE — Discharge Instructions (Signed)
Eating Plan After Nissen Fundoplication After a Nissen fundoplication procedure, it is common to have some difficulty swallowing. The part of your body that moves food and liquid from your mouth to your stomach (esophagus) will be swollen and may feel tight. It will take several weeks or months for your esophagus and stomach to heal. By following a special eating plan, you can prevent problems such as pain, swelling or pressure in the abdomen (bloating), gas, nausea, or diarrhea. What are tips for following this plan? Cooking  Cook all foods until they are soft.  Remove skins and seeds from fruits and vegetables before eating.  Remove skin and gristle from meats. Grind or finely mince meats before eating.  Avoid over-cooking meat. Dry, tough meat is more difficult to swallow.  Avoid using oil when cooking, or use only a small amount of oil.  Avoid using seasoning when cooking, or use only a small amount of seasoning.  Toast bread before eating. This makes it easier to swallow. Meal planning  Eat 6-8 small meals throughout the day.  Right after the surgery, have a few meals that are only clear liquids. Clear liquids include: ? Water. ? Clear fruit juice, no pulp. ? Chicken, beef, or vegetable broth. ? Gelatin. ? Decaffeinated tea or coffee without milk. ? Popsicles or shaved ice.  Depending on your progress, you may move to a full liquid diet as told by your health care provider. This includes clear liquids and the following: ? Dairy and alternative milks, such as soy milk. ? Strained creamed soups. ? Ice cream or sherbet. ? Pudding. ? Nutritional supplement drinks. ? Yogurt.  A few days after surgery, you may be able to start eating a diet of soft foods. You may need to eat according to this plan for several weeks.  Do not eat sweets or sweetened drinks at the beginning of a meal. Doing that may cause your stomach to empty faster than it should (dumping syndrome).    Lifestyle  Always sit upright when eating or drinking.  Eat slowly. Take small bites and chew food well before swallowing.  Do not lie down after eating. Stay sitting up for 30 minutes or longer after each meal.  Sip fluids between meals.  Limit how much you drink at one time. With meals and snacks, have 4-8 oz (120-240 mL). This is equal to  cup-1 cup.  Do not mix solid foods and liquids in the same mouthful.  Drink enough fluid to keep your urine pale yellow.  Do not chew gum or drink fluids through a straw. Doing those things may cause you to swallow extra air. General information  Do not drink carbonated drinks or alcohol.  Avoid foods and drinks that contain caffeine and chocolate.  Avoid foods and drinks that contain citrus or tomato.  Allow hot soups and drinks to cool before eating.  Avoid foods that cause gas, such as beans, peas, broccoli, or cabbage.  If dairy milk products cause diarrhea, avoid them or eat them in small amounts. Recommended foods Fruits Any soft-cooked fruits after skins and seeds are removed. Fruit juice. Vegetables Any soft-cooked vegetables after skins and seeds are removed. Vegetable juice. Grains  Cooked cereals. Dry cereals softened with liquid. Cooked pasta, rice, or other grains. Toasted bread. Bland crackers, such as soda or graham crackers. Meats and other protein foods  Tender cuts of meat, poultry, or fish after bones, skin, and gristle are removed. Poached, boiled, or scrambled eggs. Canned fish. Tofu.  Creamy nut butters. Dairy  Milk. Yogurt. Cottage cheese. Mild cheeses. Beverages  Nutritional supplement drinks. Decaffeinated tea or coffee. Sports drinks. Fats and oils  Butter. Margarine. Mayonnaise. Vegetable oil. Smooth salad dressing. Sweets and desserts  Plain hard candy. Marshmallows. Pudding. Ice cream. Gelatin. Sherbet. Seasoning and other foods  Salt. Light seasonings. Mustard. Vinegar. The items listed  above may not be a complete list of recommended foods and beverages. Contact a dietitian for more information. Foods to avoid Fruits Oranges. Grapefruit. Lemons. Limes. Citrus juices. Dried fruit. Crunchy, raw fruits. Vegetables Tomato sauce. Tomato juice. Broccoli. Cauliflower. Cabbage. Brussels sprouts. Crunchy, raw vegetables. Grains  High-fiber or bran cereal. Cereal with nuts, dried fruit, or coconut. Sweet breads, rolls, coffee cake, or donuts. Chewy or crusty breads. Popcorn. Meats and other protein foods  Beans, peas, and lentils. Tough or fatty meats. Fried meats, chicken, or fish. Fried eggs. Nuts and seeds. Crunchy nut butters. Dairy  Chocolate milk. Yogurt with chunks of fruit, nuts, seeds, or coconut. Strong cheeses. Beverages  Carbonated soft drinks. Alcohol. Cocoa. Hot drinks. Fats and oils  Bacon fat. Lard. Sweets and desserts  Chocolate. Candy with nuts, coconut, or seeds. Peppermint. Cookies. Cakes. Pie crust. Seasoning and other foods  Heavy seasonings. Chili sauce. Ketchup. Barbecue sauce. Angie Fava. Horseradish. The items listed above may not be a complete list of foods and beverages to avoid. Contact a dietitian for more information. Summary  Following this eating plan after a Nissen fundoplication is an important part of healing after surgery.  After surgery, you will start with a clear liquid diet before you progress to full liquids and soft foods. You may need to eat soft foods for several weeks.  Avoid eating foods that cause irritation, gas, nausea, diarrhea, or swelling or pressure in the abdomen (bloating), and avoid foods that are difficult to swallow.  Talk with a dietitian about which dietary choices are best for you. This information is not intended to replace advice given to you by your health care provider. Make sure you discuss any questions you have with your health care provider. Document Revised: 08/31/2019 Document Reviewed:  08/31/2019 Elsevier Patient Education  [place].

## 2020-04-26 DIAGNOSIS — G479 Sleep disorder, unspecified: Secondary | ICD-10-CM | POA: Diagnosis not present

## 2020-04-26 DIAGNOSIS — I10 Essential (primary) hypertension: Secondary | ICD-10-CM | POA: Diagnosis not present

## 2020-04-26 DIAGNOSIS — J309 Allergic rhinitis, unspecified: Secondary | ICD-10-CM | POA: Diagnosis not present

## 2020-04-26 DIAGNOSIS — K219 Gastro-esophageal reflux disease without esophagitis: Secondary | ICD-10-CM | POA: Diagnosis not present

## 2020-04-26 DIAGNOSIS — E782 Mixed hyperlipidemia: Secondary | ICD-10-CM | POA: Diagnosis not present

## 2020-04-26 DIAGNOSIS — Z79899 Other long term (current) drug therapy: Secondary | ICD-10-CM | POA: Diagnosis not present

## 2020-04-26 DIAGNOSIS — F3341 Major depressive disorder, recurrent, in partial remission: Secondary | ICD-10-CM | POA: Diagnosis not present

## 2020-04-26 DIAGNOSIS — M791 Myalgia, unspecified site: Secondary | ICD-10-CM | POA: Diagnosis not present

## 2020-04-26 DIAGNOSIS — R7989 Other specified abnormal findings of blood chemistry: Secondary | ICD-10-CM | POA: Diagnosis not present

## 2020-04-26 DIAGNOSIS — R7303 Prediabetes: Secondary | ICD-10-CM | POA: Diagnosis not present

## 2020-04-26 DIAGNOSIS — M810 Age-related osteoporosis without current pathological fracture: Secondary | ICD-10-CM | POA: Diagnosis not present

## 2020-04-26 DIAGNOSIS — N952 Postmenopausal atrophic vaginitis: Secondary | ICD-10-CM | POA: Diagnosis not present

## 2020-04-30 DIAGNOSIS — J309 Allergic rhinitis, unspecified: Secondary | ICD-10-CM | POA: Diagnosis not present

## 2020-04-30 DIAGNOSIS — R7989 Other specified abnormal findings of blood chemistry: Secondary | ICD-10-CM | POA: Diagnosis not present

## 2020-04-30 DIAGNOSIS — K219 Gastro-esophageal reflux disease without esophagitis: Secondary | ICD-10-CM | POA: Diagnosis not present

## 2020-04-30 DIAGNOSIS — E782 Mixed hyperlipidemia: Secondary | ICD-10-CM | POA: Diagnosis not present

## 2020-04-30 DIAGNOSIS — M810 Age-related osteoporosis without current pathological fracture: Secondary | ICD-10-CM | POA: Diagnosis not present

## 2020-04-30 DIAGNOSIS — M797 Fibromyalgia: Secondary | ICD-10-CM | POA: Diagnosis not present

## 2020-04-30 DIAGNOSIS — F3342 Major depressive disorder, recurrent, in full remission: Secondary | ICD-10-CM | POA: Diagnosis not present

## 2020-04-30 DIAGNOSIS — R87612 Low grade squamous intraepithelial lesion on cytologic smear of cervix (LGSIL): Secondary | ICD-10-CM | POA: Diagnosis not present

## 2020-04-30 DIAGNOSIS — I10 Essential (primary) hypertension: Secondary | ICD-10-CM | POA: Diagnosis not present

## 2020-04-30 DIAGNOSIS — G479 Sleep disorder, unspecified: Secondary | ICD-10-CM | POA: Diagnosis not present

## 2020-04-30 DIAGNOSIS — N952 Postmenopausal atrophic vaginitis: Secondary | ICD-10-CM | POA: Diagnosis not present

## 2020-05-11 DIAGNOSIS — Z86018 Personal history of other benign neoplasm: Secondary | ICD-10-CM | POA: Diagnosis not present

## 2020-05-11 DIAGNOSIS — L821 Other seborrheic keratosis: Secondary | ICD-10-CM | POA: Diagnosis not present

## 2020-05-11 DIAGNOSIS — Z85828 Personal history of other malignant neoplasm of skin: Secondary | ICD-10-CM | POA: Diagnosis not present

## 2020-05-11 DIAGNOSIS — L814 Other melanin hyperpigmentation: Secondary | ICD-10-CM | POA: Diagnosis not present

## 2020-05-11 DIAGNOSIS — L570 Actinic keratosis: Secondary | ICD-10-CM | POA: Diagnosis not present

## 2020-05-11 DIAGNOSIS — L578 Other skin changes due to chronic exposure to nonionizing radiation: Secondary | ICD-10-CM | POA: Diagnosis not present

## 2020-05-11 DIAGNOSIS — D485 Neoplasm of uncertain behavior of skin: Secondary | ICD-10-CM | POA: Diagnosis not present

## 2020-05-11 DIAGNOSIS — L820 Inflamed seborrheic keratosis: Secondary | ICD-10-CM | POA: Diagnosis not present

## 2020-05-11 DIAGNOSIS — Z85820 Personal history of malignant melanoma of skin: Secondary | ICD-10-CM | POA: Diagnosis not present

## 2020-05-11 DIAGNOSIS — D225 Melanocytic nevi of trunk: Secondary | ICD-10-CM | POA: Diagnosis not present

## 2020-05-11 DIAGNOSIS — L719 Rosacea, unspecified: Secondary | ICD-10-CM | POA: Diagnosis not present

## 2020-05-13 DIAGNOSIS — M810 Age-related osteoporosis without current pathological fracture: Secondary | ICD-10-CM | POA: Diagnosis not present

## 2020-05-24 ENCOUNTER — Other Ambulatory Visit (HOSPITAL_COMMUNITY): Payer: Self-pay | Admitting: Surgery

## 2020-05-24 DIAGNOSIS — Z87898 Personal history of other specified conditions: Secondary | ICD-10-CM

## 2020-05-24 DIAGNOSIS — K30 Functional dyspepsia: Secondary | ICD-10-CM

## 2020-05-26 ENCOUNTER — Ambulatory Visit (HOSPITAL_COMMUNITY)
Admission: RE | Admit: 2020-05-26 | Discharge: 2020-05-26 | Disposition: A | Discharge status: Home / Self Care | Payer: PPO | Source: Ambulatory Visit | Attending: Surgery | Admitting: Surgery

## 2020-05-26 ENCOUNTER — Other Ambulatory Visit: Payer: Self-pay

## 2020-05-26 DIAGNOSIS — K224 Dyskinesia of esophagus: Secondary | ICD-10-CM | POA: Diagnosis not present

## 2020-05-26 DIAGNOSIS — Z87898 Personal history of other specified conditions: Secondary | ICD-10-CM | POA: Diagnosis not present

## 2020-05-26 DIAGNOSIS — K219 Gastro-esophageal reflux disease without esophagitis: Secondary | ICD-10-CM | POA: Diagnosis not present

## 2020-05-26 DIAGNOSIS — K30 Functional dyspepsia: Secondary | ICD-10-CM | POA: Diagnosis not present

## 2020-05-27 DIAGNOSIS — F3342 Major depressive disorder, recurrent, in full remission: Secondary | ICD-10-CM | POA: Diagnosis not present

## 2020-05-27 DIAGNOSIS — E782 Mixed hyperlipidemia: Secondary | ICD-10-CM | POA: Diagnosis not present

## 2020-05-27 DIAGNOSIS — I10 Essential (primary) hypertension: Secondary | ICD-10-CM | POA: Diagnosis not present

## 2020-05-27 DIAGNOSIS — K219 Gastro-esophageal reflux disease without esophagitis: Secondary | ICD-10-CM | POA: Diagnosis not present

## 2020-05-27 DIAGNOSIS — M810 Age-related osteoporosis without current pathological fracture: Secondary | ICD-10-CM | POA: Diagnosis not present

## 2020-06-17 DIAGNOSIS — F3342 Major depressive disorder, recurrent, in full remission: Secondary | ICD-10-CM | POA: Diagnosis not present

## 2020-06-17 DIAGNOSIS — K219 Gastro-esophageal reflux disease without esophagitis: Secondary | ICD-10-CM | POA: Diagnosis not present

## 2020-06-17 DIAGNOSIS — I10 Essential (primary) hypertension: Secondary | ICD-10-CM | POA: Diagnosis not present

## 2020-06-17 DIAGNOSIS — M810 Age-related osteoporosis without current pathological fracture: Secondary | ICD-10-CM | POA: Diagnosis not present

## 2020-06-17 DIAGNOSIS — E782 Mixed hyperlipidemia: Secondary | ICD-10-CM | POA: Diagnosis not present

## 2020-08-07 ENCOUNTER — Other Ambulatory Visit: Payer: Self-pay | Admitting: Internal Medicine

## 2020-08-12 DIAGNOSIS — I10 Essential (primary) hypertension: Secondary | ICD-10-CM | POA: Diagnosis not present

## 2020-08-12 DIAGNOSIS — F3342 Major depressive disorder, recurrent, in full remission: Secondary | ICD-10-CM | POA: Diagnosis not present

## 2020-08-12 DIAGNOSIS — M810 Age-related osteoporosis without current pathological fracture: Secondary | ICD-10-CM | POA: Diagnosis not present

## 2020-08-12 DIAGNOSIS — E782 Mixed hyperlipidemia: Secondary | ICD-10-CM | POA: Diagnosis not present

## 2020-08-12 DIAGNOSIS — K219 Gastro-esophageal reflux disease without esophagitis: Secondary | ICD-10-CM | POA: Diagnosis not present

## 2020-08-17 ENCOUNTER — Other Ambulatory Visit: Payer: Self-pay | Admitting: Surgery

## 2020-08-17 DIAGNOSIS — R1310 Dysphagia, unspecified: Secondary | ICD-10-CM

## 2020-09-03 ENCOUNTER — Other Ambulatory Visit: Payer: PPO

## 2020-09-06 ENCOUNTER — Ambulatory Visit
Admission: RE | Admit: 2020-09-06 | Discharge: 2020-09-06 | Disposition: A | Discharge status: Home / Self Care | Payer: PPO | Source: Ambulatory Visit | Attending: Surgery | Admitting: Surgery

## 2020-09-06 DIAGNOSIS — K219 Gastro-esophageal reflux disease without esophagitis: Secondary | ICD-10-CM | POA: Diagnosis not present

## 2020-09-06 DIAGNOSIS — R1310 Dysphagia, unspecified: Secondary | ICD-10-CM

## 2020-09-06 DIAGNOSIS — K449 Diaphragmatic hernia without obstruction or gangrene: Secondary | ICD-10-CM | POA: Diagnosis not present

## 2020-10-13 DIAGNOSIS — K219 Gastro-esophageal reflux disease without esophagitis: Secondary | ICD-10-CM | POA: Diagnosis not present

## 2020-10-13 DIAGNOSIS — F3341 Major depressive disorder, recurrent, in partial remission: Secondary | ICD-10-CM | POA: Diagnosis not present

## 2020-10-13 DIAGNOSIS — M810 Age-related osteoporosis without current pathological fracture: Secondary | ICD-10-CM | POA: Diagnosis not present

## 2020-10-13 DIAGNOSIS — I10 Essential (primary) hypertension: Secondary | ICD-10-CM | POA: Diagnosis not present

## 2020-10-13 DIAGNOSIS — E782 Mixed hyperlipidemia: Secondary | ICD-10-CM | POA: Diagnosis not present

## 2020-10-21 ENCOUNTER — Other Ambulatory Visit: Payer: Self-pay | Admitting: Family Medicine

## 2020-10-21 DIAGNOSIS — Z1231 Encounter for screening mammogram for malignant neoplasm of breast: Secondary | ICD-10-CM

## 2020-11-04 DIAGNOSIS — I10 Essential (primary) hypertension: Secondary | ICD-10-CM | POA: Diagnosis not present

## 2020-11-04 DIAGNOSIS — R87612 Low grade squamous intraepithelial lesion on cytologic smear of cervix (LGSIL): Secondary | ICD-10-CM | POA: Diagnosis not present

## 2020-11-04 DIAGNOSIS — M810 Age-related osteoporosis without current pathological fracture: Secondary | ICD-10-CM | POA: Diagnosis not present

## 2020-11-04 DIAGNOSIS — Z23 Encounter for immunization: Secondary | ICD-10-CM | POA: Diagnosis not present

## 2020-11-04 DIAGNOSIS — Z0000 Encounter for general adult medical examination without abnormal findings: Secondary | ICD-10-CM | POA: Diagnosis not present

## 2020-11-04 DIAGNOSIS — R7989 Other specified abnormal findings of blood chemistry: Secondary | ICD-10-CM | POA: Diagnosis not present

## 2020-11-09 DIAGNOSIS — I10 Essential (primary) hypertension: Secondary | ICD-10-CM | POA: Diagnosis not present

## 2020-11-09 DIAGNOSIS — J309 Allergic rhinitis, unspecified: Secondary | ICD-10-CM | POA: Diagnosis not present

## 2020-11-09 DIAGNOSIS — G479 Sleep disorder, unspecified: Secondary | ICD-10-CM | POA: Diagnosis not present

## 2020-11-09 DIAGNOSIS — F3342 Major depressive disorder, recurrent, in full remission: Secondary | ICD-10-CM | POA: Diagnosis not present

## 2020-11-09 DIAGNOSIS — M797 Fibromyalgia: Secondary | ICD-10-CM | POA: Diagnosis not present

## 2020-11-09 DIAGNOSIS — E782 Mixed hyperlipidemia: Secondary | ICD-10-CM | POA: Diagnosis not present

## 2020-11-09 DIAGNOSIS — K219 Gastro-esophageal reflux disease without esophagitis: Secondary | ICD-10-CM | POA: Diagnosis not present

## 2020-11-09 DIAGNOSIS — M810 Age-related osteoporosis without current pathological fracture: Secondary | ICD-10-CM | POA: Diagnosis not present

## 2020-11-09 DIAGNOSIS — N3941 Urge incontinence: Secondary | ICD-10-CM | POA: Diagnosis not present

## 2020-11-09 DIAGNOSIS — Z79899 Other long term (current) drug therapy: Secondary | ICD-10-CM | POA: Diagnosis not present

## 2020-11-10 ENCOUNTER — Other Ambulatory Visit (HOSPITAL_COMMUNITY): Payer: Self-pay

## 2020-11-15 ENCOUNTER — Encounter (HOSPITAL_COMMUNITY)
Admission: RE | Admit: 2020-11-15 | Discharge: 2020-11-15 | Disposition: A | Discharge status: Home / Self Care | Payer: PPO | Source: Ambulatory Visit | Attending: Surgery | Admitting: Surgery

## 2020-11-15 ENCOUNTER — Encounter (HOSPITAL_COMMUNITY): Payer: Self-pay

## 2020-11-15 ENCOUNTER — Other Ambulatory Visit: Payer: Self-pay

## 2020-11-15 DIAGNOSIS — M810 Age-related osteoporosis without current pathological fracture: Secondary | ICD-10-CM | POA: Diagnosis not present

## 2020-11-15 DIAGNOSIS — Z01818 Encounter for other preprocedural examination: Secondary | ICD-10-CM | POA: Diagnosis not present

## 2020-11-15 LAB — CBC
HCT: 39.7 % (ref 36.0–46.0)
Hemoglobin: 12.9 g/dL (ref 12.0–15.0)
MCH: 28.6 pg (ref 26.0–34.0)
MCHC: 32.5 g/dL (ref 30.0–36.0)
MCV: 88 fL (ref 80.0–100.0)
Platelets: 229 10*3/uL (ref 150–400)
RBC: 4.51 MIL/uL (ref 3.87–5.11)
RDW: 13.5 % (ref 11.5–15.5)
WBC: 8.2 10*3/uL (ref 4.0–10.5)
nRBC: 0 % (ref 0.0–0.2)

## 2020-11-15 LAB — BASIC METABOLIC PANEL
Anion gap: 7 (ref 5–15)
BUN: 13 mg/dL (ref 8–23)
CO2: 27 mmol/L (ref 22–32)
Calcium: 9.7 mg/dL (ref 8.9–10.3)
Chloride: 106 mmol/L (ref 98–111)
Creatinine, Ser: 0.81 mg/dL (ref 0.44–1.00)
GFR, Estimated: >60 mL/min (ref >60)
Glucose, Bld: 137 mg/dL — ABNORMAL HIGH (ref 70–99)
Potassium: 4.2 mmol/L (ref 3.5–5.1)
Sodium: 140 mmol/L (ref 135–145)

## 2020-11-15 NOTE — Progress Notes (Signed)
DUE TO COVID-19 ONLY ONE VISITOR IS ALLOWED TO COME WITH YOU AND STAY IN THE WAITING ROOM ONLY DURING PRE OP AND PROCEDURE DAY OF SURGERY. THE 1 VISITOR  MAY VISIT WITH YOU AFTER SURGERY IN YOUR PRIVATE ROOM DURING VISITING HOURS ONLY!  YOU NEED TO HAVE A COVID 19 TEST ON__10/07/2020 _____ '@_______'$ , THIS TEST MUST BE DONE BEFORE SURGERY,  COVID TESTING SITE IS AT [facility]. PLEASE REMAIN IN YOUR CAR THIS IS A DRIVER UP TEST. AFTER YOUR COVID TEST PLEASE WEAR A MASK OUT IN PUBLIC AND SOCIAL DISTANCE AND [place] YOUR HANDS FREQUENTLY. PLEASE ASK ALL YOUR CLOSE CONTACTS TO WEAR A MASK OUT IN PUBLIC AND SOCIAL DISTANCE AND [place] HANDS FREQUENTLY ALSO.               Bonnie Juarez  11/15/2020   Your procedure is scheduled on:  12/06/2020   Report to [facility] Main  Entrance   Report to admitting at    1015AM     Call this number if you have problems the morning of surgery [phone]    Remember: Do not eat food , candy gum or mints :After Midnight. You may have clear liquids from midnight until __ 0930am    CLEAR LIQUID DIET   Foods Allowed                                                                       Coffee and tea, regular and decaf                              Plain Jell-O any favor except red or purple                                            Fruit ices (not with fruit pulp)                                      Iced Popsicles                                     Carbonated beverages, regular and diet                                    Cranberry, grape and apple juices Sports drinks like Gatorade Lightly seasoned clear broth or consume(fat free) Sugar   _____________________________________________________________________    BRUSH YOUR TEETH MORNING OF SURGERY AND RINSE YOUR MOUTH OUT, NO CHEWING GUM CANDY OR MINTS.     Take these medicines the morning of surgery with A SIP OF WATER: claritin, protonix   DO NOT TAKE ANY DIABETIC  MEDICATIONS DAY OF YOUR SURGERY  You may not have any metal on your body including hair pins and              piercings  Do not wear jewelry, make-up, lotions, powders or perfumes, deodorant             Do not wear nail polish on your fingernails.  Do not shave  48 hours prior to surgery.              Men may shave face and neck.   Do not bring valuables to the hospital. [place].  Contacts, dentures or bridgework may not be worn into surgery.  Leave suitcase in the car. After surgery it may be brought to your room.     Patients discharged the day of surgery will not be allowed to drive home. IF YOU ARE HAVING SURGERY AND GOING HOME THE SAME DAY, YOU MUST HAVE AN ADULT TO DRIVE YOU HOME AND BE WITH YOU FOR 24 HOURS. YOU MAY GO HOME BY TAXI OR UBER OR ORTHERWISE, BUT AN ADULT MUST ACCOMPANY YOU HOME AND STAY WITH YOU FOR 24 HOURS.  Name and phone number of your driver:  Special Instructions: N/A              Please read over the following fact sheets you were given: _____________________________________________________________________  [facility] - Preparing for Surgery Before surgery, you can play an important role.  Because skin is not sterile, your skin needs to be as free of germs as possible.  You can reduce the number of germs on your skin by washing with CHG (chlorahexidine gluconate) soap before surgery.  CHG is an antiseptic cleaner which kills germs and bonds with the skin to continue killing germs even after washing. Please DO NOT use if you have an allergy to CHG or antibacterial soaps.  If your skin becomes reddened/irritated stop using the CHG and inform your nurse when you arrive at Short Stay. Do not shave (including legs and underarms) for at least 48 hours prior to the first CHG shower.  You may shave your face/neck. Please follow these instructions carefully:  1.  Shower with CHG Soap the night  before surgery and the  morning of Surgery.  2.  If you choose to wash your hair, wash your hair first as usual with your  normal  shampoo.  3.  After you shampoo, rinse your hair and body thoroughly to remove the  shampoo.                           4.  Use CHG as you would any other liquid soap.  You can apply chg directly  to the skin and wash                       Gently with a scrungie or clean washcloth.  5.  Apply the CHG Soap to your body ONLY FROM THE NECK DOWN.   Do not use on face/ open                           Wound or open sores. Avoid contact with eyes, ears mouth and genitals (private parts).  Wash face,  Genitals (private parts) with your normal soap.             6.  Wash thoroughly, paying special attention to the area where your surgery  will be performed.  7.  Thoroughly rinse your body with warm water from the neck down.  8.  DO NOT shower/wash with your normal soap after using and rinsing off  the CHG Soap.                9.  Pat yourself dry with a clean towel.            10.  Wear clean pajamas.            11.  Place clean sheets on your bed the night of your first shower and do not  sleep with pets. Day of Surgery : Do not apply any lotions/deodorants the morning of surgery.  Please wear clean clothes to the hospital/surgery center.  FAILURE TO FOLLOW THESE INSTRUCTIONS MAY RESULT IN THE CANCELLATION OF YOUR SURGERY PATIENT SIGNATURE_________________________________  NURSE SIGNATURE__________________________________  ________________________________________________________________________

## 2020-11-15 NOTE — Progress Notes (Addendum)
Anesthesia Review:  PCP: DR Cari Caraway - LOV 08/12/20.   Cardiologist : DR Lyman Bishop- Last visit- video visit on 03/08/20  Chest x-ray : EKG : 04/06/20  Echo : Stress test: Cardiac Cath :  Activity level: can do a flght of stairs without difficulty Sleep Study/ CPAP : none  Fasting Blood Sugar :      / Checks Blood Sugar -- times a day:   Blood Thinner/ Instructions /Last Dose: ASA / Instructions/ Last Dose :   No orders in at time of preop appt.  Requested on 11/11/2020.   Covid test on 12/02/20  In 2005 at [facility]  had decreased LOC and hypoxia over medicated on Morphine and Phenergan per pt received Narcan x 2 had surgery with DR Hassell Done 03/2020- pt reported no problems  Covid test on 12/02/20.

## 2020-12-02 ENCOUNTER — Other Ambulatory Visit: Payer: Self-pay | Admitting: Surgery

## 2020-12-02 ENCOUNTER — Other Ambulatory Visit: Payer: Self-pay

## 2020-12-02 ENCOUNTER — Ambulatory Visit
Admission: RE | Admit: 2020-12-02 | Discharge: 2020-12-02 | Disposition: A | Discharge status: Home / Self Care | Payer: PPO | Source: Ambulatory Visit | Attending: Family Medicine | Admitting: Family Medicine

## 2020-12-02 DIAGNOSIS — Z1231 Encounter for screening mammogram for malignant neoplasm of breast: Secondary | ICD-10-CM | POA: Diagnosis not present

## 2020-12-03 LAB — SARS CORONAVIRUS 2 (TAT 6-24 HRS): SARS Coronavirus 2: NEGATIVE

## 2020-12-05 NOTE — H&P (Signed)
History of Present Illness: Bonnie Juarez is a 75 y.o. female who is seen today for follow-up for reflux. She is endured several months of recurrent issues. Study brings a question to her motility. She does have a recurrence although smaller diaphragmatic defect. She states she is able to swallow easier with the partial fundoplication. It may be the need to take her to the operating room ductus repair with some absorbable mesh. I explained this to her son. She is aware of the risk benefits as she has had this before.  We discussed the procedure in preop.  Recently she is swallowing better.  She has some indigestion which is likely due to reduced motility.  Will plan robotic exploration of the foregut.   Review of Systems: See HPI as well for other ROS.  ROS   Medical History: Past Medical History:  Diagnosis Date   Anxiety   Arthritis   History of cancer   Hypertension   There is no problem list on file for this patient.  Past Surgical History:  Procedure Laterality Date   Exploration of Foregut with Endoscopy and Hiatal Hernia Closure 04/13/2020  Dr. Hassell Done    Allergies  Allergen Reactions   Hydromorphone Hallucination and Other (See Comments)  Hallucinations   Statins-Hmg-Coa Reductase Inhibitors Other (See Comments)   Current Outpatient Medications on File Prior to Visit  Medication Sig Dispense Refill   buPROPion (WELLBUTRIN XL) 150 MG XL tablet Take 150 mg by mouth every morning   escitalopram oxalate (LEXAPRO) 20 MG tablet escitalopram 20 mg tablet   olmesartan (BENICAR) 20 MG tablet Take 20 mg by mouth once daily   pantoprazole (PROTONIX) 40 MG DR tablet 1 TABLET ORALLY ONCE A DAY 90 DAYS 90 DAYS   sucralfate (CARAFATE) 1 gram tablet Take 1 g by mouth 2 (two) times daily   traZODone (DESYREL) 50 MG tablet trazodone 50 mg tablet   No current facility-administered medications on file prior to visit.   Family History  Problem Relation Age of Onset   Stroke Mother    Obesity Sister   High blood pressure (Hypertension) Sister   Diabetes Sister   Coronary Artery Disease (Blocked arteries around heart) Brother   Hyperlipidemia (Elevated cholesterol) Brother   Diabetes Brother    Social History   Tobacco Use  Smoking Status Former Smoker   Quit date: 1982   Years since quitting: 40.6  Smokeless Tobacco Never Used    Social History   Socioeconomic History   Marital status: Unknown  Tobacco Use   Smoking status: Former Smoker  Quit date: 1982  Years since quitting: 40.6   Smokeless tobacco: Never Used  Substance and Sexual Activity   Alcohol use: Yes  Comment: Wine   Drug use: Never   Objective:   There were no vitals filed for this visit.  There is no height or weight on file to calculate BMI.  Physical Exam General: Generally unchanged. She has not lost weight HEENT:. Not hoarse Chest:clear Heart:SR Breast:not examined Abdomen:nontender GUnot examined Rectalnot examined Extremitiesnot examined Neuro alert and oriented x 3  Labs, Imaging and Diagnostic Testing:  UGI reviewed. Good study--motility diminished  Assessment and Plan:  Diagnoses and all orders for this visit:  Slipped Nissen fundoplication    Recurrent hiatal hernia with partial wrap above diaphragm. Will reexplore with robot.   Taronda Comacho Donia Pounds, MD

## 2020-12-06 ENCOUNTER — Inpatient Hospital Stay (HOSPITAL_COMMUNITY): Payer: PPO | Admitting: Physician Assistant

## 2020-12-06 ENCOUNTER — Observation Stay (HOSPITAL_COMMUNITY)
Admission: RE | Admit: 2020-12-06 | Discharge: 2020-12-07 | Disposition: A | Discharge status: Home / Self Care | Payer: PPO | Source: Ambulatory Visit | Attending: Surgery | Admitting: Surgery

## 2020-12-06 ENCOUNTER — Encounter (HOSPITAL_COMMUNITY)
Admission: RE | Disposition: A | Discharge status: Home / Self Care | Payer: Self-pay | Source: Ambulatory Visit | Attending: Surgery

## 2020-12-06 ENCOUNTER — Inpatient Hospital Stay (HOSPITAL_COMMUNITY): Payer: PPO | Admitting: Certified Registered"

## 2020-12-06 ENCOUNTER — Encounter (HOSPITAL_COMMUNITY): Payer: Self-pay | Admitting: Surgery

## 2020-12-06 DIAGNOSIS — Z8589 Personal history of malignant neoplasm of other organs and systems: Secondary | ICD-10-CM | POA: Insufficient documentation

## 2020-12-06 DIAGNOSIS — K3184 Gastroparesis: Secondary | ICD-10-CM | POA: Diagnosis not present

## 2020-12-06 DIAGNOSIS — Z87891 Personal history of nicotine dependence: Secondary | ICD-10-CM | POA: Diagnosis not present

## 2020-12-06 DIAGNOSIS — K449 Diaphragmatic hernia without obstruction or gangrene: Principal | ICD-10-CM | POA: Insufficient documentation

## 2020-12-06 DIAGNOSIS — Z79899 Other long term (current) drug therapy: Secondary | ICD-10-CM | POA: Insufficient documentation

## 2020-12-06 DIAGNOSIS — K219 Gastro-esophageal reflux disease without esophagitis: Secondary | ICD-10-CM | POA: Diagnosis not present

## 2020-12-06 DIAGNOSIS — I10 Essential (primary) hypertension: Secondary | ICD-10-CM | POA: Diagnosis not present

## 2020-12-06 DIAGNOSIS — R1310 Dysphagia, unspecified: Secondary | ICD-10-CM | POA: Diagnosis not present

## 2020-12-06 DIAGNOSIS — F418 Other specified anxiety disorders: Secondary | ICD-10-CM | POA: Diagnosis not present

## 2020-12-06 DIAGNOSIS — E782 Mixed hyperlipidemia: Secondary | ICD-10-CM | POA: Diagnosis not present

## 2020-12-06 HISTORY — PX: XI ROBOTIC ASSISTED HIATAL HERNIA REPAIR: SHX6889

## 2020-12-06 LAB — CREATININE, SERUM
Creatinine, Ser: 0.78 mg/dL (ref 0.44–1.00)
GFR, Estimated: >60 mL/min (ref >60)

## 2020-12-06 LAB — CBC
HCT: 42 % (ref 36.0–46.0)
Hemoglobin: 13.6 g/dL (ref 12.0–15.0)
MCH: 29.2 pg (ref 26.0–34.0)
MCHC: 32.4 g/dL (ref 30.0–36.0)
MCV: 90.3 fL (ref 80.0–100.0)
Platelets: 202 10*3/uL (ref 150–400)
RBC: 4.65 MIL/uL (ref 3.87–5.11)
RDW: 14.4 % (ref 11.5–15.5)
WBC: 9.6 10*3/uL (ref 4.0–10.5)
nRBC: 0 % (ref 0.0–0.2)

## 2020-12-06 SURGERY — REPAIR, HERNIA, HIATAL, ROBOT-ASSISTED
Anesthesia: General | Site: Abdomen

## 2020-12-06 MED ORDER — ONDANSETRON 4 MG PO TBDP: 4.0000 mg | ORAL_TABLET | Freq: Four times a day (QID) | ORAL | Status: DC | PRN

## 2020-12-06 MED ORDER — EPHEDRINE 5 MG/ML INJ
INTRAVENOUS | Status: AC
  Filled 2020-12-06: qty 5

## 2020-12-06 MED ORDER — PROPOFOL 500 MG/50ML IV EMUL
INTRAVENOUS | Status: AC
  Filled 2020-12-06: qty 50

## 2020-12-06 MED ORDER — METOPROLOL TARTRATE 5 MG/5ML IV SOLN: 5.0000 mg | Freq: Four times a day (QID) | INTRAVENOUS | Status: DC | PRN

## 2020-12-06 MED ORDER — SODIUM CHLORIDE (PF) 0.9 % IJ SOLN
INTRAMUSCULAR | Status: DC | PRN
  Administered 2020-12-06: 10 mL

## 2020-12-06 MED ORDER — 0.9 % SODIUM CHLORIDE (POUR BTL) OPTIME
TOPICAL | Status: DC | PRN
  Administered 2020-12-06: 1000 mL

## 2020-12-06 MED ORDER — PROPOFOL 10 MG/ML IV BOLUS
INTRAVENOUS | Status: DC | PRN
  Administered 2020-12-06: 130 mg via INTRAVENOUS
  Administered 2020-12-06: 40 mg via INTRAVENOUS

## 2020-12-06 MED ORDER — ONDANSETRON HCL 4 MG/2ML IJ SOLN
INTRAMUSCULAR | Status: DC | PRN
  Administered 2020-12-06: 4 mg via INTRAVENOUS

## 2020-12-06 MED ORDER — ROCURONIUM BROMIDE 10 MG/ML (PF) SYRINGE
PREFILLED_SYRINGE | INTRAVENOUS | Status: AC
  Filled 2020-12-06: qty 10

## 2020-12-06 MED ORDER — CHLORHEXIDINE GLUCONATE CLOTH 2 % EX PADS: 6.0000 | MEDICATED_PAD | Freq: Once | CUTANEOUS | Status: DC

## 2020-12-06 MED ORDER — KCL IN DEXTROSE-NACL 20-5-0.45 MEQ/L-%-% IV SOLN
INTRAVENOUS | Status: DC
  Filled 2020-12-06: qty 1000

## 2020-12-06 MED ORDER — FENTANYL CITRATE (PF) 250 MCG/5ML IJ SOLN
INTRAMUSCULAR | Status: AC
  Filled 2020-12-06: qty 5

## 2020-12-06 MED ORDER — HEPARIN SODIUM (PORCINE) 5000 UNIT/ML IJ SOLN
5000.0000 [IU] | Freq: Three times a day (TID) | INTRAMUSCULAR | Status: DC
  Administered 2020-12-06 – 2020-12-07 (×2): 5000 [IU] via SUBCUTANEOUS
  Filled 2020-12-06 (×2): qty 1

## 2020-12-06 MED ORDER — ONDANSETRON HCL 4 MG/2ML IJ SOLN: INTRAMUSCULAR | Status: DC | PRN

## 2020-12-06 MED ORDER — PROPOFOL 10 MG/ML IV BOLUS
INTRAVENOUS | Status: AC
  Filled 2020-12-06: qty 20

## 2020-12-06 MED ORDER — DEXAMETHASONE SODIUM PHOSPHATE 10 MG/ML IJ SOLN
INTRAMUSCULAR | Status: AC
  Filled 2020-12-06: qty 1

## 2020-12-06 MED ORDER — PANTOPRAZOLE SODIUM 40 MG IV SOLR
40.0000 mg | Freq: Every day | INTRAVENOUS | Status: DC
  Administered 2020-12-06: 40 mg via INTRAVENOUS
  Filled 2020-12-06: qty 40

## 2020-12-06 MED ORDER — FENTANYL CITRATE (PF) 100 MCG/2ML IJ SOLN
INTRAMUSCULAR | Status: AC
  Filled 2020-12-06: qty 2

## 2020-12-06 MED ORDER — BUPIVACAINE LIPOSOME 1.3 % IJ SUSP
INTRAMUSCULAR | Status: DC | PRN
  Administered 2020-12-06: 20 mL

## 2020-12-06 MED ORDER — ONDANSETRON HCL 4 MG/2ML IJ SOLN
INTRAMUSCULAR | Status: AC
  Filled 2020-12-06: qty 2

## 2020-12-06 MED ORDER — LACTATED RINGERS IV SOLN: INTRAVENOUS | Status: DC

## 2020-12-06 MED ORDER — LABETALOL HCL 5 MG/ML IV SOLN
INTRAVENOUS | Status: DC | PRN
  Administered 2020-12-06: 2.5 mg via INTRAVENOUS
  Administered 2020-12-06: 5 mg via INTRAVENOUS

## 2020-12-06 MED ORDER — BUPIVACAINE LIPOSOME 1.3 % IJ SUSP
INTRAMUSCULAR | Status: AC
  Filled 2020-12-06: qty 20

## 2020-12-06 MED ORDER — LIDOCAINE 2% (20 MG/ML) 5 ML SYRINGE
INTRAMUSCULAR | Status: DC | PRN
  Administered 2020-12-06: 100 mg via INTRAVENOUS

## 2020-12-06 MED ORDER — ORAL CARE MOUTH RINSE: 15.0000 mL | Freq: Once | OROMUCOSAL | Status: AC

## 2020-12-06 MED ORDER — CHLORHEXIDINE GLUCONATE 0.12 % MT SOLN
15.0000 mL | Freq: Once | OROMUCOSAL | Status: AC
  Administered 2020-12-06: 15 mL via OROMUCOSAL

## 2020-12-06 MED ORDER — ACETAMINOPHEN 500 MG PO TABS
1000.0000 mg | ORAL_TABLET | Freq: Once | ORAL | Status: AC
  Administered 2020-12-06: 1000 mg via ORAL
  Filled 2020-12-06: qty 2

## 2020-12-06 MED ORDER — SUGAMMADEX SODIUM 200 MG/2ML IV SOLN
INTRAVENOUS | Status: DC | PRN
  Administered 2020-12-06: 200 mg via INTRAVENOUS

## 2020-12-06 MED ORDER — FENTANYL CITRATE PF 50 MCG/ML IJ SOSY
25.0000 ug | PREFILLED_SYRINGE | INTRAMUSCULAR | Status: DC | PRN
  Administered 2020-12-06 (×3): 50 ug via INTRAVENOUS

## 2020-12-06 MED ORDER — PROPOFOL 500 MG/50ML IV EMUL
INTRAVENOUS | Status: DC | PRN
  Administered 2020-12-06: 25 ug/kg/min via INTRAVENOUS

## 2020-12-06 MED ORDER — SCOPOLAMINE 1 MG/3DAYS TD PT72
1.0000 | MEDICATED_PATCH | TRANSDERMAL | Status: DC
  Administered 2020-12-06: 1.5 mg via TRANSDERMAL
  Filled 2020-12-06: qty 1

## 2020-12-06 MED ORDER — PHENYLEPHRINE HCL-NACL 20-0.9 MG/250ML-% IV SOLN
INTRAVENOUS | Status: DC | PRN
  Administered 2020-12-06: 25 ug/min via INTRAVENOUS

## 2020-12-06 MED ORDER — SODIUM CHLORIDE 0.9 % IV SOLN
2.0000 g | INTRAVENOUS | Status: AC
  Administered 2020-12-06: 2 g via INTRAVENOUS
  Filled 2020-12-06: qty 2

## 2020-12-06 MED ORDER — FENTANYL CITRATE PF 50 MCG/ML IJ SOSY
PREFILLED_SYRINGE | INTRAMUSCULAR | Status: AC
  Filled 2020-12-06: qty 1

## 2020-12-06 MED ORDER — FENTANYL CITRATE (PF) 250 MCG/5ML IJ SOLN
INTRAMUSCULAR | Status: DC | PRN
  Administered 2020-12-06: 100 ug via INTRAVENOUS
  Administered 2020-12-06 (×5): 50 ug via INTRAVENOUS

## 2020-12-06 MED ORDER — PROCHLORPERAZINE EDISYLATE 10 MG/2ML IJ SOLN
12.5000 mg | Freq: Four times a day (QID) | INTRAMUSCULAR | Status: DC | PRN
  Administered 2020-12-06: 12.5 mg via INTRAVENOUS
  Filled 2020-12-06 (×2): qty 4

## 2020-12-06 MED ORDER — DEXAMETHASONE SODIUM PHOSPHATE 10 MG/ML IJ SOLN
INTRAMUSCULAR | Status: DC | PRN
  Administered 2020-12-06: 7 mg via INTRAVENOUS

## 2020-12-06 MED ORDER — AMISULPRIDE (ANTIEMETIC) 5 MG/2ML IV SOLN: 10.0000 mg | Freq: Once | INTRAVENOUS | Status: DC | PRN

## 2020-12-06 MED ORDER — ONDANSETRON HCL 4 MG/2ML IJ SOLN
4.0000 mg | Freq: Four times a day (QID) | INTRAMUSCULAR | Status: DC | PRN
  Administered 2020-12-06: 4 mg via INTRAVENOUS
  Filled 2020-12-06: qty 2

## 2020-12-06 MED ORDER — ROCURONIUM BROMIDE 10 MG/ML (PF) SYRINGE
PREFILLED_SYRINGE | INTRAVENOUS | Status: DC | PRN
  Administered 2020-12-06: 20 mg via INTRAVENOUS
  Administered 2020-12-06: 50 mg via INTRAVENOUS

## 2020-12-06 MED ORDER — OXYCODONE HCL 5 MG/5ML PO SOLN
5.0000 mg | Freq: Once | ORAL | Status: DC | PRN
Start: 2020-12-06 — End: 2020-12-06

## 2020-12-06 MED ORDER — LIDOCAINE 2% (20 MG/ML) 5 ML SYRINGE
INTRAMUSCULAR | Status: DC | PRN
Start: 2020-12-06 — End: 2020-12-06
  Administered 2020-12-06: 1.5 mg/kg/h via INTRAVENOUS

## 2020-12-06 MED ORDER — FENTANYL CITRATE PF 50 MCG/ML IJ SOSY
12.5000 ug | PREFILLED_SYRINGE | INTRAMUSCULAR | Status: DC | PRN
  Administered 2020-12-06: 12.5 ug via INTRAVENOUS
  Filled 2020-12-06: qty 1

## 2020-12-06 MED ORDER — OXYCODONE HCL 5 MG PO TABS: 5.0000 mg | ORAL_TABLET | Freq: Once | ORAL | Status: DC | PRN

## 2020-12-06 MED ORDER — PHENYLEPHRINE 40 MCG/ML (10ML) SYRINGE FOR IV PUSH (FOR BLOOD PRESSURE SUPPORT): PREFILLED_SYRINGE | INTRAVENOUS | Status: DC | PRN

## 2020-12-06 MED ORDER — EPHEDRINE SULFATE 50 MG/ML IJ SOLN
INTRAMUSCULAR | Status: DC | PRN
  Administered 2020-12-06: 5 mg via INTRAVENOUS

## 2020-12-06 MED ORDER — FENTANYL CITRATE PF 50 MCG/ML IJ SOSY
PREFILLED_SYRINGE | INTRAMUSCULAR | Status: AC
  Filled 2020-12-06: qty 2

## 2020-12-06 MED ORDER — LABETALOL HCL 5 MG/ML IV SOLN
INTRAVENOUS | Status: AC
  Filled 2020-12-06: qty 4

## 2020-12-06 SURGICAL SUPPLY — 63 items
ADH SKN CLS APL DERMABOND .7 (GAUZE/BANDAGES/DRESSINGS) ×1
APPLIER CLIP 5 13 M/L LIGAMAX5 (MISCELLANEOUS)
APPLIER CLIP ROT 13.4 12 LRG (CLIP)
APR CLP LRG 13.4X12 ROT 20 MLT (CLIP)
APR CLP MED LRG 5 ANG JAW (MISCELLANEOUS)
BLADE SURG 15 STRL LF DISP TIS (BLADE) ×1 IMPLANT
BLADE SURG 15 STRL SS (BLADE) ×2
CLIP APPLIE 5 13 M/L LIGAMAX5 (MISCELLANEOUS) IMPLANT
CLIP APPLIE ROT 13.4 12 LRG (CLIP) IMPLANT
COVER SURGICAL LIGHT HANDLE (MISCELLANEOUS) ×2 IMPLANT
COVER TIP SHEARS 8 DVNC (MISCELLANEOUS) ×1 IMPLANT
COVER TIP SHEARS 8MM DA VINCI (MISCELLANEOUS) ×2
DECANTER SPIKE VIAL GLASS SM (MISCELLANEOUS) ×2 IMPLANT
DERMABOND ADVANCED (GAUZE/BANDAGES/DRESSINGS) ×1
DERMABOND ADVANCED .7 DNX12 (GAUZE/BANDAGES/DRESSINGS) ×1 IMPLANT
DEVICE TROCAR PUNCTURE CLOSURE (ENDOMECHANICALS) IMPLANT
DRAIN PENROSE 0.5X18 (DRAIN) ×2 IMPLANT
DRAPE ARM DVNC X/XI (DISPOSABLE) ×4 IMPLANT
DRAPE COLUMN DVNC XI (DISPOSABLE) ×1 IMPLANT
DRAPE DA VINCI XI ARM (DISPOSABLE) ×8
DRAPE DA VINCI XI COLUMN (DISPOSABLE) ×2
ELECT REM PT RETURN 15FT ADLT (MISCELLANEOUS) ×2 IMPLANT
GAUZE 4X4 16PLY ~~LOC~~+RFID DBL (SPONGE) ×2 IMPLANT
GLOVE SURG ENC TEXT LTX SZ8 (GLOVE) ×4 IMPLANT
GOWN STRL REUS W/TWL XL LVL3 (GOWN DISPOSABLE) ×8 IMPLANT
GRASPER SUT TROCAR 14GX15 (MISCELLANEOUS) IMPLANT
IRRIG SUCT STRYKERFLOW 2 WTIP (MISCELLANEOUS) ×2
IRRIGATION SUCT STRKRFLW 2 WTP (MISCELLANEOUS) ×1 IMPLANT
KIT BASIN OR (CUSTOM PROCEDURE TRAY) ×2 IMPLANT
KIT TURNOVER KIT A (KITS) ×2 IMPLANT
MARKER SKIN DUAL TIP RULER LAB (MISCELLANEOUS) ×2 IMPLANT
NEEDLE HYPO 22GX1.5 SAFETY (NEEDLE) ×2 IMPLANT
OBTURATOR OPTICAL STANDARD 8MM (TROCAR) ×2
OBTURATOR OPTICAL STND 8 DVNC (TROCAR) ×1
OBTURATOR OPTICALSTD 8 DVNC (TROCAR) ×1 IMPLANT
PACK CARDIOVASCULAR III (CUSTOM PROCEDURE TRAY) ×2 IMPLANT
PAD POSITIONING PINK XL (MISCELLANEOUS) IMPLANT
SCISSORS LAP 5X45 EPIX DISP (ENDOMECHANICALS) IMPLANT
SEAL CANN UNIV 5-8 DVNC XI (MISCELLANEOUS) ×4 IMPLANT
SEAL XI 5MM-8MM UNIVERSAL (MISCELLANEOUS) ×8
SEALER VESSEL DA VINCI XI (MISCELLANEOUS) ×2
SEALER VESSEL EXT DVNC XI (MISCELLANEOUS) ×1 IMPLANT
SOL ANTI FOG 6CC (MISCELLANEOUS) ×1 IMPLANT
SOLUTION ANTI FOG 6CC (MISCELLANEOUS) ×1
SOLUTION ELECTROLUBE (MISCELLANEOUS) ×2 IMPLANT
SUT ETHIBOND 0 36 GRN (SUTURE) ×6 IMPLANT
SUT MNCRL AB 4-0 PS2 18 (SUTURE) ×4 IMPLANT
SUT VIC AB 2-0 SH 27 (SUTURE) ×2
SUT VIC AB 2-0 SH 27X BRD (SUTURE) IMPLANT
SUT VICRYL 0 UR6 27IN ABS (SUTURE) IMPLANT
SYR 10ML ECCENTRIC (SYRINGE) ×2 IMPLANT
SYR 20ML LL LF (SYRINGE) ×2 IMPLANT
TIP INNERVISION DETACH 40FR (MISCELLANEOUS) IMPLANT
TIP INNERVISION DETACH 50FR (MISCELLANEOUS) IMPLANT
TIP INNERVISION DETACH 56FR (MISCELLANEOUS) IMPLANT
TIPS INNERVISION DETACH 40FR (MISCELLANEOUS)
TOWEL OR 17X26 10 PK STRL BLUE (TOWEL DISPOSABLE) ×2 IMPLANT
TRAY FOLEY MTR SLVR 16FR STAT (SET/KITS/TRAYS/PACK) IMPLANT
TROCAR ADV FIXATION 12X100MM (TROCAR) ×2 IMPLANT
TROCAR ADV FIXATION 5X100MM (TROCAR) IMPLANT
TROCAR BLADELESS OPT 5 100 (ENDOMECHANICALS) ×2 IMPLANT
TUBING CONNECTING 10 (TUBING) IMPLANT
TUBING INSUFFLATION 10FT LAP (TUBING) ×2 IMPLANT

## 2020-12-06 NOTE — Progress Notes (Signed)
Patient is aware her surgery is delayed, she has been trying to call her husband to tell him, I informed her I would try and reach husband also after prepping some other patients.  But patient called out and states she finally was able to reach her husband by phone and informed him about her delayed surgery.

## 2020-12-06 NOTE — Anesthesia Preprocedure Evaluation (Addendum)
Anesthesia Evaluation  Patient identified by MRN, date of birth, ID band Patient awake    Reviewed: Allergy & Precautions, NPO status , Patient's Chart, lab work & pertinent test results  History of Anesthesia Complications (+) PONV and history of anesthetic complications  Airway Mallampati: II  TM Distance: <3 FB Neck ROM: Full    Dental no notable dental hx.    Pulmonary neg pulmonary ROS, former smoker,    Pulmonary exam normal        Cardiovascular hypertension, Pt. on medications Normal cardiovascular exam     Neuro/Psych Anxiety Depression Hx of cervical fusion    GI/Hepatic Neg liver ROS, hiatal hernia (s/p Nissen), GERD  Controlled and Medicated,  Endo/Other  negative endocrine ROS  Renal/GU negative Renal ROS  negative genitourinary   Musculoskeletal  (+) Arthritis , Fibromyalgia -  Abdominal   Peds  Hematology negative hematology ROS (+)   Anesthesia Other Findings Day of surgery medications reviewed with patient.  Reproductive/Obstetrics negative OB ROS                            Anesthesia Physical Anesthesia Plan  ASA: 2  Anesthesia Plan: General   Post-op Pain Management:    Induction: Intravenous  PONV Risk Score and Plan: 4 or greater and Treatment may vary due to age or medical condition, Ondansetron, Dexamethasone and Propofol infusion  Airway Management Planned: Oral ETT and Video Laryngoscope Planned  Additional Equipment: None  Intra-op Plan:   Post-operative Plan: Extubation in OR  Informed Consent: I have reviewed the patients History and Physical, chart, labs and discussed the procedure including the risks, benefits and alternatives for the proposed anesthesia with the patient or authorized representative who has indicated his/her understanding and acceptance.     Dental advisory given  Plan Discussed with: CRNA  Anesthesia Plan Comments:         Anesthesia Quick Evaluation

## 2020-12-06 NOTE — Transfer of Care (Signed)
Immediate Anesthesia Transfer of Care Note  Patient: Bonnie Juarez  Procedure(s) Performed: XI ROBOTIC ASSISTED REDO HIATAL HERNIA REPAIR (Abdomen)  Patient Location: PACU  Anesthesia Type:General  Level of Consciousness: awake, alert , oriented and patient cooperative  Airway & Oxygen Therapy: Patient Spontanous Breathing and Patient connected to face mask oxygen  Post-op Assessment: Report given to RN, Post -op Vital signs reviewed and stable and Patient moving all extremities X 4  Post vital signs: stable  Last Vitals:  Vitals Value Taken Time  BP 179/74 12/06/20 1626  Temp 36.6 C 12/06/20 1626  Pulse 63 12/06/20 1630  Resp 12 12/06/20 1630  SpO2 99 % 12/06/20 1630  Vitals shown include unvalidated device data.  Last Pain:  Vitals:   12/06/20 1626  TempSrc:   PainSc: 6          Complications: No notable events documented.

## 2020-12-06 NOTE — Progress Notes (Signed)
Spoke to Terri about IV compazine dose, agreed on 12.5mg  Compazine IV every 6 hours PRN

## 2020-12-06 NOTE — Interval H&P Note (Signed)
History and Physical Interval Note:  12/06/2020 1:37 PM  Bonnie Juarez  has presented today for surgery, with the diagnosis of recurrent hiatal hernia and gerd.  The various methods of treatment have been discussed with the patient and family. After consideration of risks, benefits and other options for treatment, the patient has consented to  Procedure(s): XI ROBOTIC [place] (N/A) as a surgical intervention.  [name] history has been reviewed, patient examined, no change in status, stable for surgery.  I have reviewed the patient's chart and labs.  Questions were answered to the patient's satisfaction.     Pedro Earls

## 2020-12-06 NOTE — Op Note (Signed)
   Patient: Bonnie Juarez (07/15/1945, [id number])  Date of Surgery: 12/06/2020   Preoperative Diagnosis: recurrent hiatal hernia and gerd   Postoperative Diagnosis: Gastroparesis  Surgical Procedure: Upper Endoscopy   Surgeon: Louanna Raw, MD  Anesthesiologist: Brennan Bailey, MD CRNA: Lissa Morales, CRNA   Anesthesia: General   Fluids:  Total I/O In: 1100 [I.V.:1000; IV OTLXBWIOM:355] Out: -   Complications: None  Drains:  None  Specimen: None   Indications for Procedure: [name] is a 75 y.o. female undergoing recurrent hiatal hernia repair with Dr. Hassell Done and an EGD was requested to evaluate foregut anatomy intraoperatively.  Description of Procedure: During the procedure, I scrubbed out and obtained the Olympus endoscope. I gently placed endoscope in the patient's oropharynx and gently glided it down the esophagus without any difficulty under direct visualization.  The scope was advanced as far as the duodenal bulb and then slowly withdrawn to inspect the foregut anatomy.  I was unable to visualize the second portion of the duodenum due to intra-abdominal insufflation and retained food.  There was a significant amount of retained food in the duodenal bulb and throughout the gastric lumen.  The GE junction appeared intra-abdominal without any obstruction.  There was no retained food in the esophagus.  There was tortuosity to the cervical esophagus likely due to positioning and previous neck surgery.  The intraluminal insufflation was decompressed. The scope was withdrawn. The patient tolerated this portion of the procedure well. Please see Dr Earlie Server operative note for details regarding the remainder of the procedure.    Louanna Raw, MD General, Bariatric, & Minimally Invasive Surgery [facility], [place]

## 2020-12-06 NOTE — Progress Notes (Signed)
Patient was called in early due to surgery time change.  However Dr.Martin is now behind on his cases. Informed patient she was his 3rd case and he was still working on the 1st case and she would be delayed now.  Patient voiced understanding.

## 2020-12-06 NOTE — Progress Notes (Signed)
MD Ninfa Linden paged about patient's being nauseous, Verbal order for IV Compazine, pharmacy dosed.

## 2020-12-06 NOTE — Anesthesia Postprocedure Evaluation (Signed)
Anesthesia Post Note  Patient: Bonnie Juarez  Procedure(s) Performed: XI ROBOTIC ASSISTED REDO HIATAL HERNIA REPAIR (Abdomen)     Patient location during evaluation: PACU Anesthesia Type: General Level of consciousness: awake and alert and oriented Pain management: pain level controlled Vital Signs Assessment: post-procedure vital signs reviewed and stable Respiratory status: spontaneous breathing, nonlabored ventilation and respiratory function stable Cardiovascular status: blood pressure returned to baseline Postop Assessment: no apparent nausea or vomiting Anesthetic complications: no   No notable events documented.  Last Vitals:  Vitals:   12/06/20 1715 12/06/20 1730  BP: 135/69 136/67  Pulse: 65 64  Resp: 15 15  Temp:  36.4 C  SpO2: 95% 96%    Last Pain:  Vitals:   12/06/20 1730  TempSrc:   PainSc: Asleep   Pain Goal:                   Marthenia Rolling

## 2020-12-06 NOTE — Anesthesia Procedure Notes (Addendum)
Procedure Name: Intubation Date/Time: 12/06/2020 2:20 PM Performed by: Lissa Morales, CRNA Pre-anesthesia Checklist: Patient identified, Emergency Drugs available, Suction available and Patient being monitored Patient Re-evaluated:Patient Re-evaluated prior to induction Oxygen Delivery Method: Circle system utilized Preoxygenation: Pre-oxygenation with 100% oxygen Induction Type: IV induction Ventilation: Mask ventilation without difficulty Laryngoscope Size: Glidescope and 3 Grade View: Grade II Tube type: Oral Tube size: 7.0 mm Number of attempts: 1 Airway Equipment and Method: Stylet and Oral airway Placement Confirmation: ETT inserted through vocal cords under direct vision, positive ETCO2 and breath sounds checked- equal and bilateral Secured at: 22 cm Tube secured with: Tape Dental Injury: Teeth and Oropharynx as per pre-operative assessment  Comments: Patient stated very sore throat after last surgery and has a neck fusion with limited mobility. Glidesope used with good visualization. Very gentleuse of scope and placement of ETT through cords. atraumatic

## 2020-12-06 NOTE — Op Note (Signed)
Bonnie Juarez  1945/09/06   12/06/2020    PCP:  Cari Caraway, MD   Surgeon: Kaylyn Lim, MD, FACS  Asst:  Elonda Husky, MD, FACS  Anes:  general  Preop Dx: Dysphagia after foregut reconstruction Postop Dx: same  Procedure: Xi robotic foregut dissection with upper endoscopy Location Surgery: [facility] 2 Complications: none  EBL:   minimal cc  Drains: none  Description of Procedure:  The patient was taken to OR 2 .  After anesthesia was administered and the patient was prepped  with chloroprep  and a timeout was performed.  Access was achieved with a 5 mm Optiview through the left upper quadrant.  4 eight mm robotic trocars were placed across the abdomen with a 12 inferiorly for the assist and a 5 mm Nathanson in the upper midline.    The EG junction was dissected and the previous surgery changes were noted.  The EG junction was in the abdomen and was fixed with adhesions.  A serosal tear in the greater curvature was reinforced with a single 2-0 vicryl.    Dr. Lanny Hurst performed endoscopy and the esophagus was free of debris.  The stomach was full of retained food of serveral days.  Food was noted in the bulb of the duodenum.    There was not any further dissection at the hiatus that I felt could improve her symptoms since there was no evidence of obstruction at the EG junction.    The wounds were closed with 4-0 Monocryl and Dermabond.    The patient tolerated the procedure well and was taken to the PACU in stable condition.     Matt B. Hassell Done, [place], [facility] Surgery, [place]

## 2020-12-06 NOTE — Progress Notes (Signed)
Pt noted to have tremor-like shaking when she got to the floor, pt states this has never happened before. Martin notified via secure chat, no new orders.

## 2020-12-07 ENCOUNTER — Encounter (HOSPITAL_COMMUNITY): Payer: Self-pay | Admitting: Surgery

## 2020-12-07 ENCOUNTER — Other Ambulatory Visit (HOSPITAL_COMMUNITY): Payer: Self-pay

## 2020-12-07 ENCOUNTER — Other Ambulatory Visit: Payer: Self-pay

## 2020-12-07 DIAGNOSIS — K449 Diaphragmatic hernia without obstruction or gangrene: Secondary | ICD-10-CM | POA: Diagnosis not present

## 2020-12-07 MED ORDER — HYDROCODONE-ACETAMINOPHEN 5-325 MG PO TABS
1.0000 | ORAL_TABLET | Freq: Four times a day (QID) | ORAL | 0 refills | Status: DC | PRN
  Filled 2020-12-07: qty 15, 4d supply, fill #0

## 2020-12-07 MED ORDER — ONDANSETRON 4 MG PO TBDP
4.0000 mg | ORAL_TABLET | Freq: Three times a day (TID) | ORAL | 0 refills | Status: DC | PRN
  Filled 2020-12-07: qty 20, 7d supply, fill #0

## 2020-12-07 NOTE — Progress Notes (Signed)
Discharge instructions given to patient and all questions were answered.  

## 2020-12-13 DIAGNOSIS — L570 Actinic keratosis: Secondary | ICD-10-CM | POA: Diagnosis not present

## 2020-12-13 DIAGNOSIS — Z86018 Personal history of other benign neoplasm: Secondary | ICD-10-CM | POA: Diagnosis not present

## 2020-12-13 DIAGNOSIS — Z23 Encounter for immunization: Secondary | ICD-10-CM | POA: Diagnosis not present

## 2020-12-13 DIAGNOSIS — D225 Melanocytic nevi of trunk: Secondary | ICD-10-CM | POA: Diagnosis not present

## 2020-12-13 DIAGNOSIS — L814 Other melanin hyperpigmentation: Secondary | ICD-10-CM | POA: Diagnosis not present

## 2020-12-13 DIAGNOSIS — L578 Other skin changes due to chronic exposure to nonionizing radiation: Secondary | ICD-10-CM | POA: Diagnosis not present

## 2020-12-13 DIAGNOSIS — Z85820 Personal history of malignant melanoma of skin: Secondary | ICD-10-CM | POA: Diagnosis not present

## 2020-12-13 DIAGNOSIS — L820 Inflamed seborrheic keratosis: Secondary | ICD-10-CM | POA: Diagnosis not present

## 2020-12-13 DIAGNOSIS — Z85828 Personal history of other malignant neoplasm of skin: Secondary | ICD-10-CM | POA: Diagnosis not present

## 2020-12-13 DIAGNOSIS — L821 Other seborrheic keratosis: Secondary | ICD-10-CM | POA: Diagnosis not present

## 2020-12-16 NOTE — Discharge Summary (Signed)
Physician Discharge Summary  Patient ID: Bonnie Juarez MRN: [id number] DOB/AGE: 09/16/1945 [age] y.o.  PCP: Cari Caraway, MD  Admit date: 12/06/2020 Discharge date: 12/07/2020  Admission Diagnoses:  dysphagia possibly secondary to rehernation  Discharge Diagnoses:  gastroparesis  Active Problems:   Gastroparesis   Surgery:  robotic foregut exploration and upper endoscopy  Discharged Condition: about the same as at admission  Hospital Course:   Patient had surgery and the prior wrap and hiatus were dissected and examined.  No significant pathology.  However, the stomach and duodenum were full of several day old food indicative of gastroparesis.  Patient was stable on PD 1 and was dicharged.  Consults: none  Significant Diagnostic Studies: none    Discharge Exam: Blood pressure 132/78, pulse 63, temperature 98.7 F (37.1 C), resp. rate 16, height 5' 6.25" (1.683 m), weight 73.5 kg, SpO2 97 %. Incisions ok  Disposition: Discharge disposition: 01-Home or Self Care       Discharge Instructions     Call MD for:  redness, tenderness, or signs of infection (pain, swelling, redness, odor or green/yellow discharge around incision site)   Complete by: As directed    Diet full liquid   Complete by: As directed    Discharge instructions   Complete by: As directed    Stay on a full liquid diet for the next 2 weeks I am trying to get you an appointment at [facility] with Dr. Scherrie November for gastroparesis.   Increase activity slowly   Complete by: As directed       Allergies as of 12/07/2020       Reactions   Boniva [ibandronic Acid] Other (See Comments)   Bone Pain   Dilaudid [hydromorphone Hcl] Other (See Comments)   Hallucinations   Risedronate Sodium Other (See Comments)   Bone pain   Statins Other (See Comments)        Medication List     TAKE these medications    acetaminophen 500 MG tablet Commonly known as: TYLENOL Take  500-1,000 mg by mouth every 6 (six) hours as needed for moderate pain.   cholecalciferol 25 MCG (1000 UNIT) tablet Commonly known as: VITAMIN D3 Take 1,000 Units by mouth every other day.   CoQ10 200 MG Caps Take 200 mg by mouth daily.   cromolyn 5.2 MG/ACT nasal spray Commonly known as: NASALCROM Place 1 spray into both nostrils 2 (two) times daily as needed for allergies.   denosumab 60 MG/ML Sosy injection Commonly known as: PROLIA Inject 60 mg into the skin every 6 (six) months.   escitalopram 20 MG tablet Commonly known as: LEXAPRO Take 40 mg by mouth at bedtime.   HYDROcodone-acetaminophen 5-325 MG tablet Commonly known as: NORCO/VICODIN Take 1 tablet by mouth every 4 (four) hours as needed (severe pain). What changed: Another medication with the same name was added. Make sure you understand how and when to take each.   HYDROcodone-acetaminophen 5-325 MG tablet Commonly known as: NORCO/VICODIN Take 1 tablet by mouth every 6 (six) hours as needed for moderate pain. What changed: You were already taking a medication with the same name, and this prescription was added. Make sure you understand how and when to take each.   loratadine 10 MG tablet Commonly known as: CLARITIN Take 10 mg by mouth daily.   olmesartan 20 MG tablet Commonly known as: BENICAR Take 20 mg by mouth daily.   ondansetron 4 MG disintegrating tablet Commonly known as: ZOFRAN-ODT Take 1  tablet (4 mg total) by mouth every 6 (six) hours as needed for nausea. What changed: Another medication with the same name was added. Make sure you understand how and when to take each.   ondansetron 4 MG disintegrating tablet Commonly known as: Zofran ODT Dissolve 1 tablet by mouth every 8 (eight) hours as needed for nausea or vomiting. What changed: You were already taking a medication with the same name, and this prescription was added. Make sure you understand how and when to take each.   pantoprazole 20 MG  tablet Commonly known as: PROTONIX Take 20 mg by mouth in the morning.   pantoprazole 40 MG tablet Commonly known as: PROTONIX Take 40 mg by mouth every evening.   Repatha 140 MG/ML Sosy Generic drug: Evolocumab INJECT 140 MG INTO THE SKIN EVERY 14 (FOURTEEN) DAYS.   Systane 0.4-0.3 % Soln Generic drug: Polyethyl Glycol-Propyl Glycol Place 1-2 drops into both eyes 3 (three) times daily as needed (dry/irritated eyes).   traZODone 100 MG tablet Commonly known as: DESYREL Take 100 mg by mouth at bedtime.   vitamin B-12 1000 MCG tablet Commonly known as: CYANOCOBALAMIN Take 1,000 mcg by mouth every other day.   vitamin C 1000 MG tablet Take 1,000 mg by mouth daily.         Signed: Pedro Earls 12/16/2020, 9:28 PM

## 2021-01-10 ENCOUNTER — Other Ambulatory Visit: Payer: Self-pay | Admitting: *Deleted

## 2021-01-10 ENCOUNTER — Other Ambulatory Visit: Payer: Self-pay | Admitting: Internal Medicine

## 2021-01-10 DIAGNOSIS — E782 Mixed hyperlipidemia: Secondary | ICD-10-CM

## 2021-01-15 DIAGNOSIS — K219 Gastro-esophageal reflux disease without esophagitis: Secondary | ICD-10-CM | POA: Diagnosis not present

## 2021-01-15 DIAGNOSIS — I10 Essential (primary) hypertension: Secondary | ICD-10-CM | POA: Diagnosis not present

## 2021-01-15 DIAGNOSIS — F3342 Major depressive disorder, recurrent, in full remission: Secondary | ICD-10-CM | POA: Diagnosis not present

## 2021-01-15 DIAGNOSIS — E782 Mixed hyperlipidemia: Secondary | ICD-10-CM | POA: Diagnosis not present

## 2021-01-15 DIAGNOSIS — M810 Age-related osteoporosis without current pathological fracture: Secondary | ICD-10-CM | POA: Diagnosis not present

## 2021-01-15 DIAGNOSIS — F341 Dysthymic disorder: Secondary | ICD-10-CM | POA: Diagnosis not present

## 2021-01-15 DIAGNOSIS — F3341 Major depressive disorder, recurrent, in partial remission: Secondary | ICD-10-CM | POA: Diagnosis not present

## 2021-02-07 ENCOUNTER — Other Ambulatory Visit (HOSPITAL_COMMUNITY): Payer: Self-pay

## 2021-03-02 DIAGNOSIS — D0461 Carcinoma in situ of skin of right upper limb, including shoulder: Secondary | ICD-10-CM | POA: Diagnosis not present

## 2021-03-02 DIAGNOSIS — L820 Inflamed seborrheic keratosis: Secondary | ICD-10-CM | POA: Diagnosis not present

## 2021-03-02 DIAGNOSIS — L570 Actinic keratosis: Secondary | ICD-10-CM | POA: Diagnosis not present

## 2021-03-02 DIAGNOSIS — B079 Viral wart, unspecified: Secondary | ICD-10-CM | POA: Diagnosis not present

## 2021-03-02 DIAGNOSIS — D485 Neoplasm of uncertain behavior of skin: Secondary | ICD-10-CM | POA: Diagnosis not present

## 2021-03-02 DIAGNOSIS — C44729 Squamous cell carcinoma of skin of left lower limb, including hip: Secondary | ICD-10-CM | POA: Diagnosis not present

## 2021-03-02 DIAGNOSIS — Z23 Encounter for immunization: Secondary | ICD-10-CM | POA: Diagnosis not present

## 2021-03-08 DIAGNOSIS — E782 Mixed hyperlipidemia: Secondary | ICD-10-CM | POA: Diagnosis not present

## 2021-03-08 LAB — LIPID PANEL
Chol/HDL Ratio: 2.2 ratio (ref 0.0–4.4)
Cholesterol, Total: 175 mg/dL (ref 100–199)
HDL: 78 mg/dL (ref >39)
LDL Chol Calc (NIH): 80 mg/dL (ref 0–99)
Triglycerides: 95 mg/dL (ref 0–149)
VLDL Cholesterol Cal: 17 mg/dL (ref 5–40)

## 2021-03-15 ENCOUNTER — Encounter (HOSPITAL_BASED_OUTPATIENT_CLINIC_OR_DEPARTMENT_OTHER): Payer: Self-pay | Admitting: Internal Medicine

## 2021-03-15 ENCOUNTER — Other Ambulatory Visit: Payer: Self-pay

## 2021-03-15 ENCOUNTER — Ambulatory Visit (HOSPITAL_BASED_OUTPATIENT_CLINIC_OR_DEPARTMENT_OTHER): Payer: PPO | Admitting: Internal Medicine

## 2021-03-15 VITALS — BP 110/64 | HR 65 | Ht 66.0 in | Wt 160.2 lb

## 2021-03-15 DIAGNOSIS — T466X5A Adverse effect of antihyperlipidemic and antiarteriosclerotic drugs, initial encounter: Secondary | ICD-10-CM

## 2021-03-15 DIAGNOSIS — R931 Abnormal findings on diagnostic imaging of heart and coronary circulation: Secondary | ICD-10-CM | POA: Diagnosis not present

## 2021-03-15 DIAGNOSIS — T466X5D Adverse effect of antihyperlipidemic and antiarteriosclerotic drugs, subsequent encounter: Secondary | ICD-10-CM | POA: Diagnosis not present

## 2021-03-15 DIAGNOSIS — E782 Mixed hyperlipidemia: Secondary | ICD-10-CM | POA: Diagnosis not present

## 2021-03-15 DIAGNOSIS — M791 Myalgia, unspecified site: Secondary | ICD-10-CM | POA: Diagnosis not present

## 2021-03-15 DIAGNOSIS — Z8249 Family history of ischemic heart disease and other diseases of the circulatory system: Secondary | ICD-10-CM

## 2021-03-15 NOTE — Progress Notes (Signed)
OFFICE CONSULT NOTE  Chief Complaint:  Follow-up dyslipidemia  Primary Care Physician: Cari Caraway, MD  HPI:  Bonnie Juarez is a 76 y.o. female who is being seen today for the evaluation of dyslipidemia at the request of Cari Caraway, MD. This is a pleasant 76 year old female patient is kindly referred to me for evaluation of dyslipidemia.  She has a strong family history of heart disease mostly in her mother who had 2 stents at age 42 and a stroke in her 50s.  There is also history of heart failure diabetes and possible heart attack in her siblings.  Personal medical problems include fibromyalgia and problems with her cervical spine which is been operated on, dyslipidemia, hypertension and history of melanoma.  She currently takes rosuvastatin 10 mg once weekly which is the most medicine she can tolerate.  In the past she is taken atorvastatin which caused significant myalgias as well as pravastatin which she tolerated however did not have significant improvement in her dyslipidemia.  She says this significantly affects her fibromyalgia.  Based on these numbers are most recent lipid profile in August showed total cholesterol 250, triglycerides 103, HDL 76 and LDL 154.  Although she has no known coronary disease, given her age, hypertension and family history of coronary disease, her goal LDL is likely less than 100.  At this point she is unlikely to reach that with additional therapies other than likely a PCSK9 inhibitor.  She reports being asymptomatic as far as no chest pain or worsening shortness of breath.  03/18/2018  Bonnie Juarez is seen today in follow-up.  She is doing very well.  She had spinal surgery in December.  She was having symptoms of sciatica in both legs which is improved significantly.  She is exercising more regularly and has less generalized pain.  She is tolerating the Repatha without any the side effects that she had previously on statins.  Fortunately she has had marked  reduction in her lipid profile.  Total cholesterol 4 days ago was 153, triglycerides 101, HDL 73 and LDL of 60.  This is a marked improvement in her lipid profile will certainly reduce her risk.  She did have a coronary calcium score in September which showed low calcium score of 80, however there was scattered coronary calcifications and moderate diffuse calcifications of the aortic root and descending aorta.  03/05/2019  Bonnie Juarez is seen today in annual follow-up.  Overall she reports doing really well.  Unfortunately she was not able to get her Repatha reauthorized.  Is not clear why this was denied and recently she tried to pick up more medications but was told that she needed new reauthorization.  Previously however it was reported that she did not need prior authorization.  We will look into this further.  She is also concerned about cost of medications and I advised her to consider the health well foundation.  We will provide her with an additional sample of Repatha today as she took her last dose 2 weeks ago and has no further doses.  Anticipate will take at least a couple weeks to do the paperwork.  Her most recent lipid profile was in October 2020 which showed total cholesterol 181, HDL 86, LDL 76 and triglycerides 107.  03/15/2021  Bonnie Juarez is seen today for follow-up.  Overall she is doing well.  Her cholesterol has come down even further with total now 175, triglycerides 95, HDL 78 and LDL 80.  She is also lost  weight recently somewhat unintentionally as she has had issues with gastroparesis and had 1 or 2 other abdominal surgeries over the past year.  She reports compliance with Repatha and denies any side effects with that.  PMHx:  Past Medical History:  Diagnosis Date   Anxiety    Arthritis    Cancer ([place])    Hx: of squamouscell carcinoma on Left shin basal cell on face; melanoma on right lower leg   Complication of anesthesia    2003-11-28 deceased LOC and hypoxia overmedicating with  Morphine and Phenergan; received Narcan X 2, overnight pulse ox   Depression    Fibromyalgia    Frequent urination    GERD (gastroesophageal reflux disease)    Hx: of   H/O hiatal hernia    PMH: of   Headache(784.0)    Hypertension    IBS (irritable bowel syndrome)    Hx: of   Melanoma ([place])    Right leg   Osteopenia    Hx: of per pt, pt. adds osteoporosis- DOS   Pneumonia    hx   PONV (postoperative nausea and vomiting)     Past Surgical History:  Procedure Laterality Date   APPENDECTOMY     ARCUATE KERATECTOMY     BREAST LUMPECTOMY Right [place]   FRACTURE SURGERY Right 2014   humerus   HERNIA REPAIR     hiatal   KNEE ARTHROSCOPY Right 2015   NICEN FUNDO PLACATION  4854   NISSEN FUNDOPLICATION N/A 08/23/348   Procedure: EXPLORATION OF FORGUT WITH ENDOSCOPY AND HITAL HERNIA CLOSURE, UPPER ENDOSCOPY;  Surgeon: Johnathan Hausen, MD;  Location: [facility];  Service: General;  Laterality: N/A;   ORIF HUMERUS FRACTURE Right 01/08/2013   Procedure: OPEN REDUCTION INTERNAL FIXATION (ORIF) PROXIMAL HUMERUS FRACTURE;  Surgeon: Marybelle Killings, MD;  Location: [place];  Service: Orthopedics;  Laterality: Right;  Open Reduction Internal Fixation Right Proximal Humerus Fracture   POSTERIOR CERVICAL FUSION/FORAMINOTOMY N/A 12/20/2016   Procedure: Posterior Cervical Fusion with lateral mass fixation - Cervical Three-Thoracic Two;  Surgeon: Eustace Moore, MD;  Location: [place];  Service: Neurosurgery;  Laterality: N/A;   RADIAL HEAD REPLACEMENT  2005   trigger thrumb release Left 2001   tubular plasity  1970   XI ROBOTIC ASSISTED HIATAL HERNIA REPAIR N/A 12/06/2020   Procedure: XI ROBOTIC ASSISTED REDO HIATAL HERNIA REPAIR;  Surgeon: Johnathan Hausen, MD;  Location: [facility];  Service: General;  Laterality: N/A;    FAMHx:  Family History  Problem Relation Age of Onset   Heart disease Other    Lung disease Other    Breast cancer Neg Hx     SOCHx:   reports that she quit  smoking about 40 years ago. Her smoking use included cigarettes. She has a 15.00 pack-year smoking history. She has never used smokeless tobacco. She reports current alcohol use of about 7.0 - 10.0 standard drinks per week. She reports that she does not use drugs.  ALLERGIES:  Allergies  Allergen Reactions   Boniva [Ibandronic Acid] Other (See Comments)    Bone Pain   Dilaudid [Hydromorphone Hcl] Other (See Comments)    Hallucinations   Risedronate Sodium Other (See Comments)    Bone pain   Statins Other (See Comments)    ROS: Pertinent items noted in HPI and remainder of comprehensive ROS otherwise negative.  HOME MEDS: Current Outpatient Medications on File Prior to Visit  Medication Sig Dispense Refill   acetaminophen (  TYLENOL) 500 MG tablet Take 500-1,000 mg by mouth every 6 (six) hours as needed for moderate pain.     Ascorbic Acid (VITAMIN C) 1000 MG tablet Take 1,000 mg by mouth daily.     cholecalciferol (VITAMIN D3) 25 MCG (1000 UNIT) tablet Take 1,000 Units by mouth every other day.     Coenzyme Q10 (COQ10) 200 MG CAPS Take 200 mg by mouth daily.     cromolyn (NASALCROM) 5.2 MG/ACT nasal spray Place 1 spray into both nostrils 2 (two) times daily as needed for allergies.     denosumab (PROLIA) 60 MG/ML SOSY injection Inject 60 mg into the skin every 6 (six) months.     escitalopram (LEXAPRO) 20 MG tablet Take 40 mg by mouth at bedtime.  2   Evolocumab (REPATHA) 140 MG/ML SOSY INJECT 140 MG INTO THE SKIN EVERY 14 (FOURTEEN) DAYS. 2 mL 11   loratadine (CLARITIN) 10 MG tablet Take 10 mg by mouth daily.     olmesartan (BENICAR) 20 MG tablet Take 20 mg by mouth daily.     pantoprazole (PROTONIX) 20 MG tablet Take 20 mg by mouth in the morning.     pantoprazole (PROTONIX) 40 MG tablet Take 40 mg by mouth every evening.     Polyethyl Glycol-Propyl Glycol (SYSTANE) 0.4-0.3 % SOLN Place 1-2 drops into both eyes 3 (three) times daily as needed (dry/irritated eyes).     traZODone  (DESYREL) 100 MG tablet Take 100 mg by mouth at bedtime.   2   vitamin B-12 (CYANOCOBALAMIN) 1000 MCG tablet Take 1,000 mcg by mouth every other day.     No current facility-administered medications on file prior to visit.    LABS/IMAGING: No results found for this or any previous visit (from the past 48 hour(s)). No results found.  LIPID PANEL:    Component Value Date/Time   CHOL 175 03/08/2021 1103   TRIG 95 03/08/2021 1103   HDL 78 03/08/2021 1103   CHOLHDL 2.2 03/08/2021 1103   LDLCALC 80 03/08/2021 1103    WEIGHTS: Wt Readings from Last 3 Encounters:  03/15/21 160 lb 3.2 oz (72.7 kg)  12/06/20 162 lb 0.6 oz (73.5 kg)  11/15/20 162 lb (73.5 kg)    VITALS: BP 110/64    Pulse 65    Ht 5\' 6"  (1.676 m)    Wt 160 lb 3.2 oz (72.7 kg)    SpO2 97%    BMI 25.86 kg/m   EXAM: Deferred  EKG: Deferred  ASSESSMENT: Mixed dyslipidemia  Mild coronary artery calcification-CAC 80 (10/2017) Strong family history of coronary disease Hypertension Statin intolerance-on Repatha  PLAN: 1.   Ms. Tow has had good cholesterol control and recently weight loss which is beneficial although was not desired due to issues with gastroparesis.  She should continue on Repatha as she tolerates that well.  She denies any anginal symptoms.  Cholesterol appears to be fairly well controlled and I suspect may get even lower with additional weight loss.  Follow-up with me annually or sooner as necessary.  Pixie Casino, MD, [facility], [place] Director of the Advanced Lipid Disorders &  Cardiovascular Risk Reduction Clinic Diplomate of the American Board of Clinical Lipidology Attending Cardiologist  Direct Dial: [phone]   Fax: [phone]  Website:  [email or web address] 03/15/2021, 1:04 PM

## 2021-03-15 NOTE — Patient Instructions (Signed)
Medication Instructions:  Your physician recommends that you continue on your current medications as directed. Please refer to the Current Medication list given to you today.  *If you need a refill on your cardiac medications before your next appointment, please call your pharmacy*   Lab Work: FASTING lab work to check cholesterol in 1 year -- complete before next appointment   If you have labs (blood work) drawn today and your tests are completely normal, you will receive your results only by: [place] (if you have MyChart) OR A paper copy in the mail If you have any lab test that is abnormal or we need to change your treatment, we will call you to review the results.   Testing/Procedures: NONE   Follow-Up: At [facility], you and your health needs are our priority.  As part of our continuing mission to provide you with exceptional heart care, we have created designated Provider Care Teams.  These Care Teams include your primary Cardiologist (physician) and Advanced Practice Providers (APPs -  Physician Assistants and Nurse Practitioners) who all work together to provide you with the care you need, when you need it.  We recommend signing up for the patient portal called "MyChart".  Sign up information is provided on this After Visit Summary.  MyChart is used to connect with patients for Virtual Visits (Telemedicine).  Patients are able to view lab/test results, encounter notes, upcoming appointments, etc.  Non-urgent messages can be sent to your provider as well.   To learn more about what you can do with MyChart, go to [email or web address].    Your next appointment:   1 year with Dr. Debara Pickett -- lipid clinic

## 2021-03-17 DIAGNOSIS — L570 Actinic keratosis: Secondary | ICD-10-CM | POA: Diagnosis not present

## 2021-03-17 DIAGNOSIS — C44729 Squamous cell carcinoma of skin of left lower limb, including hip: Secondary | ICD-10-CM | POA: Diagnosis not present

## 2021-03-31 DIAGNOSIS — L570 Actinic keratosis: Secondary | ICD-10-CM | POA: Diagnosis not present

## 2021-03-31 DIAGNOSIS — Z4802 Encounter for removal of sutures: Secondary | ICD-10-CM | POA: Diagnosis not present

## 2021-04-13 DIAGNOSIS — R1319 Other dysphagia: Secondary | ICD-10-CM | POA: Diagnosis not present

## 2021-04-13 DIAGNOSIS — K2100 Gastro-esophageal reflux disease with esophagitis, without bleeding: Secondary | ICD-10-CM | POA: Diagnosis not present

## 2021-04-13 DIAGNOSIS — K3184 Gastroparesis: Secondary | ICD-10-CM | POA: Diagnosis not present

## 2021-04-13 DIAGNOSIS — R112 Nausea with vomiting, unspecified: Secondary | ICD-10-CM | POA: Diagnosis not present

## 2021-04-13 DIAGNOSIS — Z79899 Other long term (current) drug therapy: Secondary | ICD-10-CM | POA: Diagnosis not present

## 2021-04-13 DIAGNOSIS — K219 Gastro-esophageal reflux disease without esophagitis: Secondary | ICD-10-CM | POA: Diagnosis not present

## 2021-04-27 DIAGNOSIS — R109 Unspecified abdominal pain: Secondary | ICD-10-CM | POA: Diagnosis not present

## 2021-04-27 DIAGNOSIS — R12 Heartburn: Secondary | ICD-10-CM | POA: Diagnosis not present

## 2021-04-27 DIAGNOSIS — R110 Nausea: Secondary | ICD-10-CM | POA: Diagnosis not present

## 2021-04-27 DIAGNOSIS — R140 Abdominal distension (gaseous): Secondary | ICD-10-CM | POA: Diagnosis not present

## 2021-04-27 DIAGNOSIS — R634 Abnormal weight loss: Secondary | ICD-10-CM | POA: Diagnosis not present

## 2021-04-27 DIAGNOSIS — K3189 Other diseases of stomach and duodenum: Secondary | ICD-10-CM | POA: Diagnosis not present

## 2021-05-06 DIAGNOSIS — M797 Fibromyalgia: Secondary | ICD-10-CM | POA: Diagnosis not present

## 2021-05-06 DIAGNOSIS — M810 Age-related osteoporosis without current pathological fracture: Secondary | ICD-10-CM | POA: Diagnosis not present

## 2021-05-06 DIAGNOSIS — E782 Mixed hyperlipidemia: Secondary | ICD-10-CM | POA: Diagnosis not present

## 2021-05-06 DIAGNOSIS — Z23 Encounter for immunization: Secondary | ICD-10-CM | POA: Diagnosis not present

## 2021-05-06 DIAGNOSIS — F419 Anxiety disorder, unspecified: Secondary | ICD-10-CM | POA: Diagnosis not present

## 2021-05-06 DIAGNOSIS — I10 Essential (primary) hypertension: Secondary | ICD-10-CM | POA: Diagnosis not present

## 2021-05-06 DIAGNOSIS — F3341 Major depressive disorder, recurrent, in partial remission: Secondary | ICD-10-CM | POA: Diagnosis not present

## 2021-05-06 DIAGNOSIS — K219 Gastro-esophageal reflux disease without esophagitis: Secondary | ICD-10-CM | POA: Diagnosis not present

## 2021-05-06 DIAGNOSIS — J309 Allergic rhinitis, unspecified: Secondary | ICD-10-CM | POA: Diagnosis not present

## 2021-05-06 DIAGNOSIS — G479 Sleep disorder, unspecified: Secondary | ICD-10-CM | POA: Diagnosis not present

## 2021-05-09 DIAGNOSIS — K3184 Gastroparesis: Secondary | ICD-10-CM | POA: Diagnosis not present

## 2021-05-09 DIAGNOSIS — R112 Nausea with vomiting, unspecified: Secondary | ICD-10-CM | POA: Diagnosis not present

## 2021-05-09 DIAGNOSIS — R7309 Other abnormal glucose: Secondary | ICD-10-CM | POA: Diagnosis not present

## 2021-05-19 DIAGNOSIS — R1319 Other dysphagia: Secondary | ICD-10-CM | POA: Diagnosis not present

## 2021-05-19 DIAGNOSIS — R1310 Dysphagia, unspecified: Secondary | ICD-10-CM | POA: Diagnosis not present

## 2021-05-19 DIAGNOSIS — K3184 Gastroparesis: Secondary | ICD-10-CM | POA: Diagnosis not present

## 2021-05-19 DIAGNOSIS — Z9889 Other specified postprocedural states: Secondary | ICD-10-CM | POA: Diagnosis not present

## 2021-05-19 DIAGNOSIS — R112 Nausea with vomiting, unspecified: Secondary | ICD-10-CM | POA: Diagnosis not present

## 2021-06-10 DIAGNOSIS — M810 Age-related osteoporosis without current pathological fracture: Secondary | ICD-10-CM | POA: Diagnosis not present

## 2021-07-07 DIAGNOSIS — H04123 Dry eye syndrome of bilateral lacrimal glands: Secondary | ICD-10-CM | POA: Diagnosis not present

## 2021-07-07 DIAGNOSIS — H5213 Myopia, bilateral: Secondary | ICD-10-CM | POA: Diagnosis not present

## 2021-07-07 DIAGNOSIS — Z961 Presence of intraocular lens: Secondary | ICD-10-CM | POA: Diagnosis not present

## 2021-07-07 DIAGNOSIS — H18593 Other hereditary corneal dystrophies, bilateral: Secondary | ICD-10-CM | POA: Diagnosis not present

## 2021-07-26 DIAGNOSIS — F411 Generalized anxiety disorder: Secondary | ICD-10-CM | POA: Diagnosis not present

## 2021-07-26 DIAGNOSIS — F4310 Post-traumatic stress disorder, unspecified: Secondary | ICD-10-CM | POA: Diagnosis not present

## 2021-08-01 DIAGNOSIS — I10 Essential (primary) hypertension: Secondary | ICD-10-CM | POA: Diagnosis not present

## 2021-08-01 DIAGNOSIS — F3341 Major depressive disorder, recurrent, in partial remission: Secondary | ICD-10-CM | POA: Diagnosis not present

## 2021-08-01 DIAGNOSIS — M810 Age-related osteoporosis without current pathological fracture: Secondary | ICD-10-CM | POA: Diagnosis not present

## 2021-08-01 DIAGNOSIS — K219 Gastro-esophageal reflux disease without esophagitis: Secondary | ICD-10-CM | POA: Diagnosis not present

## 2021-08-01 DIAGNOSIS — E782 Mixed hyperlipidemia: Secondary | ICD-10-CM | POA: Diagnosis not present

## 2021-08-04 ENCOUNTER — Ambulatory Visit: Payer: Self-pay

## 2021-08-04 ENCOUNTER — Encounter: Payer: Self-pay | Admitting: Surgery

## 2021-08-04 ENCOUNTER — Ambulatory Visit: Payer: PPO | Admitting: Surgery

## 2021-08-04 VITALS — BP 111/65 | HR 71 | Ht 66.0 in | Wt 160.2 lb

## 2021-08-04 DIAGNOSIS — M170 Bilateral primary osteoarthritis of knee: Secondary | ICD-10-CM

## 2021-08-04 MED ORDER — LIDOCAINE HCL 1 % IJ SOLN
3.0000 mL | INTRAMUSCULAR | Status: AC | PRN
  Administered 2021-08-04: 3 mL

## 2021-08-04 MED ORDER — BUPIVACAINE HCL 0.25 % IJ SOLN
6.0000 mL | INTRAMUSCULAR | Status: AC | PRN
  Administered 2021-08-04: 6 mL via INTRA_ARTICULAR

## 2021-08-04 MED ORDER — METHYLPREDNISOLONE ACETATE 40 MG/ML IJ SUSP
40.0000 mg | INTRAMUSCULAR | Status: AC | PRN
  Administered 2021-08-04: 40 mg via INTRA_ARTICULAR

## 2021-08-04 NOTE — Progress Notes (Signed)
Office Visit Note   Patient: Bonnie Juarez           Date of Birth: 13-Feb-1946           MRN: [id number] Visit Date: 08/04/2021              Requested by: Cari Caraway, [place],  [place] [address] PCP: Cari Caraway, MD   Assessment & Plan: Visit Diagnoses:  1. Bilateral primary osteoarthritis of knee     Plan: Since patient had good relief with previous bilateral knee injections a couple years ago I elected to repeat these today.  The patient consent bodies were prepped with Betadine and intra-articular Marcaine/Depo-Medrol injections were performed.  She will follow-up in 6 weeks for recheck.  We did discuss possibly trying viscosupplementation depending on her response with today's injections.  Follow-Up Instructions: Return in about 6 weeks (around 09/15/2021) for With Dr. Lorin Mercy.   Orders:  Orders Placed This Encounter  Procedures   Large Joint Inj   XR KNEE 3 VIEW LEFT   XR KNEE 3 VIEW RIGHT   No orders of the defined types were placed in this encounter.     Procedures: Large Joint Inj: bilateral knee on 08/04/2021 4:58 PM Indications: pain Details: 25 G 1.5 in needle, anteromedial approach Medications (Right): 3 mL lidocaine 1 %; 6 mL bupivacaine 0.25 %; 40 mg methylPREDNISolone acetate 40 MG/ML Medications (Left): 3 mL lidocaine 1 %; 6 mL bupivacaine 0.25 %; 40 mg methylPREDNISolone acetate 40 MG/ML Outcome: tolerated well, no immediate complications Consent was given by the patient. Patient was prepped and draped in the usual sterile fashion.       Clinical Data: No additional findings.   Subjective: Chief Complaint  Patient presents with   Right Knee - Pain   Left Knee - Pain    HPI 76 year old white female history of DJD bilateral knees returns with complaints of pain and requesting bilateral knee injections.  Patient's had previous bilateral knee injections performed by Dr. Lorin Mercy March 2021 and by me September 2020.  She has had  increased pain over the last couple months aggravated with activity.  No injury. Review of Systems No current cardiopulmonary GI/GU issues  Objective: Vital Signs: BP 111/65   Pulse 71   Ht '5\' 6"'$  (1.676 m)   Wt 160 lb 3.2 oz (72.7 kg)   BMI 25.86 kg/m   Physical Exam HENT:     Head: Normocephalic and atraumatic.     Nose: Nose normal.  Eyes:     Extraocular Movements: Extraocular movements intact.  Pulmonary:     Effort: No respiratory distress.  Musculoskeletal:     Comments: Gait is somewhat antalgic.  Bilateral knees good range of motion.  Positive bilateral patellofemoral crepitus.  Bilateral medial and lateral joint line tenderness.  Some swelling without palpable effusions.  Psychiatric:        Mood and Affect: Mood normal.     Ortho Exam  Specialty Comments:  No specialty comments available.  Imaging: No results found.   PMFS History: Patient Active Problem List   Diagnosis Date Noted   Gastroparesis 12/06/2020   History of repair of hiatal hernia 04/13/2020   Plantar fasciitis of left foot 10/02/2018   S/P lumbar fusion 02/06/2018   Mixed dyslipidemia 11/13/2017   Family history of heart disease 11/13/2017   Statin intolerance 11/13/2017   Bilateral primary osteoarthritis of knee 10/30/2017   S/P cervical spinal fusion 12/20/2016   Spondylosis  without myelopathy or radiculopathy, lumbar region 10/05/2016   Chronic bilateral low back pain without sciatica 10/05/2016   Fibromyalgia 10/05/2016   Closed fracture of right proximal humerus 01/08/2013    Class: Acute   Past Medical History:  Diagnosis Date   Anxiety    Arthritis    Cancer ([place])    Hx: of squamouscell carcinoma on Left shin basal cell on face; melanoma on right lower leg   Complication of anesthesia    11/15/2003 deceased LOC and hypoxia overmedicating with Morphine and Phenergan; received Narcan X 2, overnight pulse ox   Depression    Fibromyalgia    Frequent urination    GERD  (gastroesophageal reflux disease)    Hx: of   H/O hiatal hernia    PMH: of   Headache(784.0)    Hypertension    IBS (irritable bowel syndrome)    Hx: of   Melanoma ([place])    Right leg   Osteopenia    Hx: of per pt, pt. adds osteoporosis- DOS   Pneumonia    hx   PONV (postoperative nausea and vomiting)     Family History  Problem Relation Age of Onset   Heart disease Other    Lung disease Other    Breast cancer Neg Hx     Past Surgical History:  Procedure Laterality Date   APPENDECTOMY     ARCUATE KERATECTOMY     BREAST LUMPECTOMY Right [place]   FRACTURE SURGERY Right 2014   humerus   HERNIA REPAIR     hiatal   KNEE ARTHROSCOPY Right 2015   NICEN FUNDO PLACATION  4562   NISSEN FUNDOPLICATION N/A 5/63/8937   Procedure: EXPLORATION OF FORGUT WITH ENDOSCOPY AND HITAL HERNIA CLOSURE, UPPER ENDOSCOPY;  Surgeon: Johnathan Hausen, MD;  Location: [facility];  Service: General;  Laterality: N/A;   ORIF HUMERUS FRACTURE Right 01/08/2013   Procedure: OPEN REDUCTION INTERNAL FIXATION (ORIF) PROXIMAL HUMERUS FRACTURE;  Surgeon: Marybelle Killings, MD;  Location: [facility];  Service: Orthopedics;  Laterality: Right;  Open Reduction Internal Fixation Right Proximal Humerus Fracture   POSTERIOR CERVICAL FUSION/FORAMINOTOMY N/A 12/20/2016   Procedure: Posterior Cervical Fusion with lateral mass fixation - Cervical Three-Thoracic Two;  Surgeon: Eustace Moore, MD;  Location: [facility];  Service: Neurosurgery;  Laterality: N/A;   RADIAL HEAD REPLACEMENT  2005   trigger thrumb release Left 2001   tubular plasity  1970   XI ROBOTIC ASSISTED HIATAL HERNIA REPAIR N/A 12/06/2020   Procedure: XI ROBOTIC ASSISTED REDO HIATAL HERNIA REPAIR;  Surgeon: Johnathan Hausen, MD;  Location: [facility];  Service: General;  Laterality: N/A;   Social History   Occupational History   Not on file  Tobacco Use   Smoking status: Former    Packs/day: 1.00    Years: 15.00    Total pack years: 15.00    Types:  Cigarettes    Quit date: 12/12/1980    Years since quitting: 40.6   Smokeless tobacco: Never   Tobacco comments:    Quit 1982"  Vaping Use   Vaping Use: Never used  Substance and Sexual Activity   Alcohol use: Yes    Alcohol/week: 7.0 - 10.0 standard drinks of alcohol    Types: 7 - 10 Glasses of wine per week    Comment: occ glass of owine   Drug use: No   Sexual activity: Not on file

## 2021-08-05 ENCOUNTER — Telehealth: Payer: Self-pay

## 2021-08-05 NOTE — Telephone Encounter (Signed)
VOB submitted for Monovisc, bilateral knee. BV pending 

## 2021-08-09 ENCOUNTER — Other Ambulatory Visit: Payer: Self-pay

## 2021-08-09 DIAGNOSIS — M170 Bilateral primary osteoarthritis of knee: Secondary | ICD-10-CM

## 2021-08-11 DIAGNOSIS — R112 Nausea with vomiting, unspecified: Secondary | ICD-10-CM | POA: Diagnosis not present

## 2021-08-11 DIAGNOSIS — K2100 Gastro-esophageal reflux disease with esophagitis, without bleeding: Secondary | ICD-10-CM | POA: Diagnosis not present

## 2021-08-11 DIAGNOSIS — K3184 Gastroparesis: Secondary | ICD-10-CM | POA: Diagnosis not present

## 2021-08-18 DIAGNOSIS — F411 Generalized anxiety disorder: Secondary | ICD-10-CM | POA: Diagnosis not present

## 2021-08-18 DIAGNOSIS — F4310 Post-traumatic stress disorder, unspecified: Secondary | ICD-10-CM | POA: Diagnosis not present

## 2021-08-29 DIAGNOSIS — R051 Acute cough: Secondary | ICD-10-CM | POA: Diagnosis not present

## 2021-08-29 DIAGNOSIS — Z03818 Encounter for observation for suspected exposure to other biological agents ruled out: Secondary | ICD-10-CM | POA: Diagnosis not present

## 2021-09-14 ENCOUNTER — Ambulatory Visit: Payer: PPO | Admitting: Orthopaedic Surgery

## 2021-09-14 DIAGNOSIS — M170 Bilateral primary osteoarthritis of knee: Secondary | ICD-10-CM | POA: Diagnosis not present

## 2021-09-14 MED ORDER — LIDOCAINE HCL 1 % IJ SOLN
0.5000 mL | INTRAMUSCULAR | Status: AC | PRN
  Administered 2021-09-14: .5 mL

## 2021-09-14 MED ORDER — HYALURONAN 88 MG/4ML IX SOSY
88.0000 mg | PREFILLED_SYRINGE | INTRA_ARTICULAR | Status: AC | PRN
  Administered 2021-09-14: 88 mg via INTRA_ARTICULAR

## 2021-09-14 NOTE — Progress Notes (Signed)
Office Visit Note   Patient: Bonnie Juarez           Date of Birth: 1945-07-20           MRN: [id number] Visit Date: 09/14/2021              Requested by: Cari Caraway, [place],  [place] [address] PCP: Cari Caraway, MD   Assessment & Plan: Visit Diagnoses:  1. Bilateral primary osteoarthritis of knee     Plan: Return 1 week for second Monovisc injection right and left knee.  Follow-Up Instructions: No follow-ups on file.   Orders:  Orders Placed This Encounter  Procedures   Large Joint Inj   Large Joint Inj   No orders of the defined types were placed in this encounter.     Procedures: Large Joint Inj: R knee on 09/14/2021 11:53 AM Indications: pain and joint swelling Details: 22 G 1.5 in needle, anterolateral approach  Arthrogram: No  Medications: 0.5 mL lidocaine 1 %; 88 mg Hyaluronan 88 MG/4ML Outcome: tolerated well, no immediate complications Procedure, treatment alternatives, risks and benefits explained, specific risks discussed. Consent was given by the patient. Immediately prior to procedure a time out was called to verify the correct patient, procedure, equipment, support staff and site/side marked as required. Patient was prepped and draped in the usual sterile fashion.    Large Joint Inj: L knee on 09/14/2021 11:54 AM Indications: joint swelling and pain Details: 22 G 1.5 in needle, anterolateral approach  Arthrogram: No  Medications: 0.5 mL lidocaine 1 %; 88 mg Hyaluronan 88 MG/4ML Outcome: tolerated well, no immediate complications Procedure, treatment alternatives, risks and benefits explained, specific risks discussed. Consent was given by the patient. Immediately prior to procedure a time out was called to verify the correct patient, procedure, equipment, support staff and site/side marked as required. Patient was prepped and draped in the usual sterile fashion.       Clinical Data: No additional  findings.   Subjective: Chief Complaint  Patient presents with   Right Knee - Pain    Monovisc injection   Left Knee - Pain    Monovisc injection     HPI patient here for right and left Monovisc injection first of 3.  Review of Systems unchanged.  Of note was problems gastroparesis 40 pounds weight loss GI problems now doing much better.   Objective: Vital Signs: There were no vitals taken for this visit.  Physical Exam unchanged other than weight loss.  Ortho Exam no reaction from previous cortisone injection sites.  Specialty Comments:  No specialty comments available.  Imaging: No results found.   PMFS History: Patient Active Problem List   Diagnosis Date Noted   Gastroparesis 12/06/2020   History of repair of hiatal hernia 04/13/2020   Plantar fasciitis of left foot 10/02/2018   S/P lumbar fusion 02/06/2018   Mixed dyslipidemia 11/13/2017   Family history of heart disease 11/13/2017   Statin intolerance 11/13/2017   Bilateral primary osteoarthritis of knee 10/30/2017   S/P cervical spinal fusion 12/20/2016   Spondylosis without myelopathy or radiculopathy, lumbar region 10/05/2016   Chronic bilateral low back pain without sciatica 10/05/2016   Fibromyalgia 10/05/2016   Closed fracture of right proximal humerus 01/08/2013    Class: Acute   Past Medical History:  Diagnosis Date   Anxiety    Arthritis    Cancer ([place])    Hx: of squamouscell carcinoma on Left shin basal cell on face;  melanoma on right lower leg   Complication of anesthesia    11-19-2003 deceased LOC and hypoxia overmedicating with Morphine and Phenergan; received Narcan X 2, overnight pulse ox   Depression    Fibromyalgia    Frequent urination    GERD (gastroesophageal reflux disease)    Hx: of   H/O hiatal hernia    PMH: of   Headache(784.0)    Hypertension    IBS (irritable bowel syndrome)    Hx: of   Melanoma ([place])    Right leg   Osteopenia    Hx: of per pt, pt. adds osteoporosis-  DOS   Pneumonia    hx   PONV (postoperative nausea and vomiting)     Family History  Problem Relation Age of Onset   Heart disease Other    Lung disease Other    Breast cancer Neg Hx     Past Surgical History:  Procedure Laterality Date   APPENDECTOMY     ARCUATE KERATECTOMY     BREAST LUMPECTOMY Right [address]   FRACTURE SURGERY Right 2014   humerus   HERNIA REPAIR     hiatal   KNEE ARTHROSCOPY Right 2015   NICEN FUNDO PLACATION  1594   NISSEN FUNDOPLICATION N/A 5/85/9292   Procedure: EXPLORATION OF FORGUT WITH ENDOSCOPY AND HITAL HERNIA CLOSURE, UPPER ENDOSCOPY;  Surgeon: Johnathan Hausen, MD;  Location[facility];  Service: General;  Laterality: N/A;   ORIF HUMERUS FRACTURE Right 01/08/2013   Procedure: OPEN REDUCTION INTERNAL FIXATION (ORIF) PROXIMAL HUMERUS FRACTURE;  Surgeon: Marybelle Killings, MD;  Location: [facility];  Service: Orthopedics;  Laterality: Right;  Open Reduction Internal Fixation Right Proximal Humerus Fracture   POSTERIOR CERVICAL FUSION/FORAMINOTOMY N/A 12/20/2016   Procedure: Posterior Cervical Fusion with lateral mass fixation - Cervical Three-Thoracic Two;  Surgeon: Eustace Moore, MD;  Location: [facility];  Service: Neurosurgery;  Laterality: N/A;   RADIAL HEAD REPLACEMENT  2005   trigger thrumb release Left 2001   tubular plasity  1970   XI ROBOTIC ASSISTED HIATAL HERNIA REPAIR N/A 12/06/2020   Procedure: XI ROBOTIC ASSISTED REDO HIATAL HERNIA REPAIR;  Surgeon: Johnathan Hausen, MD;  [facility];  Service: General;  Laterality: N/A;   Social History   Occupational History   Not on file  Tobacco Use   Smoking status: Former    Packs/day: 1.00    Years: 15.00    Total pack years: 15.00    Types: Cigarettes    Quit date: 12/12/1980    Years since quitting: 40.7   Smokeless tobacco: Never   Tobacco comments:    Quit 1982"  Vaping Use   Vaping Use: Never used  Substance and Sexual Activity   Alcohol use: Yes    Alcohol/week: 7.0 -  10.0 standard drinks of alcohol    Types: 7 - 10 Glasses of wine per week    Comment: occ glass of owine   Drug use: No   Sexual activity: Not on file

## 2021-09-21 ENCOUNTER — Ambulatory Visit: Payer: PPO | Admitting: Orthopaedic Surgery

## 2021-10-12 ENCOUNTER — Other Ambulatory Visit: Payer: Self-pay | Admitting: Pharmacist

## 2021-10-12 DIAGNOSIS — E782 Mixed hyperlipidemia: Secondary | ICD-10-CM

## 2021-10-12 DIAGNOSIS — R931 Abnormal findings on diagnostic imaging of heart and coronary circulation: Secondary | ICD-10-CM

## 2021-10-12 DIAGNOSIS — Z789 Other specified health status: Secondary | ICD-10-CM

## 2021-10-12 MED ORDER — REPATHA 140 MG/ML ~~LOC~~ SOSY: 140.0000 mg | PREFILLED_SYRINGE | SUBCUTANEOUS | 1 refills | Status: DC

## 2021-10-20 ENCOUNTER — Other Ambulatory Visit (HOSPITAL_BASED_OUTPATIENT_CLINIC_OR_DEPARTMENT_OTHER): Payer: Self-pay

## 2021-10-27 DIAGNOSIS — F4310 Post-traumatic stress disorder, unspecified: Secondary | ICD-10-CM | POA: Diagnosis not present

## 2021-10-27 DIAGNOSIS — F411 Generalized anxiety disorder: Secondary | ICD-10-CM | POA: Diagnosis not present

## 2021-11-08 ENCOUNTER — Other Ambulatory Visit: Payer: Self-pay | Admitting: Family Medicine

## 2021-11-08 DIAGNOSIS — Z1389 Encounter for screening for other disorder: Secondary | ICD-10-CM | POA: Diagnosis not present

## 2021-11-08 DIAGNOSIS — E782 Mixed hyperlipidemia: Secondary | ICD-10-CM | POA: Diagnosis not present

## 2021-11-08 DIAGNOSIS — M810 Age-related osteoporosis without current pathological fracture: Secondary | ICD-10-CM | POA: Diagnosis not present

## 2021-11-08 DIAGNOSIS — I10 Essential (primary) hypertension: Secondary | ICD-10-CM | POA: Diagnosis not present

## 2021-11-08 DIAGNOSIS — M8580 Other specified disorders of bone density and structure, unspecified site: Secondary | ICD-10-CM

## 2021-11-08 DIAGNOSIS — Z0000 Encounter for general adult medical examination without abnormal findings: Secondary | ICD-10-CM | POA: Diagnosis not present

## 2021-11-08 DIAGNOSIS — Z79899 Other long term (current) drug therapy: Secondary | ICD-10-CM | POA: Diagnosis not present

## 2021-11-08 DIAGNOSIS — Z23 Encounter for immunization: Secondary | ICD-10-CM | POA: Diagnosis not present

## 2021-11-10 ENCOUNTER — Other Ambulatory Visit: Payer: Self-pay | Admitting: Family Medicine

## 2021-11-10 DIAGNOSIS — Z1231 Encounter for screening mammogram for malignant neoplasm of breast: Secondary | ICD-10-CM

## 2021-11-14 DIAGNOSIS — Z23 Encounter for immunization: Secondary | ICD-10-CM | POA: Diagnosis not present

## 2021-11-14 DIAGNOSIS — E782 Mixed hyperlipidemia: Secondary | ICD-10-CM | POA: Diagnosis not present

## 2021-11-14 DIAGNOSIS — G479 Sleep disorder, unspecified: Secondary | ICD-10-CM | POA: Diagnosis not present

## 2021-11-14 DIAGNOSIS — K219 Gastro-esophageal reflux disease without esophagitis: Secondary | ICD-10-CM | POA: Diagnosis not present

## 2021-11-14 DIAGNOSIS — I10 Essential (primary) hypertension: Secondary | ICD-10-CM | POA: Diagnosis not present

## 2021-11-14 DIAGNOSIS — Z6823 Body mass index (BMI) 23.0-23.9, adult: Secondary | ICD-10-CM | POA: Diagnosis not present

## 2021-11-14 DIAGNOSIS — R350 Frequency of micturition: Secondary | ICD-10-CM | POA: Diagnosis not present

## 2021-11-14 DIAGNOSIS — F3341 Major depressive disorder, recurrent, in partial remission: Secondary | ICD-10-CM | POA: Diagnosis not present

## 2021-11-14 DIAGNOSIS — M810 Age-related osteoporosis without current pathological fracture: Secondary | ICD-10-CM | POA: Diagnosis not present

## 2021-12-06 ENCOUNTER — Ambulatory Visit
Admission: RE | Admit: 2021-12-06 | Discharge: 2021-12-06 | Disposition: A | Discharge status: Home / Self Care | Payer: PPO | Source: Ambulatory Visit | Attending: Family Medicine | Admitting: Family Medicine

## 2021-12-06 DIAGNOSIS — Z1231 Encounter for screening mammogram for malignant neoplasm of breast: Secondary | ICD-10-CM

## 2021-12-13 DIAGNOSIS — L578 Other skin changes due to chronic exposure to nonionizing radiation: Secondary | ICD-10-CM | POA: Diagnosis not present

## 2021-12-13 DIAGNOSIS — C44529 Squamous cell carcinoma of skin of other part of trunk: Secondary | ICD-10-CM | POA: Diagnosis not present

## 2021-12-13 DIAGNOSIS — Z85828 Personal history of other malignant neoplasm of skin: Secondary | ICD-10-CM | POA: Diagnosis not present

## 2021-12-13 DIAGNOSIS — D485 Neoplasm of uncertain behavior of skin: Secondary | ICD-10-CM | POA: Diagnosis not present

## 2021-12-13 DIAGNOSIS — L820 Inflamed seborrheic keratosis: Secondary | ICD-10-CM | POA: Diagnosis not present

## 2021-12-13 DIAGNOSIS — L821 Other seborrheic keratosis: Secondary | ICD-10-CM | POA: Diagnosis not present

## 2021-12-13 DIAGNOSIS — D225 Melanocytic nevi of trunk: Secondary | ICD-10-CM | POA: Diagnosis not present

## 2021-12-13 DIAGNOSIS — L570 Actinic keratosis: Secondary | ICD-10-CM | POA: Diagnosis not present

## 2021-12-13 DIAGNOSIS — Z86018 Personal history of other benign neoplasm: Secondary | ICD-10-CM | POA: Diagnosis not present

## 2021-12-13 DIAGNOSIS — Z85820 Personal history of malignant melanoma of skin: Secondary | ICD-10-CM | POA: Diagnosis not present

## 2021-12-13 DIAGNOSIS — L814 Other melanin hyperpigmentation: Secondary | ICD-10-CM | POA: Diagnosis not present

## 2021-12-13 DIAGNOSIS — Z411 Encounter for cosmetic surgery: Secondary | ICD-10-CM | POA: Diagnosis not present

## 2021-12-14 DIAGNOSIS — M810 Age-related osteoporosis without current pathological fracture: Secondary | ICD-10-CM | POA: Diagnosis not present

## 2021-12-29 DIAGNOSIS — C44529 Squamous cell carcinoma of skin of other part of trunk: Secondary | ICD-10-CM | POA: Diagnosis not present

## 2022-01-05 DIAGNOSIS — Z23 Encounter for immunization: Secondary | ICD-10-CM | POA: Diagnosis not present

## 2022-01-09 IMAGING — MG DIGITAL SCREENING BILAT W/ TOMO W/ CAD
8 series · 8 of 24 positions shown · non-contrast
Comparison: Previous exam(s).

CLINICAL DATA: Screening.

EXAM:
DIGITAL SCREENING BILATERAL MAMMOGRAM WITH TOMO AND CAD

[R MLO synth-2D]
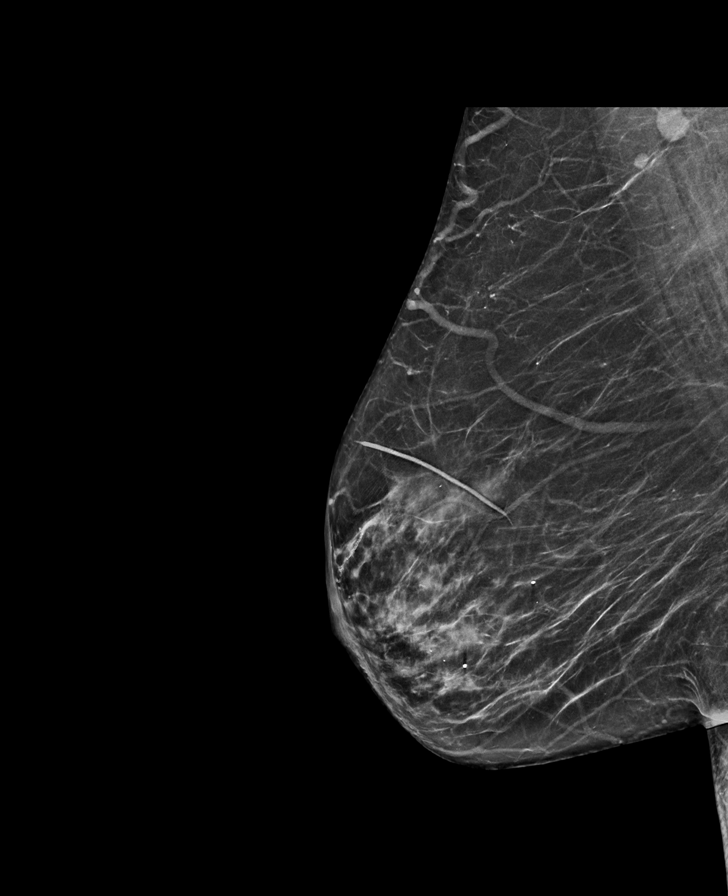

[L MLO synth-2D]
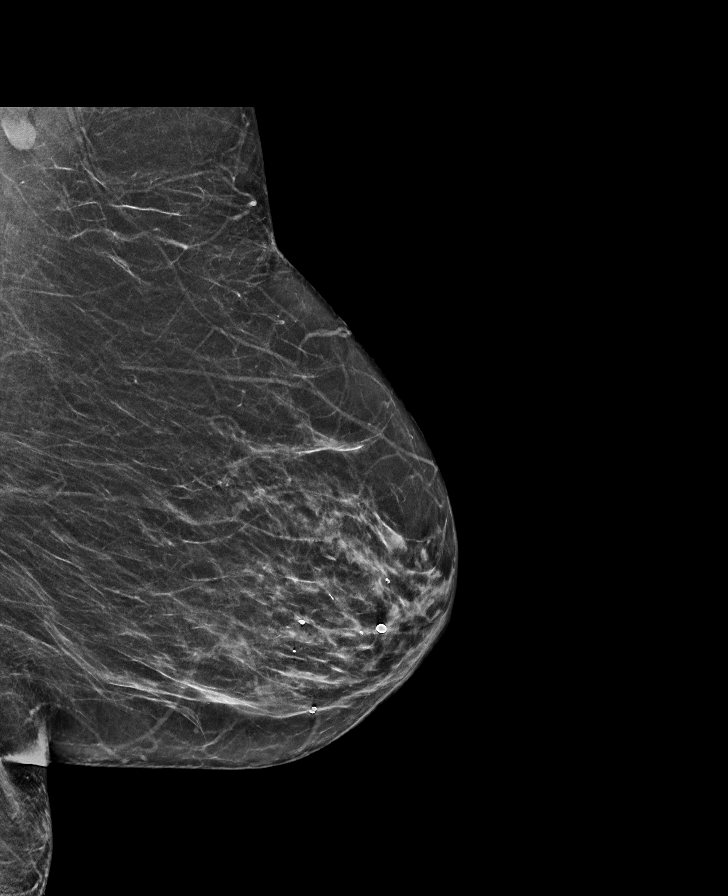

[L CC synth-2D]
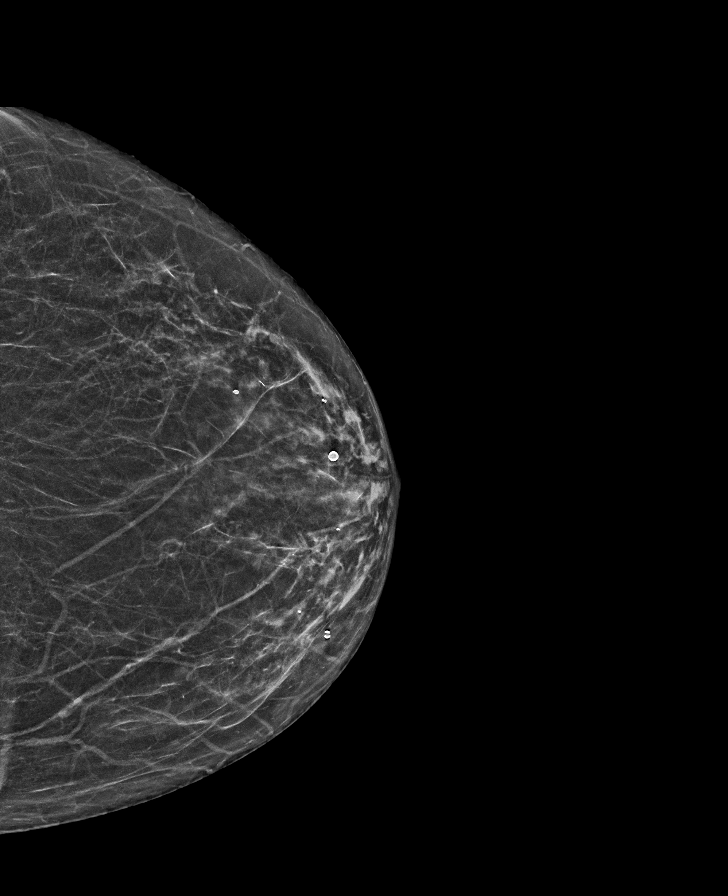

[R CC synth-2D]
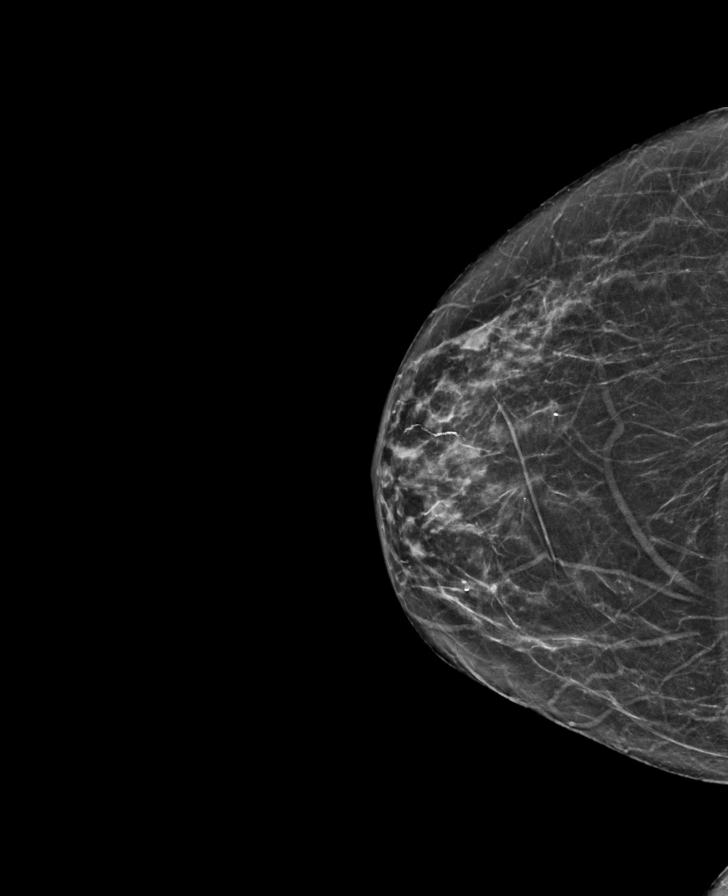

[L CC tomo · tomo slice 26/51.0]
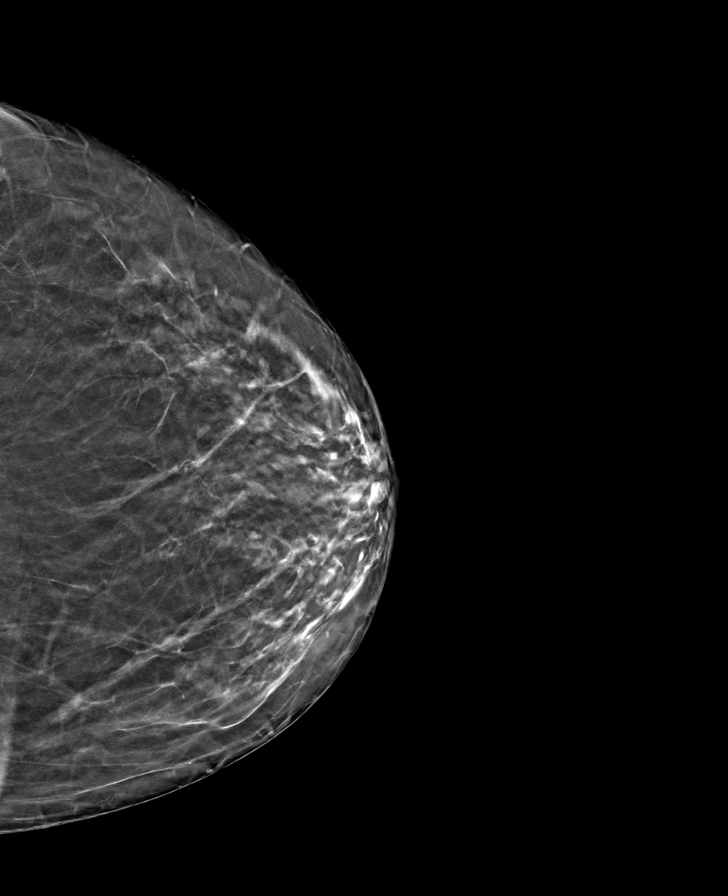

[R MLO tomo · tomo slice 31/62.0]
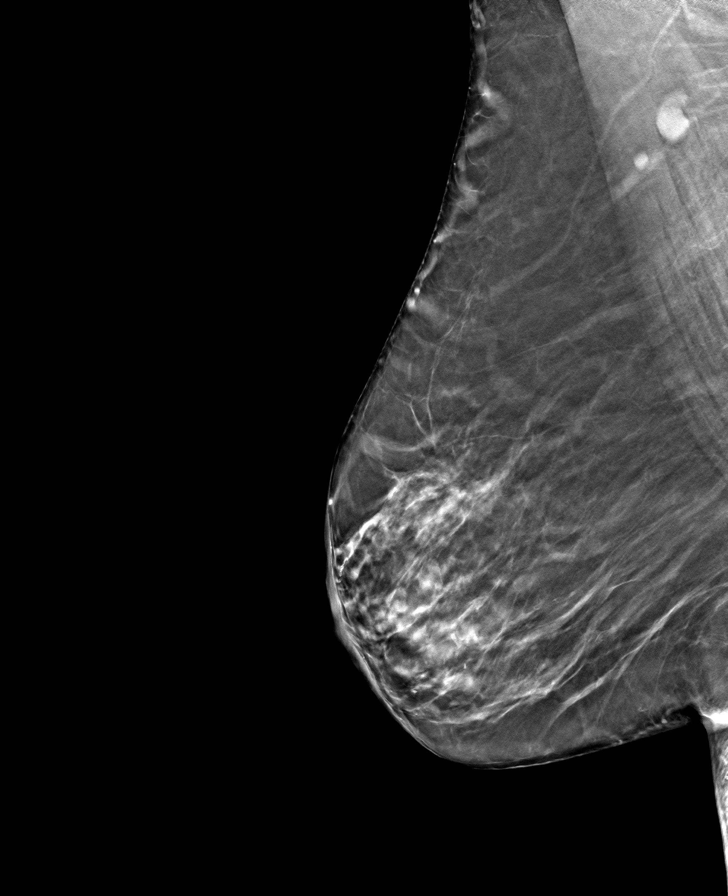

[R CC tomo · tomo slice 29/56.0]
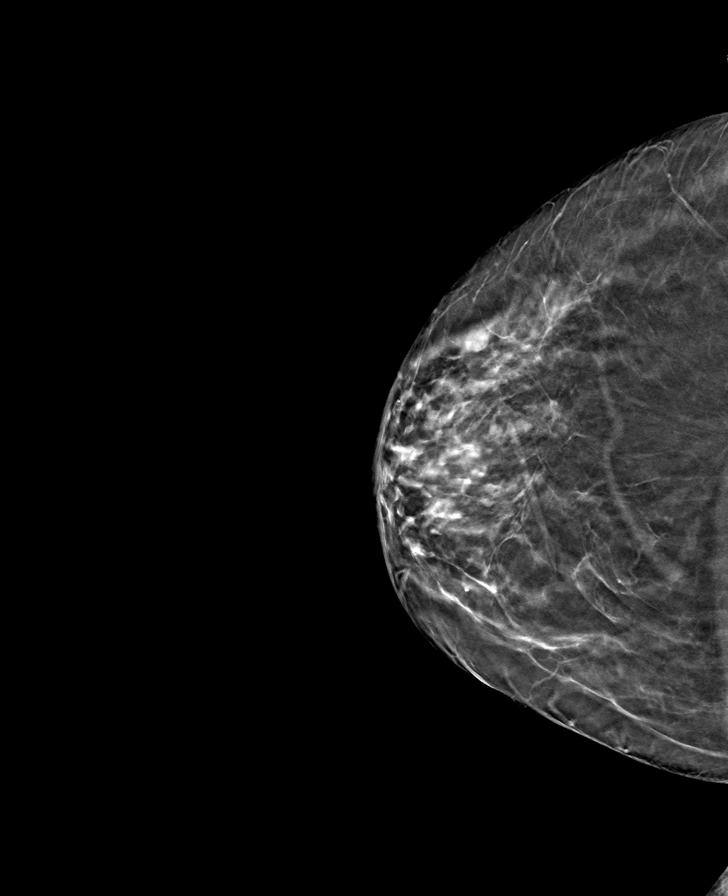

[L MLO tomo · tomo slice 29/58.0]
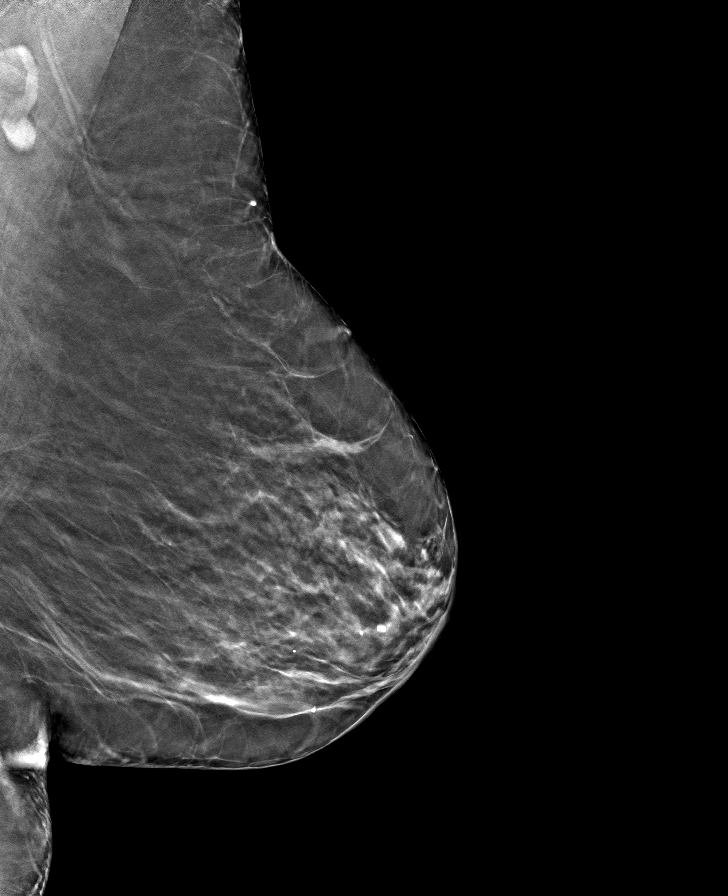

[8 of 24 positions shown; findings below may reference images not displayed]

ACR Breast Density Category b: There are scattered areas of
fibroglandular density.
FINDINGS: There are no findings suspicious for malignancy. Images were
processed with CAD.
IMPRESSION: No mammographic evidence of malignancy. A result letter of this
screening mammogram will be mailed directly to the patient.

RECOMMENDATION:
Screening mammogram in one year. (Code:[id number])

BI-RADS CATEGORY  1: Negative.

## 2022-01-10 DIAGNOSIS — L309 Dermatitis, unspecified: Secondary | ICD-10-CM | POA: Diagnosis not present

## 2022-01-24 DIAGNOSIS — F411 Generalized anxiety disorder: Secondary | ICD-10-CM | POA: Diagnosis not present

## 2022-01-24 DIAGNOSIS — F4310 Post-traumatic stress disorder, unspecified: Secondary | ICD-10-CM | POA: Diagnosis not present

## 2022-02-09 DIAGNOSIS — Z9889 Other specified postprocedural states: Secondary | ICD-10-CM | POA: Diagnosis not present

## 2022-02-09 DIAGNOSIS — K3184 Gastroparesis: Secondary | ICD-10-CM | POA: Diagnosis not present

## 2022-02-23 ENCOUNTER — Telehealth: Payer: Self-pay | Admitting: Internal Medicine

## 2022-02-23 ENCOUNTER — Other Ambulatory Visit: Payer: Self-pay

## 2022-02-23 DIAGNOSIS — E782 Mixed hyperlipidemia: Secondary | ICD-10-CM

## 2022-02-23 NOTE — Telephone Encounter (Signed)
Pt. Of Dr. Debara Pickett, routing to correct triage pool

## 2022-02-23 NOTE — Telephone Encounter (Signed)
Spoke with patient who reported that on 12/24 she was at neighbor's house and bent over to pet a cat. When she did, she had a syncopal episode that lasted about 10 seconds (according to neighbor). She did not fall (was holding onto counter). She drank lots of water and felt better for a bit. Since then, she has had dizziness, lightheadedness, blurry vision both eyes, weakness in both legs. She gets SOB with walking. BP ranges from 90/48 to 137/90. Recommended that if she has another syncopal episode, to cal 911. She stated her son is a first responder. Recommended she contact PCP to make him aware. Made appointment with E. Monge for 02/28/22.

## 2022-02-23 NOTE — Telephone Encounter (Signed)
Pt c/o Syncope: STAT if syncope occurred within 30 minutes and pt complains of lightheadedness High Priority if episode of passing out, completely, today or in last 24 hours   Did you pass out today? No  When is the last time you passed out? 12/24   Has this occurred multiple times? No - 1st time   Did you have any symptoms prior to passing out? Blurry vision, dizziness, lightheaded, legs weak when standing, and nauseated  Pt would like to be seen if possibe

## 2022-02-28 ENCOUNTER — Ambulatory Visit: Payer: PPO | Admitting: Nurse Practitioner

## 2022-02-28 NOTE — Progress Notes (Deleted)
Office Visit    Patient Name: Bonnie Juarez Date of Encounter: 02/28/2022  Primary Care Provider:  Cari Caraway, MD Primary Cardiologist:  Pixie Casino, MD  Chief Complaint    77 year old female with a history of syncope, mild coronary artery calcification, hypertension, mixed dyslipidemia with statin intolerance anxiety, depression, and fibromyalgia who presents for follow-up related to syncope.  Past Medical History    Past Medical History:  Diagnosis Date   Anxiety    Arthritis    Cancer ([place])    Hx: of squamouscell carcinoma on Left shin basal cell on face; melanoma on right lower leg   Complication of anesthesia    Nov 12, 2003 deceased LOC and hypoxia overmedicating with Morphine and Phenergan; received Narcan X 2, overnight pulse ox   Depression    Fibromyalgia    Frequent urination    GERD (gastroesophageal reflux disease)    Hx: of   H/O hiatal hernia    PMH: of   Headache(784.0)    Hypertension    IBS (irritable bowel syndrome)    Hx: of   Melanoma ([place])    Right leg   Osteopenia    Hx: of per pt, pt. adds osteoporosis- DOS   Pneumonia    hx   PONV (postoperative nausea and vomiting)    Past Surgical History:  Procedure Laterality Date   APPENDECTOMY     ARCUATE KERATECTOMY     BREAST LUMPECTOMY Right [place]   FRACTURE SURGERY Right 2014   humerus   HERNIA REPAIR     hiatal   KNEE ARTHROSCOPY Right 2015   NICEN FUNDO PLACATION  3419   NISSEN FUNDOPLICATION N/A 3/79/0240   Procedure: EXPLORATION OF FORGUT WITH ENDOSCOPY AND HITAL HERNIA CLOSURE, UPPER ENDOSCOPY;  Surgeon: Johnathan Hausen, MD;  [facility];  Service: General;  Laterality: N/A;   ORIF HUMERUS FRACTURE Right 01/08/2013   Procedure: OPEN REDUCTION INTERNAL FIXATION (ORIF) PROXIMAL HUMERUS FRACTURE;  Surgeon: Marybelle Killings, MD;  Location: [facility];  Service: Orthopedics;  Laterality: Right;  Open Reduction Internal Fixation Right Proximal Humerus Fracture   POSTERIOR  CERVICAL FUSION/FORAMINOTOMY N/A 12/20/2016   Procedure: Posterior Cervical Fusion with lateral mass fixation - Cervical Three-Thoracic Two;  Surgeon: Eustace Moore, MD;  Location: [facility];  Service: Neurosurgery;  Laterality: N/A;   RADIAL HEAD REPLACEMENT  2005   trigger thrumb release Left 2001   tubular plasity  1970   XI ROBOTIC ASSISTED HIATAL HERNIA REPAIR N/A 12/06/2020   Procedure: XI ROBOTIC ASSISTED REDO HIATAL HERNIA REPAIR;  Surgeon: Johnathan Hausen, MD;  [facility];  Service: General;  Laterality: N/A;    Allergies  Allergies  Allergen Reactions   Boniva [Ibandronic Acid] Other (See Comments)    Bone Pain   Dilaudid [Hydromorphone Hcl] Other (See Comments)    Hallucinations   Risedronate Sodium Other (See Comments)    Bone pain   Statins Other (See Comments)    History of Present Illness    77 year old female with the above past medical history including syncope, mild coronary artery calcification, hypertension, mixed dyslipidemia with statin intolerance anxiety, depression, and fibromyalgia.  Echocardiogram in 2014 showed normal LV function, no significant valvular abnormalities.  She has a strong history of CAD.  Coronary calcium score in 2019 was 80 (59th percentile).  He has followed with Dr. Debara Pickett for management of dyslipidemia with statin intolerance, on Repatha.  She was last seen in the office on 03/15/2021  and was stable from a cardiac standpoint. She contacted our office on 02/23/2022 and reported a syncopal episode that occurred on 02/19/2022 while she was at her neighbor's house.  She bent over to pet a cat and had a syncopal episode which lasted approximately 10 seconds, patient did not fall.  Following her syncopal episode she noted ongoing dizziness, lightheadedness, blurry vision in both eyes, and weakness in both legs.  She also noted dyspnea on exertion, SBP ranging from 90-137.   She presents today for follow-up.  Since she contacted our  office  Presyncope/syncope: Coronary artery calcification: Hypertension: Mixed dyslipidemia: Disposition:   Home Medications    Current Outpatient Medications  Medication Sig Dispense Refill   acetaminophen (TYLENOL) 500 MG tablet Take 500-1,000 mg by mouth every 6 (six) hours as needed for moderate pain.     Ascorbic Acid (VITAMIN C) 1000 MG tablet Take 1,000 mg by mouth daily.     cholecalciferol (VITAMIN D3) 25 MCG (1000 UNIT) tablet Take 1,000 Units by mouth every other day.     Coenzyme Q10 (COQ10) 200 MG CAPS Take 200 mg by mouth daily.     cromolyn (NASALCROM) 5.2 MG/ACT nasal spray Place 1 spray into both nostrils 2 (two) times daily as needed for allergies.     denosumab (PROLIA) 60 MG/ML SOSY injection Inject 60 mg into the skin every 6 (six) months.     escitalopram (LEXAPRO) 20 MG tablet Take 40 mg by mouth at bedtime.  2   Evolocumab (REPATHA) 140 MG/ML SOSY Inject 140 mg into the skin every 14 (fourteen) days. 6 mL 1   loratadine (CLARITIN) 10 MG tablet Take 10 mg by mouth daily.     olmesartan (BENICAR) 20 MG tablet Take 20 mg by mouth daily.     pantoprazole (PROTONIX) 20 MG tablet Take 20 mg by mouth in the morning.     pantoprazole (PROTONIX) 40 MG tablet Take 40 mg by mouth every evening.     Polyethyl Glycol-Propyl Glycol (SYSTANE) 0.4-0.3 % SOLN Place 1-2 drops into both eyes 3 (three) times daily as needed (dry/irritated eyes).     traZODone (DESYREL) 100 MG tablet Take 100 mg by mouth at bedtime.   2   vitamin B-12 (CYANOCOBALAMIN) 1000 MCG tablet Take 1,000 mcg by mouth every other day.     No current facility-administered medications for this visit.     Review of Systems    ***.  All other systems reviewed and are otherwise negative except as noted above.    Physical Exam    VS:  There were no vitals taken for this visit. , BMI There is no height or weight on file to calculate BMI.     GEN: Well nourished, well developed, in no acute distress. HEENT:  normal. Neck: Supple, no JVD, carotid bruits, or masses. Cardiac: RRR, no murmurs, rubs, or gallops. No clubbing, cyanosis, edema.  Radials/DP/PT 2+ and equal bilaterally.  Respiratory:  Respirations regular and unlabored, clear to auscultation bilaterally. GI: Soft, nontender, nondistended, BS + x 4. MS: no deformity or atrophy. Skin: warm and dry, no rash. Neuro:  Strength and sensation are intact. Psych: Normal affect.  Accessory Clinical Findings    ECG personally reviewed by me today - *** - no acute changes.   Lab Results  Component Value Date   WBC 9.6 12/06/2020   HGB 13.6 12/06/2020   HCT 42.0 12/06/2020   MCV 90.3 12/06/2020   PLT 202 12/06/2020   Lab Results  Component Value Date   CREATININE 0.78 12/06/2020   BUN 13 11/15/2020   NA 140 11/15/2020   K 4.2 11/15/2020   CL 106 11/15/2020   CO2 27 11/15/2020   Lab Results  Component Value Date   ALT 15 01/06/2013   AST 18 01/06/2013   ALKPHOS 68 01/06/2013   BILITOT 0.6 01/06/2013   Lab Results  Component Value Date   CHOL 175 03/08/2021   HDL 78 03/08/2021   LDLCALC 80 03/08/2021   TRIG 95 03/08/2021   CHOLHDL 2.2 03/08/2021    No results found for: "HGBA1C"  Assessment & Plan    1.  ***  No BP recorded.  {Refresh Note OR Click here to enter BP  :1}***   Lenna Sciara, NP 02/28/2022, 4:43 AM

## 2022-03-01 NOTE — Progress Notes (Unsigned)
Cardiology Clinic Note   Patient Name: Bonnie Juarez Date of Encounter: 03/02/2022  Primary Care Provider:  Cari Caraway, MD Primary Cardiologist:  Pixie Casino, MD  Patient Profile    Bonnie Juarez is a 77 y.o. female with a past medical history of hypertension, hyperlipidemia, fibromyalgia who presents to the clinic today for syncopal episode.  Past Medical History    Past Medical History:  Diagnosis Date   Anxiety    Arthritis    Cancer ([place])    Hx: of squamouscell carcinoma on Left shin basal cell on face; melanoma on right lower leg   Complication of anesthesia    11/17/03 deceased LOC and hypoxia overmedicating with Morphine and Phenergan; received Narcan X 2, overnight pulse ox   Depression    Fibromyalgia    Frequent urination    GERD (gastroesophageal reflux disease)    Hx: of   H/O hiatal hernia    PMH: of   Headache(784.0)    Hypertension    IBS (irritable bowel syndrome)    Hx: of   Melanoma ([place])    Right leg   Osteopenia    Hx: of per pt, pt. adds osteoporosis- DOS   Pneumonia    hx   PONV (postoperative nausea and vomiting)    Past Surgical History:  Procedure Laterality Date   APPENDECTOMY     ARCUATE KERATECTOMY     BREAST LUMPECTOMY Right [place]   FRACTURE SURGERY Right 2014   humerus   HERNIA REPAIR     hiatal   KNEE ARTHROSCOPY Right 2015   NICEN FUNDO PLACATION  6237   NISSEN FUNDOPLICATION N/A 08/24/3149   Procedure: EXPLORATION OF FORGUT WITH ENDOSCOPY AND HITAL HERNIA CLOSURE, UPPER ENDOSCOPY;  Surgeon: Johnathan Hausen, MD;  Location: [facility];  Service: General;  Laterality: N/A;   ORIF HUMERUS FRACTURE Right 01/08/2013   Procedure: OPEN REDUCTION INTERNAL FIXATION (ORIF) PROXIMAL HUMERUS FRACTURE;  Surgeon: Marybelle Killings, MD;  Location: [facility];  Service: Orthopedics;  Laterality: Right;  Open Reduction Internal Fixation Right Proximal Humerus Fracture   POSTERIOR CERVICAL FUSION/FORAMINOTOMY N/A 12/20/2016    Procedure: Posterior Cervical Fusion with lateral mass fixation - Cervical Three-Thoracic Two;  Surgeon: Eustace Moore, MD;  Location: [facility];  Service: Neurosurgery;  Laterality: N/A;   RADIAL HEAD REPLACEMENT  2005   trigger thrumb release Left 2001   tubular plasity  1970   XI ROBOTIC ASSISTED HIATAL HERNIA REPAIR N/A 12/06/2020   Procedure: XI ROBOTIC ASSISTED REDO HIATAL HERNIA REPAIR;  Surgeon: Johnathan Hausen, MD;  Location: [facility];  Service: General;  Laterality: N/A;    Allergies  Allergies  Allergen Reactions   Boniva [Ibandronic Acid] Other (See Comments)    Bone Pain   Dilaudid [Hydromorphone Hcl] Other (See Comments)    Hallucinations   Risedronate Sodium Other (See Comments)    Bone pain   Statins Other (See Comments)    History of Present Illness    Bonnie Juarez has a past medical history of: Hypertension. Hyperlipidemia. Calcium scoring 11/21/2017: Coronary calcium score 70 (67th percentile), calcium noted proximal circumflex, proximal LAD, mid LAD. Lipid panel 03/08/2021: LDL 80, HDL 78, TG 95, total 175. Fibromyalgia  Ms. Skirvin was first evaluated by Dr. Debara Pickett on 11/13/2017 for dyslipidemia at the request of Dr. Leonides Schanz, PCP.  She reported a strong family history of heart disease mostly in her mother who had 2 stents at age 19 and  a stroke in her 28s.  There is also history of heart failure, diabetes and possible MI in her siblings.  Patient's lipid panel at that time showed an LDL of 154, HDL 76, TG 103, total cholesterol 250.  She was intolerant to statins secondary to myalgias and was only tolerating rosuvastatin 10 mg once weekly.  Coronary calcium scoring showed calcium score of 70 (67th percentile), calcium noted in proximal circumflex, proximal LAD and mid LAD.  Started on Repatha.  Patient was last seen in the lipid clinic by Dr. Debara Pickett on 03/15/2021.  At that time she was doing well having lost some weight somewhat unintentionally secondary to gastroparesis.  LDL was  at goal at that time.  Recently patient called in to the office on 02/23/2022 secondary to syncopal episode occurring on 02/19/2022.  Patient reported to triage RN that on 12/24 she bent over to pet neighbors cat and had a syncopal episode that lasted about 10 seconds.  She did not fall.  She drank a lot of water and felt better.  Since that time she complains of dizziness, lightheadedness, blurred vision in both eyes, and weakness in both legs.  She reports DOE with walking and BP ranges from 90/48 to 137/90.  Today, patient is alone.  In discussing the above event she does not feel she "went out completely" as she remembers gripping the counter and feeling dizzy.  However, her neighbor stated her eyes rolled back and she was "gone" for 6 to 7 seconds.  She did not check her BP directly following the incident secondary to her son calling her.  She has not had another syncopal event but she reports she realized for the past few weeks she has had occasional blurred vision that gets somewhat better with reading glasses, some lightheadedness, flushed feeling and leg weakness.  There is no pattern to the symptoms.  She last had her eyes checked January 2023 and everything was okay.  She endorses increased stress and depression secondary to her husband's state of health.  She states she has not been exercising like she normally does secondary to her husband being in the hospital and both of them feeling depressed over his prognosis. She reports mild dyspnea when walking up stairs. She denies chest pain, palpitations or shortness of breath at rest.    Home Medications    Current Meds  Medication Sig   acetaminophen (TYLENOL) 500 MG tablet Take 500-1,000 mg by mouth every 6 (six) hours as needed for moderate pain.   Ascorbic Acid (VITAMIN C) 1000 MG tablet Take 1,000 mg by mouth daily.   cholecalciferol (VITAMIN D3) 25 MCG (1000 UNIT) tablet Take 1,000 Units by mouth every other day.   Coenzyme Q10 (COQ10)  200 MG CAPS Take 200 mg by mouth daily.   cromolyn (NASALCROM) 5.2 MG/ACT nasal spray Place 1 spray into both nostrils 2 (two) times daily as needed for allergies.   denosumab (PROLIA) 60 MG/ML SOSY injection Inject 60 mg into the skin every 6 (six) months.   escitalopram (LEXAPRO) 20 MG tablet Take 40 mg by mouth at bedtime.   Evolocumab (REPATHA) 140 MG/ML SOSY Inject 140 mg into the skin every 14 (fourteen) days.   loratadine (CLARITIN) 10 MG tablet Take 10 mg by mouth daily.   olmesartan (BENICAR) 20 MG tablet Take 20 mg by mouth daily.   Polyethyl Glycol-Propyl Glycol (SYSTANE) 0.4-0.3 % SOLN Place 1-2 drops into both eyes 3 (three) times daily as needed (dry/irritated eyes).  RABEprazole (ACIPHEX) 20 MG tablet Take 20 mg by mouth daily.   traZODone (DESYREL) 100 MG tablet Take 100 mg by mouth at bedtime.    vitamin B-12 (CYANOCOBALAMIN) 1000 MCG tablet Take 1,000 mcg by mouth every other day.   [DISCONTINUED] pantoprazole (PROTONIX) 20 MG tablet Take 20 mg by mouth in the morning.   [DISCONTINUED] pantoprazole (PROTONIX) 40 MG tablet Take 40 mg by mouth every evening.    Family History    Family History  Problem Relation Age of Onset   Heart disease Other    Lung disease Other    Breast cancer Neg Hx    She indicated that the status of her neg hx is unknown. She indicated that the status of her other is unknown.   Social History    Social History   Socioeconomic History   Marital status: Married    Spouse name: Not on file   Number of children: Not on file   Years of education: Not on file   Highest education level: Not on file  Occupational History   Not on file  Tobacco Use   Smoking status: Former    Packs/day: 1.00    Years: 15.00    Total pack years: 15.00    Types: Cigarettes    Quit date: 12/12/1980    Years since quitting: 41.2   Smokeless tobacco: Never   Tobacco comments:    Quit 1982"  Vaping Use   Vaping Use: Never used  Substance and Sexual  Activity   Alcohol use: Yes    Alcohol/week: 7.0 - 10.0 standard drinks of alcohol    Types: 7 - 10 Glasses of wine per week    Comment: occ glass of owine   Drug use: No   Sexual activity: Not on file  Other Topics Concern   Not on file  Social History Narrative   Not on file   Social Determinants of Health   Financial Resource Strain: Not on file  Food Insecurity: Not on file  Transportation Needs: Not on file  Physical Activity: Not on file  Stress: Not on file  Social Connections: Not on file  Intimate Partner Violence: Not on file     Review of Systems    General:  No chills, fever, night sweats or weight changes. Positive for dizziness and lightheadedness.  Cardiovascular:  No chest pain, dyspnea on exertion, edema, orthopnea, palpitations, paroxysmal nocturnal dyspnea. Dermatological: No rash, lesions/masses Respiratory: No cough, dyspnea Urologic: No hematuria, dysuria Abdominal:   No nausea, vomiting, diarrhea, bright red blood per rectum, melena, or hematemesis Neurologic:  Positive for blurred vision, weakness, and flushing. All other systems reviewed and are otherwise negative except as noted above.  Physical Exam    VS:  BP (!) 124/56 (BP Location: Left Arm, Patient Position: Sitting, Cuff Size: Normal)   Pulse (!) 53   Ht '5\' 6"'$  (1.676 m)   Wt 147 lb (66.7 kg)   BMI 23.73 kg/m  , BMI Body mass index is 23.73 kg/m.  Orthostatic VS for the past 24 hrs (Last 3 readings):  BP- Lying Pulse- Lying BP- Sitting Pulse- Sitting BP- Standing at 0 minutes Pulse- Standing at 0 minutes BP- Standing at 3 minutes Pulse- Standing at 3 minutes  03/02/22 1501 124/64 54 137/71 53 109/71 63 128/73 57    GEN:  Well nourished, well developed, in no acute distress. HEENT: Normal. Neck: Supple, no JVD, carotid bruits, or masses. Cardiac: RRR, no murmurs, rubs, or gallops.  No clubbing, cyanosis, edema.  Radials/DP/PT 2+ and equal bilaterally.  Respiratory:  Respirations regular  and unlabored, clear to auscultation bilaterally. GI: Soft, nontender, nondistended. MS: No deformity or atrophy. Skin: Warm and dry, no rash. Neuro: Strength and sensation are intact. Psych: Normal affect.  Accessory Clinical Findings   Recent Labs: No results found for requested labs within last 365 days.   Recent Lipid Panel    Component Value Date/Time   CHOL 175 03/08/2021 1103   TRIG 95 03/08/2021 1103   HDL 78 03/08/2021 1103   CHOLHDL 2.2 03/08/2021 1103   LDLCALC 80 03/08/2021 1103       ECG personally reviewed by me today: Sinus bradycardia, heart rate 53.  No significant changes from 04/06/2020.      Assessment & Plan   Syncope.  Patient reports syncopal event on 02/19/2022 while bending over to pet a cat.  Since that time she complains of dizziness, lightheadedness, blurred vision in both eyes, and weakness in both legs.  She denies palpitations, chest pain or shortness of breath at rest.  EKG today shows sinus bradycardia, heart rate 53.  Will get a CBC, BMP, TSH, echo, 14-day live ZIO, and bilateral carotid Doppler. Dizziness/lightheadedness.  Patient reports after syncopal episode she realized a several week history of feeling lightheaded/dizzy with some associated blurred vision that improves with reading glasses, flushed feeling, leg weakness.  There is no pattern to the symptoms.  Will get labs and testing as above (see #1). DOE.  Patient reports she has not been exercising as she normally does but she has noticed some shortness of breath when climbing stairs.  This is new for her.  Echo pending as above.  Orthostatic hypotension.  Vital signs as below.  Given patient's symptoms and home blood pressure readingsfrom 90/48 to 137/90 as well as patient's report that BP has been on the low side at her PCPs office, will have patient reduce olmesartan to 10 mg daily.  She will keep a BP log and call if her BP is trending high.  Orthostatic VS for the past 24 hrs (Last 3  readings):  BP- Lying Pulse- Lying BP- Sitting Pulse- Sitting BP- Standing at 0 minutes Pulse- Standing at 0 minutes BP- Standing at 3 minutes Pulse- Standing at 3 minutes  03/02/22 1501 124/64 54 137/71 53 109/71 63 128/73 57       Disposition: CBC, BMP, TSH today.  Echo, bilateral carotid Dopplers, 14-day live ZIO.  Reduce olmesartan to 10 mg daily.  Return in 6 to 8 weeks or sooner as needed.   Justice Britain. Jacqueline Spofford, DNP, NP-C     03/02/2022, 3:30 PM [place] [address] Office [phone] Fax [phone]

## 2022-03-02 ENCOUNTER — Ambulatory Visit: Payer: PPO | Attending: Nurse Practitioner | Admitting: Student

## 2022-03-02 ENCOUNTER — Other Ambulatory Visit (INDEPENDENT_AMBULATORY_CARE_PROVIDER_SITE_OTHER): Payer: PPO

## 2022-03-02 ENCOUNTER — Encounter: Payer: Self-pay | Admitting: Nurse Practitioner

## 2022-03-02 VITALS — BP 124/56 | HR 53 | Ht 66.0 in | Wt 147.0 lb

## 2022-03-02 DIAGNOSIS — R0609 Other forms of dyspnea: Secondary | ICD-10-CM

## 2022-03-02 DIAGNOSIS — Z79899 Other long term (current) drug therapy: Secondary | ICD-10-CM

## 2022-03-02 DIAGNOSIS — R55 Syncope and collapse: Secondary | ICD-10-CM | POA: Diagnosis not present

## 2022-03-02 DIAGNOSIS — R42 Dizziness and giddiness: Secondary | ICD-10-CM

## 2022-03-02 NOTE — Patient Instructions (Signed)
Medication Instructions:  Olmesartan 10 mg daily  *If you need a refill on your cardiac medications before your next appointment, please call your pharmacy*   Lab Work: Your physician recommends that you continue on complete lab work today. CBC, BMET, TSH  If you have labs (blood work) drawn today and your tests are completely normal, you will receive your results only by: [place] (if you have MyChart) OR A paper copy in the mail If you have any lab test that is abnormal or we need to change your treatment, we will call you to review the results.   Testing/Procedures: Your physician has requested that you have an echocardiogram. Echocardiography is a painless test that uses sound waves to create images of your heart. It provides your doctor with information about the size and shape of your heart and how well your heart's chambers and valves are working. This procedure takes approximately one hour. There are no restrictions for this procedure. Please do NOT wear cologne, perfume, aftershave, or lotions (deodorant is allowed). Please arrive 15 minutes prior to your appointment time.   Your physician has requested that you have a carotid duplex. This test is an ultrasound of the carotid arteries in your neck. It looks at blood flow through these arteries that supply the brain with blood. Allow one hour for this exam. There are no restrictions or special instructions.   ZIO XT- Long Term Monitor Instructions  Your physician has requested you wear a ZIO patch monitor for 14 days.  This is a single patch monitor. [employer] supplies one patch monitor per enrollment. Additional stickers are not available. Please do not apply patch if you will be having a Nuclear Stress Test,  Echocardiogram, Cardiac CT, MRI, or Chest Xray during the period you would be wearing the  monitor. The patch cannot be worn during these tests. You cannot remove and re-apply the  ZIO XT patch monitor.  Your ZIO  patch monitor will be mailed 3 day USPS to your address on file. It may take 3-5 days  to receive your monitor after you have been enrolled.  Once you have received your monitor, please review the enclosed instructions. Your monitor  has already been registered assigning a specific monitor serial # to you.  Billing and Patient Assistance Program Information  We have supplied [employer] with any of your insurance information on file for billing purposes. [employer] offers a sliding scale Patient Assistance Program for patients that do not have  insurance, or whose insurance does not completely cover the cost of the ZIO monitor.  You must apply for the Patient Assistance Program to qualify for this discounted rate.  To apply, please call [employer] at [phone], select option 4, select option 2, ask to apply for  Patient Assistance Program. Theodore Demark will ask your household income, and how many people  are in your household. They will quote your out-of-pocket cost based on that information.  [employer] will also be able to set up a 5-month interest-free payment plan if needed.  Applying the monitor   Shave hair from upper left chest.  Hold abrader disc by orange tab. Rub abrader in 40 strokes over the upper left chest as  indicated in your monitor instructions.  Clean area with 4 enclosed alcohol pads. Let dry.  Apply patch as indicated in monitor instructions. Patch will be placed under collarbone on left  side of chest with arrow pointing upward.  Rub patch adhesive wings for 2 minutes. Remove white label  marked "1". Remove the white  label marked "2". Rub patch adhesive wings for 2 additional minutes.  While looking in a mirror, press and release button in center of patch. A small green light will  flash 3-4 times. This will be your only indicator that the monitor has been turned on.  Do not shower for the first 24 hours. You may shower after the first 24 hours.  Press the button if you feel a  symptom. You will hear a small click. Record Date, Time and  Symptom in the Patient Logbook.  When you are ready to remove the patch, follow instructions on the last 2 pages of Patient  Logbook. Stick patch monitor onto the last page of Patient Logbook.  Place Patient Logbook in the blue and white box. Use locking tab on box and tape box closed  securely. The blue and white box has prepaid postage on it. Please place it in the mailbox as  soon as possible. Your physician should have your test results approximately 7 days after the  monitor has been mailed back to [facility].  Call [place] at [phone] if you have questions regarding  your ZIO XT patch monitor. Call them immediately if you see an orange light blinking on your  monitor.  If your monitor falls off in less than 4 days, contact our Monitor department at [phone].  If your monitor becomes loose or falls off after 4 days call [employer] at [phone] for  suggestions on securing your monitor    Follow-Up: At [facility], you and your health needs are our priority.  As part of our continuing mission to provide you with exceptional heart care, we have created designated Provider Care Teams.  These Care Teams include your primary Cardiologist (physician) and Advanced Practice Providers (APPs -  Physician Assistants and Nurse Practitioners) who all work together to provide you with the care you need, when you need it.  We recommend signing up for the patient portal called "MyChart".  Sign up information is provided on this After Visit Summary.  MyChart is used to connect with patients for Virtual Visits (Telemedicine).  Patients are able to view lab/test results, encounter notes, upcoming appointments, etc.  Non-urgent messages can be sent to your provider as well.   To learn more about what you can do with MyChart, go to [email or web address].    Your next appointment:   6-8 week(s)  The  format for your next appointment:   In Person  Provider:   Clancy Gourd NP         Other Instructions   Important Information About Sugar

## 2022-03-02 NOTE — Progress Notes (Unsigned)
Enrolled for [employer] to mail a ZIO AT Live Telemetry monitor to patients address on file.   Dr. Debara Pickett to read.

## 2022-03-03 ENCOUNTER — Telehealth: Payer: Self-pay

## 2022-03-03 LAB — CBC
Hematocrit: 37.3 % (ref 34.0–46.6)
Hemoglobin: 12.5 g/dL (ref 11.1–15.9)
MCH: 29 pg (ref 26.6–33.0)
MCHC: 33.5 g/dL (ref 31.5–35.7)
MCV: 87 fL (ref 79–97)
Platelets: 225 10*3/uL (ref 150–450)
RBC: 4.31 x10E6/uL (ref 3.77–5.28)
RDW: 12.3 % (ref 11.7–15.4)
WBC: 7.3 10*3/uL (ref 3.4–10.8)

## 2022-03-03 LAB — BASIC METABOLIC PANEL
BUN/Creatinine Ratio: 11 — ABNORMAL LOW (ref 12–28)
BUN: 10 mg/dL (ref 8–27)
CO2: 20 mmol/L (ref 20–29)
Calcium: 9.1 mg/dL (ref 8.7–10.3)
Chloride: 102 mmol/L (ref 96–106)
Creatinine, Ser: 0.91 mg/dL (ref 0.57–1.00)
Glucose: 96 mg/dL (ref 70–99)
Potassium: 4.8 mmol/L (ref 3.5–5.2)
Sodium: 140 mmol/L (ref 134–144)
eGFR: 65 mL/min/{1.73_m2} (ref >59)

## 2022-03-03 LAB — TSH: TSH: 1.85 u[IU]/mL (ref 0.450–4.500)

## 2022-03-03 NOTE — Telephone Encounter (Signed)
Lmom to discuss lab results. Waiting on a return call.  

## 2022-03-03 NOTE — Telephone Encounter (Signed)
Pt returned call. Pt was notified of lab results. Pt will await testing.

## 2022-03-07 DIAGNOSIS — M810 Age-related osteoporosis without current pathological fracture: Secondary | ICD-10-CM | POA: Diagnosis not present

## 2022-03-07 DIAGNOSIS — E041 Nontoxic single thyroid nodule: Secondary | ICD-10-CM | POA: Diagnosis not present

## 2022-03-07 DIAGNOSIS — R7303 Prediabetes: Secondary | ICD-10-CM | POA: Diagnosis not present

## 2022-03-07 DIAGNOSIS — R55 Syncope and collapse: Secondary | ICD-10-CM | POA: Diagnosis not present

## 2022-03-07 DIAGNOSIS — I10 Essential (primary) hypertension: Secondary | ICD-10-CM | POA: Diagnosis not present

## 2022-03-08 DIAGNOSIS — R55 Syncope and collapse: Secondary | ICD-10-CM | POA: Diagnosis not present

## 2022-03-14 ENCOUNTER — Ambulatory Visit (HOSPITAL_COMMUNITY)
Admission: RE | Admit: 2022-03-14 | Discharge: 2022-03-14 | Disposition: A | Discharge status: Home / Self Care | Payer: PPO | Source: Ambulatory Visit | Attending: Student | Admitting: Student

## 2022-03-14 DIAGNOSIS — R55 Syncope and collapse: Secondary | ICD-10-CM

## 2022-03-20 ENCOUNTER — Other Ambulatory Visit: Payer: Self-pay | Admitting: Internal Medicine

## 2022-03-20 ENCOUNTER — Other Ambulatory Visit: Payer: Self-pay | Admitting: Pharmacist

## 2022-03-20 DIAGNOSIS — R931 Abnormal findings on diagnostic imaging of heart and coronary circulation: Secondary | ICD-10-CM

## 2022-03-20 DIAGNOSIS — E782 Mixed hyperlipidemia: Secondary | ICD-10-CM

## 2022-03-20 DIAGNOSIS — Z789 Other specified health status: Secondary | ICD-10-CM

## 2022-03-20 LAB — LIPID PANEL
Chol/HDL Ratio: 1.9 ratio (ref 0.0–4.4)
Cholesterol, Total: 165 mg/dL (ref 100–199)
HDL: 87 mg/dL (ref >39)
LDL Chol Calc (NIH): 65 mg/dL (ref 0–99)
Triglycerides: 65 mg/dL (ref 0–149)
VLDL Cholesterol Cal: 13 mg/dL (ref 5–40)

## 2022-03-20 MED ORDER — REPATHA SURECLICK 140 MG/ML ~~LOC~~ SOAJ: 1.0000 | SUBCUTANEOUS | 11 refills | Status: DC

## 2022-03-20 NOTE — Telephone Encounter (Signed)
Request forwarded to PharmD [facility]

## 2022-03-21 ENCOUNTER — Telehealth: Payer: Self-pay

## 2022-03-21 DIAGNOSIS — M85852 Other specified disorders of bone density and structure, left thigh: Secondary | ICD-10-CM | POA: Diagnosis not present

## 2022-03-21 DIAGNOSIS — M8589 Other specified disorders of bone density and structure, multiple sites: Secondary | ICD-10-CM | POA: Diagnosis not present

## 2022-03-21 NOTE — Telephone Encounter (Signed)
Spoke with pt. Pt was notified of VAS Carotid results. Pt is aware that we are awaiting zio and echo results for further information.

## 2022-03-23 ENCOUNTER — Ambulatory Visit: Payer: PPO | Admitting: Internal Medicine

## 2022-03-28 ENCOUNTER — Other Ambulatory Visit (HOSPITAL_COMMUNITY): Payer: PPO

## 2022-03-28 DIAGNOSIS — F4310 Post-traumatic stress disorder, unspecified: Secondary | ICD-10-CM | POA: Diagnosis not present

## 2022-03-28 DIAGNOSIS — F411 Generalized anxiety disorder: Secondary | ICD-10-CM | POA: Diagnosis not present

## 2022-04-17 ENCOUNTER — Telehealth: Payer: Self-pay | Admitting: Internal Medicine

## 2022-04-17 NOTE — Telephone Encounter (Signed)
Patient calling for monitor results. 

## 2022-04-17 NOTE — Telephone Encounter (Signed)
Spoke with patient about her monitor results. Patient verbalizes understanding. No further questions or concerns at this time.

## 2022-04-18 ENCOUNTER — Ambulatory Visit (HOSPITAL_COMMUNITY): Payer: PPO | Attending: Cardiology

## 2022-04-18 DIAGNOSIS — R350 Frequency of micturition: Secondary | ICD-10-CM | POA: Diagnosis not present

## 2022-04-18 DIAGNOSIS — R55 Syncope and collapse: Secondary | ICD-10-CM | POA: Diagnosis not present

## 2022-04-18 DIAGNOSIS — N390 Urinary tract infection, site not specified: Secondary | ICD-10-CM | POA: Diagnosis not present

## 2022-04-18 DIAGNOSIS — R809 Proteinuria, unspecified: Secondary | ICD-10-CM | POA: Diagnosis not present

## 2022-04-18 DIAGNOSIS — Z6823 Body mass index (BMI) 23.0-23.9, adult: Secondary | ICD-10-CM | POA: Diagnosis not present

## 2022-04-18 LAB — ECHOCARDIOGRAM COMPLETE
Area-P 1/2: 3.34 cm2
S' Lateral: 3.1 cm

## 2022-04-19 ENCOUNTER — Telehealth: Payer: Self-pay

## 2022-04-19 NOTE — Telephone Encounter (Signed)
Spoke with pt. Pt was notified of monitor and echo result. Pt will f/u as scheduled with Dr. Geralyn Corwin next week.

## 2022-04-26 ENCOUNTER — Ambulatory Visit: Payer: PPO | Attending: Internal Medicine | Admitting: Internal Medicine

## 2022-04-26 ENCOUNTER — Encounter: Payer: Self-pay | Admitting: Internal Medicine

## 2022-04-26 VITALS — BP 108/73 | HR 68 | Ht 66.0 in | Wt 145.6 lb

## 2022-04-26 DIAGNOSIS — R42 Dizziness and giddiness: Secondary | ICD-10-CM

## 2022-04-26 DIAGNOSIS — T466X5D Adverse effect of antihyperlipidemic and antiarteriosclerotic drugs, subsequent encounter: Secondary | ICD-10-CM | POA: Diagnosis not present

## 2022-04-26 DIAGNOSIS — M791 Myalgia, unspecified site: Secondary | ICD-10-CM | POA: Diagnosis not present

## 2022-04-26 DIAGNOSIS — R931 Abnormal findings on diagnostic imaging of heart and coronary circulation: Secondary | ICD-10-CM

## 2022-04-26 DIAGNOSIS — I951 Orthostatic hypotension: Secondary | ICD-10-CM | POA: Diagnosis not present

## 2022-04-26 DIAGNOSIS — T466X5A Adverse effect of antihyperlipidemic and antiarteriosclerotic drugs, initial encounter: Secondary | ICD-10-CM

## 2022-04-26 NOTE — Patient Instructions (Addendum)
Medication Instructions:  STOP olmesartan   *If you need a refill on your cardiac medications before your next appointment, please call your pharmacy*    Follow-Up: At [facility], you and your health needs are our priority.  As part of our continuing mission to provide you with exceptional heart care, we have created designated Provider Care Teams.  These Care Teams include your primary Cardiologist (physician) and Advanced Practice Providers (APPs -  Physician Assistants and Nurse Practitioners) who all work together to provide you with the care you need, when you need it.  We recommend signing up for the patient portal called "MyChart".  Sign up information is provided on this After Visit Summary.  MyChart is used to connect with patients for Virtual Visits (Telemedicine).  Patients are able to view lab/test results, encounter notes, upcoming appointments, etc.  Non-urgent messages can be sent to your provider as well.   To learn more about what you can do with MyChart, go to [email or web address].    Your next appointment:    March 11 @ 10:55am with Raquel Sarna NP

## 2022-04-26 NOTE — Progress Notes (Signed)
OFFICE CONSULT NOTE  Chief Complaint:  Follow-up dyslipidemia, but also fatigue, upper back pain today, dizziness  Primary Care Physician: Cari Caraway, MD  HPI:  Bonnie Juarez is a 77 y.o. female who is being seen today for the evaluation of dyslipidemia at the request of Cari Caraway, MD. This is a pleasant 77 year old female patient is kindly referred to me for evaluation of dyslipidemia.  She has a strong family history of heart disease mostly in her mother who had 2 stents at age 40 and a stroke in her 67s.  There is also history of heart failure diabetes and possible heart attack in her siblings.  Personal medical problems include fibromyalgia and problems with her cervical spine which is been operated on, dyslipidemia, hypertension and history of melanoma.  She currently takes rosuvastatin 10 mg once weekly which is the most medicine she can tolerate.  In the past she is taken atorvastatin which caused significant myalgias as well as pravastatin which she tolerated however did not have significant improvement in her dyslipidemia.  She says this significantly affects her fibromyalgia.  Based on these numbers are most recent lipid profile in August showed total cholesterol 250, triglycerides 103, HDL 76 and LDL 154.  Although she has no known coronary disease, given her age, hypertension and family history of coronary disease, her goal LDL is likely less than 100.  At this point she is unlikely to reach that with additional therapies other than likely a PCSK9 inhibitor.  She reports being asymptomatic as far as no chest pain or worsening shortness of breath.  03/18/2018  Bonnie Juarez is seen today in follow-up.  She is doing very well.  She had spinal surgery in December.  She was having symptoms of sciatica in both legs which is improved significantly.  She is exercising more regularly and has less generalized pain.  She is tolerating the Repatha without any the side effects that she had  previously on statins.  Fortunately she has had marked reduction in her lipid profile.  Total cholesterol 4 days ago was 153, triglycerides 101, HDL 73 and LDL of 60.  This is a marked improvement in her lipid profile will certainly reduce her risk.  She did have a coronary calcium score in September which showed low calcium score of 80, however there was scattered coronary calcifications and moderate diffuse calcifications of the aortic root and descending aorta.  03/05/2019  Bonnie Juarez is seen today in annual follow-up.  Overall she reports doing really well.  Unfortunately she was not able to get her Repatha reauthorized.  Is not clear why this was denied and recently she tried to pick up more medications but was told that she needed new reauthorization.  Previously however it was reported that she did not need prior authorization.  We will look into this further.  She is also concerned about cost of medications and I advised her to consider the [facility].  We will provide her with an additional sample of Repatha today as she took her last dose 2 weeks ago and has no further doses.  Anticipate will take at least a couple weeks to do the paperwork.  Her most recent lipid profile was in October 2020 which showed total cholesterol 181, HDL 86, LDL 76 and triglycerides 107.  03/15/2021  Bonnie Juarez is seen today for follow-up.  Overall she is doing well.  Her cholesterol has come down even further with total now 175, triglycerides 95, HDL 78  and LDL 80.  She is also lost weight recently somewhat unintentionally as she has had issues with gastroparesis and had 1 or 2 other abdominal surgeries over the past year.  She reports compliance with Repatha and denies any side effects with that.  04/26/2022  Bonnie Juarez returns today for follow-up.  This was a routine visit for follow-up of her lipids which appear to be well-controlled.  Total cholesterol 165, triglycerides 65, HDL 87 and LDL 65.  Moreover  though she was seen recently by Cleotis Nipper, NP for evaluation of dizziness.  She underwent carotid Dopplers which showed some mild carotid artery narrowing on the right but no explanation for her syncope.  Cardiac monitoring interpreted by myself showed sowed's of PSVT but none lasting more than 18 beats.  Finally, an echocardiogram showed some mild diastolic dysfunction but normal LV function.  She is continue to have issues with some dizziness.  On presentation today her blood pressure was quite low at 82/54 however repeat came up to 132/76.  She described an episode this morning where she felt flushed and weak and she described pain that radiated across her shoulder blades in her back.  I repeated an EKG which is nonischemic showing sinus bradycardia at 58.  Her orthostatics were checked and noted that her blood pressure dropped from 132/76 lying to 122/77 sitting, to 108/73 standing with an increase in heart rate from 60-68.  Overall this is consistent with orthostatic hypotension.  Recently her PCP had cut her blood pressure medicine dose in half.  She is still struggling with the recent loss of her husband who is a patient of mine to esophageal cancer.  PMHx:  Past Medical History:  Diagnosis Date   Anxiety    Arthritis    Cancer ([place])    Hx: of squamouscell carcinoma on Left shin basal cell on face; melanoma on right lower leg   Complication of anesthesia    Dec 05, 2003 deceased LOC and hypoxia overmedicating with Morphine and Phenergan; received Narcan X 2, overnight pulse ox   Depression    Fibromyalgia    Frequent urination    GERD (gastroesophageal reflux disease)    Hx: of   H/O hiatal hernia    PMH: of   Headache(784.0)    Hypertension    IBS (irritable bowel syndrome)    Hx: of   Melanoma ([place])    Right leg   Osteopenia    Hx: of per pt, pt. adds osteoporosis- DOS   Pneumonia    hx   PONV (postoperative nausea and vomiting)     Past Surgical History:  Procedure  Laterality Date   APPENDECTOMY     ARCUATE KERATECTOMY     BREAST LUMPECTOMY Right [place]   FRACTURE SURGERY Right 2014   humerus   HERNIA REPAIR     hiatal   KNEE ARTHROSCOPY Right 2015   NICEN FUNDO PLACATION  [id number]   NISSEN FUNDOPLICATION N/A [id number]   Procedure: EXPLORATION OF FORGUT WITH ENDOSCOPY AND HITAL HERNIA CLOSURE, UPPER ENDOSCOPY;  Surgeon: Johnathan Hausen, MD;  Location: [facility];  Service: General;  Laterality: N/A;   ORIF HUMERUS FRACTURE Right 01/08/2013   Procedure: OPEN REDUCTION INTERNAL FIXATION (ORIF) PROXIMAL HUMERUS FRACTURE;  Surgeon: Marybelle Killings, MD;  Location: [place];  Service: Orthopedics;  Laterality: Right;  Open Reduction Internal Fixation Right Proximal Humerus Fracture   POSTERIOR CERVICAL FUSION/FORAMINOTOMY N/A 12/20/2016   Procedure: Posterior Cervical Fusion with lateral mass  fixation - Cervical Three-Thoracic Two;  Surgeon: Eustace Moore, MD;  Location: [place];  Service: Neurosurgery;  Laterality: N/A;   RADIAL HEAD REPLACEMENT  2005   trigger thrumb release Left 2001   tubular plasity  1970   XI ROBOTIC ASSISTED HIATAL HERNIA REPAIR N/A 12/06/2020   Procedure: XI ROBOTIC ASSISTED REDO HIATAL HERNIA REPAIR;  Surgeon: Johnathan Hausen, MD;  [facility]: [facility];  Service: General;  Laterality: N/A;    FAMHx:  Family History  Problem Relation Age of Onset   Heart disease Other    Lung disease Other    Breast cancer Neg Hx     SOCHx:   reports that she quit smoking about 41 years ago. Her smoking use included cigarettes. She has a 15.00 pack-year smoking history. She has never used smokeless tobacco. She reports current alcohol use of about 7.0 - 10.0 standard drinks of alcohol per week. She reports that she does not use drugs.  ALLERGIES:  Allergies  Allergen Reactions   Boniva [Ibandronic Acid] Other (See Comments)    Bone Pain   Dilaudid [Hydromorphone Hcl] Other (See Comments)    Hallucinations   Risedronate Sodium  Other (See Comments)    Bone pain   Statins Other (See Comments)    ROS: Pertinent items noted in HPI and remainder of comprehensive ROS otherwise negative.  HOME MEDS: Current Outpatient Medications on File Prior to Visit  Medication Sig Dispense Refill   acetaminophen (TYLENOL) 500 MG tablet Take 500-1,000 mg by mouth every 6 (six) hours as needed for moderate pain.     Ascorbic Acid (VITAMIN C) 1000 MG tablet Take 1,000 mg by mouth daily.     cholecalciferol (VITAMIN D3) 25 MCG (1000 UNIT) tablet Take 1,000 Units by mouth every other day.     Coenzyme Q10 (COQ10) 200 MG CAPS Take 200 mg by mouth daily.     cromolyn (NASALCROM) 5.2 MG/ACT nasal spray Place 1 spray into both nostrils 2 (two) times daily as needed for allergies.     denosumab (PROLIA) 60 MG/ML SOSY injection Inject 60 mg into the skin every 6 (six) months.     escitalopram (LEXAPRO) 20 MG tablet Take 40 mg by mouth at bedtime.  2   Evolocumab (REPATHA SURECLICK) XX123456 MG/ML SOAJ Inject 140 mg into the skin every 14 (fourteen) days. 2 mL 11   loratadine (CLARITIN) 10 MG tablet Take 10 mg by mouth daily.     olmesartan (BENICAR) 20 MG tablet Take 10 mg by mouth daily.     Polyethyl Glycol-Propyl Glycol (SYSTANE) 0.4-0.3 % SOLN Place 1-2 drops into both eyes 3 (three) times daily as needed (dry/irritated eyes).     RABEprazole (ACIPHEX) 20 MG tablet Take 20 mg by mouth daily.     traZODone (DESYREL) 100 MG tablet Take 100 mg by mouth at bedtime.   2   vitamin B-12 (CYANOCOBALAMIN) 1000 MCG tablet Take 1,000 mcg by mouth every other day.     No current facility-administered medications on file prior to visit.    LABS/IMAGING: No results found for this or any previous visit (from the past 48 hour(s)). No results found.  LIPID PANEL:    Component Value Date/Time   CHOL 165 03/20/2022 1002   TRIG 65 03/20/2022 1002   HDL 87 03/20/2022 1002   CHOLHDL 1.9 03/20/2022 1002   LDLCALC 65 03/20/2022 1002    WEIGHTS: Wt  Readings from Last 3 Encounters:  04/26/22 145 lb 9.6 oz (66 kg)  03/02/22 147 lb (66.7 kg)  08/04/21 160 lb 3.2 oz (72.7 kg)    VITALS: BP (!) 82/54 (BP Location: Left Arm, Patient Position: Sitting, Cuff Size: Normal)   Pulse 66   Ht '5\' 6"'$  (1.676 m)   Wt 145 lb 9.6 oz (66 kg)   SpO2 97%   BMI 23.50 kg/m   EXAM: General appearance: alert and no distress Lungs: clear to auscultation bilaterally Heart: regular rate and rhythm, S1, S2 normal, no murmur, click, rub or gallop Extremities: extremities normal, atraumatic, no cyanosis or edema Neurologic: Grossly normal  EKG: Sinus bradycardia at 58-personally reviewed  ASSESSMENT: Mixed dyslipidemia  Mild coronary artery calcification-CAC 80 (10/2017) Strong family history of coronary disease Hypertension Statin intolerance-on Repatha  PLAN: 1.   Bonnie Juarez had episode this morning of discomfort that radiated across her back as well as feeling dizzy and lightheaded.  She is orthostatic positive today and her initial blood pressure was quite low however did come up.  That being said I would advise stopping her blood pressure medication completely.  She should work on more adequate hydration.  Her EKG was largely reassuring.  Cholesterol has been well-controlled.  Recent additional tests were unremarkable except for some episodes of SVT.  She does not clearly note that her heart is racing but this could be responsible for some intermittent hypotension if she is in sustained arrhythmia.   Follow-up in a few months off bp meds - she is advised to monitor BP at home.  Pixie Casino, MD, [facility], [place] Director of the Advanced Lipid Disorders &  Cardiovascular Risk Reduction Clinic Diplomate of the American Board of Clinical Lipidology Attending Cardiologist  Direct Dial: [phone]  Fax: [phone]  Website:  [email or web address] 04/26/2022, 9:57 AM

## 2022-04-27 ENCOUNTER — Ambulatory Visit: Payer: PPO | Admitting: Physician Assistant

## 2022-05-02 DIAGNOSIS — R3989 Other symptoms and signs involving the genitourinary system: Secondary | ICD-10-CM | POA: Diagnosis not present

## 2022-05-03 DIAGNOSIS — I10 Essential (primary) hypertension: Secondary | ICD-10-CM | POA: Diagnosis not present

## 2022-05-03 DIAGNOSIS — E782 Mixed hyperlipidemia: Secondary | ICD-10-CM | POA: Diagnosis not present

## 2022-05-03 DIAGNOSIS — M810 Age-related osteoporosis without current pathological fracture: Secondary | ICD-10-CM | POA: Diagnosis not present

## 2022-05-03 DIAGNOSIS — F341 Dysthymic disorder: Secondary | ICD-10-CM | POA: Diagnosis not present

## 2022-05-03 DIAGNOSIS — K219 Gastro-esophageal reflux disease without esophagitis: Secondary | ICD-10-CM | POA: Diagnosis not present

## 2022-05-08 ENCOUNTER — Encounter: Payer: Self-pay | Admitting: Nurse Practitioner

## 2022-05-08 ENCOUNTER — Ambulatory Visit: Payer: PPO | Attending: Nurse Practitioner | Admitting: Nurse Practitioner

## 2022-05-08 VITALS — BP 132/64 | HR 53 | Ht 66.0 in | Wt 143.8 lb

## 2022-05-08 DIAGNOSIS — R931 Abnormal findings on diagnostic imaging of heart and coronary circulation: Secondary | ICD-10-CM | POA: Diagnosis not present

## 2022-05-08 DIAGNOSIS — E782 Mixed hyperlipidemia: Secondary | ICD-10-CM | POA: Diagnosis not present

## 2022-05-08 DIAGNOSIS — I10 Essential (primary) hypertension: Secondary | ICD-10-CM

## 2022-05-08 DIAGNOSIS — I951 Orthostatic hypotension: Secondary | ICD-10-CM | POA: Diagnosis not present

## 2022-05-08 DIAGNOSIS — Z87898 Personal history of other specified conditions: Secondary | ICD-10-CM | POA: Diagnosis not present

## 2022-05-08 NOTE — Progress Notes (Signed)
Office Visit    Patient Name: Bonnie Juarez Date of Encounter: 05/08/2022  Primary Care Provider:  Cari Caraway, MD Primary Cardiologist:  Pixie Casino, MD  Chief Complaint    77 year old female with a history of syncope, mild coronary artery calcification, hypertension, orthostatic hypotension, mixed dyslipidemia with statin intolerance, anxiety, depression, and fibromyalgia.  Past Medical History    Past Medical History:  Diagnosis Date   Anxiety    Arthritis    Cancer ([place])    Hx: of squamouscell carcinoma on Left shin basal cell on face; melanoma on right lower leg   Complication of anesthesia    11-28-2003 deceased LOC and hypoxia overmedicating with Morphine and Phenergan; received Narcan X 2, overnight pulse ox   Depression    Fibromyalgia    Frequent urination    GERD (gastroesophageal reflux disease)    Hx: of   H/O hiatal hernia    PMH: of   Headache(784.0)    Hypertension    IBS (irritable bowel syndrome)    Hx: of   Melanoma ([place])    Right leg   Osteopenia    Hx: of per pt, pt. adds osteoporosis- DOS   Pneumonia    hx   PONV (postoperative nausea and vomiting)    Past Surgical History:  Procedure Laterality Date   APPENDECTOMY     ARCUATE KERATECTOMY     BREAST LUMPECTOMY Right [place]   FRACTURE SURGERY Right 2014   humerus   HERNIA REPAIR     hiatal   KNEE ARTHROSCOPY Right 2015   NICEN FUNDO PLACATION  [id number]   NISSEN FUNDOPLICATION N/A [id number]   Procedure: EXPLORATION OF FORGUT WITH ENDOSCOPY AND HITAL HERNIA CLOSURE, UPPER ENDOSCOPY;  Surgeon: Johnathan Hausen, MD;  [facility];  Service: General;  Laterality: N/A;   ORIF HUMERUS FRACTURE Right 01/08/2013   Procedure: OPEN REDUCTION INTERNAL FIXATION (ORIF) PROXIMAL HUMERUS FRACTURE;  Surgeon: Marybelle Killings, MD;  Location: [place];  Service: Orthopedics;  Laterality: Right;  Open Reduction Internal Fixation Right Proximal Humerus Fracture   POSTERIOR CERVICAL  FUSION/FORAMINOTOMY N/A 12/20/2016   Procedure: Posterior Cervical Fusion with lateral mass fixation - Cervical Three-Thoracic Two;  Surgeon: Eustace Moore, MD;  Location: [place];  Service: Neurosurgery;  Laterality: N/A;   RADIAL HEAD REPLACEMENT  2005   trigger thrumb release Left 2001   tubular plasity  1970   XI ROBOTIC ASSISTED HIATAL HERNIA REPAIR N/A 12/06/2020   Procedure: XI ROBOTIC ASSISTED REDO HIATAL HERNIA REPAIR;  Surgeon: Johnathan Hausen, MD;  [facility];  Service: General;  Laterality: N/A;    Allergies  Allergies  Allergen Reactions   Boniva [Ibandronic Acid] Other (See Comments)    Bone Pain   Dilaudid [Hydromorphone Hcl] Other (See Comments)    Hallucinations   Risedronate Sodium Other (See Comments)    Bone pain   Statins Other (See Comments)     Labs/Other Studies Reviewed    The following studies were reviewed today: Calcium scoring 11/21/2017: Coronary calcium score 70 (67th percentile), calcium noted proximal circumflex, proximal LAD, mid LAD.   Carotid ultrasound 02/2022: Summary:  Right Carotid: Velocities in the right ICA are consistent with a 1-39%  stenosis.   Left Carotid: There is no evidence of stenosis in the left ICA. The  extracranial vessels were near-normal with only minimal wall thickening  or plaque.  Vertebrals:  Bilateral vertebral arteries demonstrate antegrade flow.  Subclavians: Right subclavian  artery flow was disturbed. Normal flow hemodynamics were seen in the left subclavian artery.   [place] 03/2022:  Patch Wear Time:  13 days and 16 hours (2024-01-09T17:37:39-0500 to 2024-01-23T09:45:27-0500)   Patient had a min HR of 46 bpm, max HR of 167 bpm, and avg HR of 64 bpm. Predominant underlying rhythm was Sinus Rhythm. 32 Supraventricular Tachycardia runs occurred, the run with the fastest interval lasting 4 beats with a max rate of 167 bpm, the  longest lasting 18 beats with an avg rate of 92 bpm. Isolated SVEs were rare (<1.0%),  SVE Couplets were rare (<1.0%), and SVE Triplets were rare (<1.0%). Isolated VEs were rare (<1.0%, 335), VE Couplets were rare (<1.0%, 1), and VE Triplets were rare  (<1.0%, 1).    Monitor shows sinus rhythm with 32 episodes of PSVT, but only up to 18 beats. No afib or pauses.  Echo 03/2022: IMPRESSIONS    1. Left ventricular ejection fraction, by estimation, is 60 to 65%. The  left ventricle has normal function. The left ventricle has no regional  wall motion abnormalities. There is moderate asymmetric left ventricular  hypertrophy of the basal-septal  segment. Left ventricular diastolic parameters are consistent with Grade I  diastolic dysfunction (impaired relaxation).   2. Right ventricular systolic function is normal. The right ventricular  size is normal.   3. Left atrial size was mildly dilated.   4. Right atrial size was mildly dilated.   5. The mitral valve is normal in structure. Trivial mitral valve  regurgitation. No evidence of mitral stenosis.   6. The aortic valve is tricuspid. There is mild calcification of the  aortic valve. Aortic valve regurgitation is not visualized. Aortic valve  sclerosis/calcification is present, without any evidence of aortic  stenosis.   7. The inferior vena cava is normal in size with greater than 50%  respiratory variability, suggesting right atrial pressure of 3 mmHg.    Recent Labs: 03/02/2022: BUN 10; Creatinine, Ser 0.91; Hemoglobin 12.5; Platelets 225; Potassium 4.8; Sodium 140; TSH 1.850  Recent Lipid Panel    Component Value Date/Time   CHOL 165 03/20/2022 1002   TRIG 65 03/20/2022 1002   HDL 87 03/20/2022 1002   CHOLHDL 1.9 03/20/2022 1002   LDLCALC 65 03/20/2022 1002    History of Present Illness    77 year old female with the above past medical history including syncope, mild coronary artery calcification, hypertension,  orthostatic hypotension, mixed dyslipidemia with statin intolerance, anxiety, depression, and  fibromyalgia.   Echocardiogram in 2014 showed normal LV function, no significant valvular abnormalities.  She has a strong history of CAD.  Coronary calcium score in 2019 was 80 (59th percentile).  She has followed with Dr. Debara Pickett for management of dyslipidemia with statin intolerance, on Repatha.  She had a syncopal episode in 01/2023.  Carotid ultrasound revealed 1 to 39% R ICA stenosis.  Cardiac monitor showed episodes of PSVT, longest lasting 18 beats.  Echocardiogram showed mild diastolic dysfunction, normal LV function.  She was last seen in the office on 04/26/2022 and continued her episodes of dizziness.  She was positive for orthostatic hypotension. Her BP medication was discontinued.    She presents today for follow-up.  Since her last visit she has done well from a cardiac standpoint.  Her BP has been stable. She is orthostatic positive in office today. She denies any recurrent dizziness, syncope, or pre-syncope, denies symptoms concerning for angina. She is still very much grieving the recent loss of her husband.  Otherwise, she reports feeling well.  Home Medications    Current Outpatient Medications  Medication Sig Dispense Refill   acetaminophen (TYLENOL) 500 MG tablet Take 500-1,000 mg by mouth every 6 (six) hours as needed for moderate pain.     Ascorbic Acid (VITAMIN C) 1000 MG tablet Take 1,000 mg by mouth daily.     cholecalciferol (VITAMIN D3) 25 MCG (1000 UNIT) tablet Take 1,000 Units by mouth every other day.     Coenzyme Q10 (COQ10) 200 MG CAPS Take 200 mg by mouth daily.     cromolyn (NASALCROM) 5.2 MG/ACT nasal spray Place 1 spray into both nostrils 2 (two) times daily as needed for allergies.     denosumab (PROLIA) 60 MG/ML SOSY injection Inject 60 mg into the skin every 6 (six) months.     escitalopram (LEXAPRO) 20 MG tablet Take 40 mg by mouth at bedtime.  2   Evolocumab (REPATHA SURECLICK) XX123456 MG/ML SOAJ Inject 140 mg into the skin every 14 (fourteen) days. 2 mL 11    loratadine (CLARITIN) 10 MG tablet Take 10 mg by mouth daily.     Polyethyl Glycol-Propyl Glycol (SYSTANE) 0.4-0.3 % SOLN Place 1-2 drops into both eyes 3 (three) times daily as needed (dry/irritated eyes).     RABEprazole (ACIPHEX) 20 MG tablet Take 20 mg by mouth daily.     traZODone (DESYREL) 100 MG tablet Take 100 mg by mouth at bedtime.   2   vitamin B-12 (CYANOCOBALAMIN) 1000 MCG tablet Take 1,000 mcg by mouth every other day.     No current facility-administered medications for this visit.     Review of Systems    She denies chest pain, palpitations, dyspnea, pnd, orthopnea, n, v, dizziness, syncope, edema, weight gain, or early satiety. All other systems reviewed and are otherwise negative except as noted above.   Physical Exam    VS:  BP 132/64   Pulse (!) 53   Ht '5\' 6"'$  (1.676 m)   Wt 143 lb 12.8 oz (65.2 kg)   SpO2 97%   BMI 23.21 kg/m   GEN: Well nourished, well developed, in no acute distress. HEENT: normal. Neck: Supple, no JVD, carotid bruits, or masses. Cardiac: RRR, no murmurs, rubs, or gallops. No clubbing, cyanosis, edema.  Radials/DP/PT 2+ and equal bilaterally.  Respiratory:  Respirations regular and unlabored, clear to auscultation bilaterally. GI: Soft, nontender, nondistended, BS + x 4. MS: no deformity or atrophy. Skin: warm and dry, no rash. Neuro:  Strength and sensation are intact. Psych: Normal affect.  Accessory Clinical Findings    ECG personally reviewed by me today - No EKG in office today.   Lab Results  Component Value Date   WBC 7.3 03/02/2022   HGB 12.5 03/02/2022   HCT 37.3 03/02/2022   MCV 87 03/02/2022   PLT 225 03/02/2022   Lab Results  Component Value Date   CREATININE 0.91 03/02/2022   BUN 10 03/02/2022   NA 140 03/02/2022   K 4.8 03/02/2022   CL 102 03/02/2022   CO2 20 03/02/2022   Lab Results  Component Value Date   ALT 15 01/06/2013   AST 18 01/06/2013   ALKPHOS 68 01/06/2013   BILITOT 0.6 01/06/2013   Lab  Results  Component Value Date   CHOL 165 03/20/2022   HDL 87 03/20/2022   LDLCALC 65 03/20/2022   TRIG 65 03/20/2022   CHOLHDL 1.9 03/20/2022    No results found for: "HGBA1C"  Assessment & Plan  1. Presyncope/syncope/Orthostatic hypotension:  Carotid ultrasound following syncopal episode revealed 1 to 39% R ICA stenosis.  Cardiac monitor showed episodes of PSVT, longest lasting 18 beats.  Echo showed mild diastolic dysfunction, normal LV function. BP meds recently discontinued in the setting of syncope/pre-syncope, orthostatic hypotension. BP has been stable. She remains somewhat orthostatic, denies any recurrent dizziness, presyncope, syncope.  Encouraged gradual position changes, adequate hydration.  2. Coronary artery calcification: Coronary calcium score in 2019 was 80 (59th percentile).  Stable with no anginal symptoms. No indication for ischemic evaluation.  Continue Repatha.  3. Hypertension: BP stable off meds.  Advised her to continue to monitor BP and report BP consistently greater than 150/80 (will allow for some permissive hypertension in the setting of symptomatic orthostatic hypotension).   4. Mixed dyslipidemia: LDL was 65 in 02/2022.  Continue Repatha.  5. Disposition: Follow-up in 3 months with APP, 6 months with Dr. Debara Pickett.      Lenna Sciara, NP 05/08/2022, 8:49 PM

## 2022-05-08 NOTE — Patient Instructions (Signed)
Medication Instructions:  Your physician recommends that you continue on your current medications as directed. Please refer to the Current Medication list given to you today.   *If you need a refill on your cardiac medications before your next appointment, please call your pharmacy*   Lab Work: NONE ordered at this time of appointment   If you have labs (blood work) drawn today and your tests are completely normal, you will receive your results only by: [place] (if you have MyChart) OR A paper copy in the mail If you have any lab test that is abnormal or we need to change your treatment, we will call you to review the results.   Testing/Procedures: NONE ordered at this time of appointment     Follow-Up: At [facility], you and your health needs are our priority.  As part of our continuing mission to provide you with exceptional heart care, we have created designated Provider Care Teams.  These Care Teams include your primary Cardiologist (physician) and Advanced Practice Providers (APPs -  Physician Assistants and Nurse Practitioners) who all work together to provide you with the care you need, when you need it.  We recommend signing up for the patient portal called "MyChart".  Sign up information is provided on this After Visit Summary.  MyChart is used to connect with patients for Virtual Visits (Telemedicine).  Patients are able to view lab/test results, encounter notes, upcoming appointments, etc.  Non-urgent messages can be sent to your provider as well.   To learn more about what you can do with MyChart, go to [email or web address].    Your next appointment:   3 month(s) 6-8 month (Dr. Debara Pickett) Provider:   Diona Browner, NP        Other Instructions

## 2022-05-09 DIAGNOSIS — Z634 Disappearance and death of family member: Secondary | ICD-10-CM | POA: Diagnosis not present

## 2022-05-09 DIAGNOSIS — I10 Essential (primary) hypertension: Secondary | ICD-10-CM | POA: Diagnosis not present

## 2022-05-09 DIAGNOSIS — E782 Mixed hyperlipidemia: Secondary | ICD-10-CM | POA: Diagnosis not present

## 2022-05-09 DIAGNOSIS — K219 Gastro-esophageal reflux disease without esophagitis: Secondary | ICD-10-CM | POA: Diagnosis not present

## 2022-05-09 DIAGNOSIS — M810 Age-related osteoporosis without current pathological fracture: Secondary | ICD-10-CM | POA: Diagnosis not present

## 2022-05-15 ENCOUNTER — Other Ambulatory Visit: Payer: PPO

## 2022-05-15 DIAGNOSIS — M797 Fibromyalgia: Secondary | ICD-10-CM | POA: Diagnosis not present

## 2022-05-15 DIAGNOSIS — Z6823 Body mass index (BMI) 23.0-23.9, adult: Secondary | ICD-10-CM | POA: Diagnosis not present

## 2022-05-15 DIAGNOSIS — Z79899 Other long term (current) drug therapy: Secondary | ICD-10-CM | POA: Diagnosis not present

## 2022-05-15 DIAGNOSIS — I700 Atherosclerosis of aorta: Secondary | ICD-10-CM | POA: Diagnosis not present

## 2022-05-15 DIAGNOSIS — R350 Frequency of micturition: Secondary | ICD-10-CM | POA: Diagnosis not present

## 2022-05-15 DIAGNOSIS — E782 Mixed hyperlipidemia: Secondary | ICD-10-CM | POA: Diagnosis not present

## 2022-05-15 DIAGNOSIS — J309 Allergic rhinitis, unspecified: Secondary | ICD-10-CM | POA: Diagnosis not present

## 2022-05-15 DIAGNOSIS — F3341 Major depressive disorder, recurrent, in partial remission: Secondary | ICD-10-CM | POA: Diagnosis not present

## 2022-05-15 DIAGNOSIS — K219 Gastro-esophageal reflux disease without esophagitis: Secondary | ICD-10-CM | POA: Diagnosis not present

## 2022-05-15 DIAGNOSIS — I10 Essential (primary) hypertension: Secondary | ICD-10-CM | POA: Diagnosis not present

## 2022-05-15 DIAGNOSIS — G479 Sleep disorder, unspecified: Secondary | ICD-10-CM | POA: Diagnosis not present

## 2022-05-15 DIAGNOSIS — M810 Age-related osteoporosis without current pathological fracture: Secondary | ICD-10-CM | POA: Diagnosis not present

## 2022-06-07 DIAGNOSIS — F4310 Post-traumatic stress disorder, unspecified: Secondary | ICD-10-CM | POA: Diagnosis not present

## 2022-06-07 DIAGNOSIS — F411 Generalized anxiety disorder: Secondary | ICD-10-CM | POA: Diagnosis not present

## 2022-07-17 DIAGNOSIS — H5213 Myopia, bilateral: Secondary | ICD-10-CM | POA: Diagnosis not present

## 2022-07-17 DIAGNOSIS — H04123 Dry eye syndrome of bilateral lacrimal glands: Secondary | ICD-10-CM | POA: Diagnosis not present

## 2022-07-17 DIAGNOSIS — H524 Presbyopia: Secondary | ICD-10-CM | POA: Diagnosis not present

## 2022-07-17 DIAGNOSIS — Z961 Presence of intraocular lens: Secondary | ICD-10-CM | POA: Diagnosis not present

## 2022-07-19 DIAGNOSIS — F4310 Post-traumatic stress disorder, unspecified: Secondary | ICD-10-CM | POA: Diagnosis not present

## 2022-07-19 DIAGNOSIS — F411 Generalized anxiety disorder: Secondary | ICD-10-CM | POA: Diagnosis not present

## 2022-08-07 NOTE — Progress Notes (Unsigned)
Office Visit    Patient Name: Bonnie Juarez Date of Encounter: 08/08/2022  Primary Care Provider:  Gweneth Dimitri, MD Primary Cardiologist:  Chrystie Nose, MD  Chief Complaint    77 year old female with a history of syncope, mild coronary artery calcification, hypertension, orthostatic hypotension, mixed dyslipidemia with statin intolerance, anxiety, depression, and fibromyalgia who presents for follow-up related to hypertension.   Past Medical History    Past Medical History:  Diagnosis Date   Anxiety    Arthritis    Cancer (HCC)    Hx: of squamouscell carcinoma on Left shin basal cell on face; melanoma on right lower leg   Complication of anesthesia    12/16/03 deceased LOC and hypoxia overmedicating with Morphine and Phenergan; received Narcan X 2, overnight pulse ox   Depression    Fibromyalgia    Frequent urination    GERD (gastroesophageal reflux disease)    Hx: of   H/O hiatal hernia    PMH: of   Headache(784.0)    Hypertension    IBS (irritable bowel syndrome)    Hx: of   Melanoma (HCC)    Right leg   Osteopenia    Hx: of per pt, pt. adds osteoporosis- DOS   Pneumonia    hx   PONV (postoperative nausea and vomiting)    Past Surgical History:  Procedure Laterality Date   APPENDECTOMY     ARCUATE KERATECTOMY     BREAST LUMPECTOMY Right 1991   CERVICAL FUSION  1983   FRACTURE SURGERY Right 2014   humerus   HERNIA REPAIR     hiatal   KNEE ARTHROSCOPY Right 2015   NICEN FUNDO PLACATION  2010   NISSEN FUNDOPLICATION N/A 04/13/2020   Procedure: EXPLORATION OF FORGUT WITH ENDOSCOPY AND HITAL HERNIA CLOSURE, UPPER ENDOSCOPY;  Surgeon: Luretha Murphy, MD;  Location: [facility];  Service: General;  Laterality: N/A;   ORIF HUMERUS FRACTURE Right 01/08/2013   Procedure: OPEN REDUCTION INTERNAL FIXATION (ORIF) PROXIMAL HUMERUS FRACTURE;  Surgeon: Eldred Manges, MD;  Location: [facility] OR;  Service: Orthopedics;  Laterality: Right;  Open Reduction Internal Fixation Right  Proximal Humerus Fracture   POSTERIOR CERVICAL FUSION/FORAMINOTOMY N/A 12/20/2016   Procedure: Posterior Cervical Fusion with lateral mass fixation - Cervical Three-Thoracic Two;  Surgeon: Tia Alert, MD;  [facility]: [facility];  Service: Neurosurgery;  Laterality: N/A;   RADIAL HEAD REPLACEMENT  2005   trigger thrumb release Left 2001   tubular plasity  1970   XI ROBOTIC ASSISTED HIATAL HERNIA REPAIR N/A 12/06/2020   Procedure: XI ROBOTIC ASSISTED REDO HIATAL HERNIA REPAIR;  Surgeon: Luretha Murphy, MD;  Location: [facility];  Service: General;  Laterality: N/A;    Allergies  Allergies  Allergen Reactions   Boniva [Ibandronic Acid] Other (See Comments)    Bone Pain   Dilaudid [Hydromorphone Hcl] Other (See Comments)    Hallucinations   Risedronate Sodium Other (See Comments)    Bone pain   Statins Other (See Comments)     Labs/Other Studies Reviewed    The following studies were reviewed today:  Cardiac Studies & Procedures     STRESS TESTS  MYOCARDIAL PERFUSION IMAGING 11/29/2017   ECHOCARDIOGRAM  ECHOCARDIOGRAM COMPLETE 04/18/2022  Narrative ECHOCARDIOGRAM REPORT    Patient Name:   Bonnie Juarez  Date of Exam: 04/18/2022 Medical Rec #:  [id number]     Height:       66.0 in Accession #:    [id number]    Weight:  147.0 lb Date of Birth:  [birth year]     BSA:          1.755 m Patient Age:    52 years      BP:           124/56 mmHg Patient Gender: F             HR:           71 bpm. Exam Location:  [address]  Procedure: 2D Echo, Cardiac Doppler and Color Doppler  Indications:    R55 Syncope  History:        Patient has prior history of Echocardiogram examinations, most recent 10/30/2012. Risk Factors:Hypertension.  Sonographer:    Daphine Deutscher RDCS Referring Phys: [id number] [facility]  IMPRESSIONS   1. Left ventricular ejection fraction, by estimation, is 60 to 65%. The left ventricle has normal function. The left ventricle has no regional  wall motion abnormalities. There is moderate asymmetric left ventricular hypertrophy of the basal-septal segment. Left ventricular diastolic parameters are consistent with Grade I diastolic dysfunction (impaired relaxation). 2. Right ventricular systolic function is normal. The right ventricular size is normal. 3. Left atrial size was mildly dilated. 4. Right atrial size was mildly dilated. 5. The mitral valve is normal in structure. Trivial mitral valve regurgitation. No evidence of mitral stenosis. 6. The aortic valve is tricuspid. There is mild calcification of the aortic valve. Aortic valve regurgitation is not visualized. Aortic valve sclerosis/calcification is present, without any evidence of aortic stenosis. 7. The inferior vena cava is normal in size with greater than 50% respiratory variability, suggesting right atrial pressure of 3 mmHg.  FINDINGS Left Ventricle: Left ventricular ejection fraction, by estimation, is 60 to 65%. The left ventricle has normal function. The left ventricle has no regional wall motion abnormalities. The left ventricular internal cavity size was normal in size. There is moderate asymmetric left ventricular hypertrophy of the basal-septal segment. Left ventricular diastolic parameters are consistent with Grade I diastolic dysfunction (impaired relaxation).  Right Ventricle: The right ventricular size is normal. No increase in right ventricular wall thickness. Right ventricular systolic function is normal.  Left Atrium: Left atrial size was mildly dilated.  Right Atrium: Right atrial size was mildly dilated.  Pericardium: There is no evidence of pericardial effusion.  Mitral Valve: The mitral valve is normal in structure. Trivial mitral valve regurgitation. No evidence of mitral valve stenosis.  Tricuspid Valve: The tricuspid valve is normal in structure. Tricuspid valve regurgitation is mild . No evidence of tricuspid stenosis.  Aortic Valve: The aortic  valve is tricuspid. There is mild calcification of the aortic valve. Aortic valve regurgitation is not visualized. Aortic valve sclerosis/calcification is present, without any evidence of aortic stenosis.  Pulmonic Valve: The pulmonic valve was normal in structure. Pulmonic valve regurgitation is mild. No evidence of pulmonic stenosis.  Aorta: The aortic root is normal in size and structure.  Venous: The inferior vena cava is normal in size with greater than 50% respiratory variability, suggesting right atrial pressure of 3 mmHg.  IAS/Shunts: No atrial level shunt detected by color flow Doppler.   LEFT VENTRICLE PLAX 2D LVIDd:         4.80 cm   Diastology LVIDs:         3.10 cm   LV e' medial:    6.25 cm/s LV PW:         0.80 cm   LV E/e' medial:  10.9 LV IVS:  0.70 cm   LV e' lateral:   9.30 cm/s LVOT diam:     2.10 cm   LV E/e' lateral: 7.4 LV SV:         98 LV SV Index:   56 LVOT Area:     3.46 cm   RIGHT VENTRICLE             IVC RV Basal diam:  3.70 cm     IVC diam: 1.60 cm RV S prime:     10.75 cm/s TAPSE (M-mode): 1.8 cm  LEFT ATRIUM             Index        RIGHT ATRIUM           Index LA diam:        4.00 cm 2.28 cm/m   RA Area:     12.90 cm LA Vol (A2C):   31.1 ml 17.73 ml/m  RA Volume:   31.50 ml  17.95 ml/m LA Vol (A4C):   29.0 ml 16.53 ml/m LA Biplane Vol: 30.5 ml 17.38 ml/m AORTIC VALVE LVOT Vmax:   127.00 cm/s LVOT Vmean:  78.800 cm/s LVOT VTI:    0.282 m  AORTA Ao Root diam: 3.00 cm Ao Asc diam:  3.10 cm  MITRAL VALVE               TRICUSPID VALVE MV Area (PHT): 3.34 cm    TR Peak grad:   18.3 mmHg MV Decel Time: 227 msec    TR Vmax:        214.00 cm/s MV E velocity: 68.40 cm/s MV A velocity: 86.20 cm/s  SHUNTS MV E/A ratio:  0.79        Systemic VTI:  0.28 m Systemic Diam: 2.10 cm  Arvilla Meres MD Electronically signed by Arvilla Meres MD Signature Date/Time: 04/18/2022/2:15:17 PM    Final    MONITORS  LONG TERM  MONITOR-LIVE TELEMETRY (3-14 DAYS) 04/06/2022  Narrative Patch Wear Time:  13 days and 16 hours (2024-01-09T17:37:39-0500 to 2024-01-23T09:45:27-0500)  Patient had a min HR of 46 bpm, max HR of 167 bpm, and avg HR of 64 bpm. Predominant underlying rhythm was Sinus Rhythm. 32 Supraventricular Tachycardia runs occurred, the run with the fastest interval lasting 4 beats with a max rate of 167 bpm, the longest lasting 18 beats with an avg rate of 92 bpm. Isolated SVEs were rare (<1.0%), SVE Couplets were rare (<1.0%), and SVE Triplets were rare (<1.0%). Isolated VEs were rare (<1.0%, 335), VE Couplets were rare (<1.0%, 1), and VE Triplets were rare (<1.0%, 1).  Monitor shows sinus rhythm with 32 episodes of PSVT, but only up to 18 beats. No afib or pauses.  Chrystie Nose, MD, [facility], FACP [place]  [facility] Medical Director of the [facility] Lipid [facility] Cardiovascular Risk [facility] Diplomate of the American Board of Clinical Lipidology Attending Cardiologist Direct Dial: [phone]  Fax: [phone] Website:  [email or web address]   CT SCANS  CT CARDIAC SCORING (SELF PAY ONLY) 11/22/2017  Addendum 11/22/2017 12:37 PM ADDENDUM REPORT: 11/22/2017 12:35  CLINICAL DATA:  Risk stratification  EXAM: Coronary Calcium Score  TECHNIQUE: The patient was scanned on a [employer] scanner. Axial non-contrast 3 mm slices were carried out through the heart. The data set was analyzed on a dedicated work station and scored using the Agatson method.  FINDINGS: Non-cardiac: See separate report from [facility].  Ascending Aorta: Normal size, moderate diffuse calcifications in the aortic root  and descending aorta.  Pericardium: Normal.  Coronary arteries: Normal origin.  IMPRESSION: 1. Coronary calcium score of 80. This was 36 percentile for age and sex matched control.  2. Aorta has normal size with moderate diffuse calcifications in the aortic root and  descending aorta.   Electronically Signed By: Tobias Alexander On: 11/22/2017 12:35  Addendum 11/21/2017  5:15 PM ADDENDUM REPORT: 11/21/2017 17:13  CLINICAL DATA:  Risk stratification  EXAM: Coronary Calcium Score  TECHNIQUE: The patient was scanned on a Siemens Somatom 64 slice scanner. Axial non-contrast 3 mm slices were carried out through the heart. The data set was analyzed on a dedicated work station and scored using the Agatson method.  FINDINGS: Non-cardiac: See separate report from [facility].  Ascending Aorta: Normal diameter 3.4 cm  Pericardium: Normal  Coronary arteries: Calcium noted in proximal circumflex and LAD and mid LAD  IMPRESSION: Coronary calcium score of 70. This was 54 th percentile for age and sex matched control.  Charlton Haws   Electronically Signed By: Charlton Haws M.D. On: 11/21/2017 17:13  Narrative EXAM: OVER-READ INTERPRETATION  CT CHEST  The following report is an over-read performed by radiologist Dr. Trudie Reed of [facility] on 11/21/2017. This over-read does not include interpretation of cardiac or coronary anatomy or pathology. The coronary calcium score interpretation by the cardiologist is attached.  COMPARISON:  None.  FINDINGS: Aortic atherosclerosis. Within the visualized portions of the thorax there are no suspicious appearing pulmonary nodules or masses, there is no acute consolidative airspace disease, no pleural effusions, no pneumothorax and no lymphadenopathy. Moderate-sized hiatal hernia. Visualized portions of the upper abdomen are unremarkable. There are no aggressive appearing lytic or blastic lesions noted in the visualized portions of the skeleton.  IMPRESSION: 1. Moderate-sized hiatal hernia. 2.  Aortic Atherosclerosis (ICD10-I70.0).  Electronically Signed: By: Trudie Reed M.D. On: 11/21/2017 16:50         Recent Labs: 03/02/2022: BUN 10; Creatinine, Ser  0.91; Hemoglobin 12.5; Platelets 225; Potassium 4.8; Sodium 140; TSH 1.850  Recent Lipid Panel    Component Value Date/Time   CHOL 165 03/20/2022 1002   TRIG 65 03/20/2022 1002   HDL 87 03/20/2022 1002   CHOLHDL 1.9 03/20/2022 1002   LDLCALC 65 03/20/2022 1002    History of Present Illness    77 year old female with the above past medical history including syncope, mild coronary artery calcification, hypertension,  orthostatic hypotension, mixed dyslipidemia with statin intolerance, anxiety, depression, and fibromyalgia.   Echocardiogram in 2014 showed normal LV function, no significant valvular abnormalities. She has a strong history of CAD.  Coronary calcium score in 2019 was 80 (59th percentile). She has followed with Dr. Rennis Golden for management of dyslipidemia with statin intolerance, on Repatha.  She had a syncopal episode in 01/2023.  Carotid ultrasound revealed 1 to 39% R ICA stenosis.  Cardiac monitor showed episodes of PSVT, longest lasting 18 beats.  Echocardiogram showed mild diastolic dysfunction, normal LV function.  She was noted to be severely orthostatic.  All BP medications were discontinued.  She was last seen in the office on 05/08/2022 and was stable from a cardiac standpoint.  She did note occasional dizziness/lightheadedness, denied presyncope, syncope.  BP was stable off her medication.   She presents today for follow-up.  Since her last visit she has done well from a cardiac standpoint.  BP has been overall well-controlled.  She denies any further dizziness, presyncope, or syncope.  She continues to grieve the loss of  her husband, though overall she is coping very well and has remained active.  She notes she has a strong social support system.  Overall, she reports feeling well.  Home Medications    Current Outpatient Medications  Medication Sig Dispense Refill   acetaminophen (TYLENOL) 500 MG tablet Take 500-1,000 mg by mouth every 6 (six) hours as needed for moderate  pain.     Ascorbic Acid (VITAMIN C) 1000 MG tablet Take 1,000 mg by mouth daily.     cholecalciferol (VITAMIN D3) 25 MCG (1000 UNIT) tablet Take 1,000 Units by mouth every other day.     Coenzyme Q10 (COQ10) 200 MG CAPS Take 200 mg by mouth daily.     cromolyn (NASALCROM) 5.2 MG/ACT nasal spray Place 1 spray into both nostrils 2 (two) times daily as needed for allergies.     escitalopram (LEXAPRO) 20 MG tablet Take 40 mg by mouth at bedtime.  2   EVENITY 105 MG/1. SOSY injection Inject 210 mg into the skin every 30 (thirty) days.     Evolocumab (REPATHA SURECLICK) 140 MG/ML SOAJ Inject 140 mg into the skin every 14 (fourteen) days. 2 mL 11   loratadine (CLARITIN) 10 MG tablet Take 10 mg by mouth daily.     Polyethyl Glycol-Propyl Glycol (SYSTANE) 0.4-0.3 % SOLN Place 1-2 drops into both eyes 3 (three) times daily as needed (dry/irritated eyes).     RABEprazole (ACIPHEX) 20 MG tablet Take 20 mg by mouth daily.     traZODone (DESYREL) 100 MG tablet Take 100 mg by mouth at bedtime.   2   vitamin B-12 (CYANOCOBALAMIN) 1000 MCG tablet Take 1,000 mcg by mouth every other day.     denosumab (PROLIA) 60 MG/ML SOSY injection Inject 60 mg into the skin every 6 (six) months. (Patient not taking: Reported on 08/08/2022)     No current facility-administered medications for this visit.     Review of Systems    She denies chest pain, palpitations, dyspnea, pnd, orthopnea, n, v, dizziness, syncope, edema, weight gain, or early satiety. All other systems reviewed and are otherwise negative except as noted above.   Physical Exam    VS:  BP (!) 148/62 (BP Location: Left Arm, Patient Position: Sitting, Cuff Size: Normal)   Pulse (!) 57   Ht 5\' 6"  (1.676 m)   Wt 142 lb 12.8 oz (64.8 kg)   SpO2 97%   BMI 23.05 kg/m  GEN: Well nourished, well developed, in no acute distress. HEENT: normal. Neck: Supple, no JVD, carotid bruits, or masses. Cardiac: RRR, no murmurs, rubs, or gallops. No clubbing,  cyanosis, edema.  Radials/DP/PT 2+ and equal bilaterally.  Respiratory:  Respirations regular and unlabored, clear to auscultation bilaterally. GI: Soft, nontender, nondistended, BS + x 4. MS: no deformity or atrophy. Skin: warm and dry, no rash. Neuro:  Strength and sensation are intact. Psych: Normal affect.  Accessory Clinical Findings    ECG personally reviewed by me today - No EKG in office today.    Lab Results  Component Value Date   WBC 7.3 03/02/2022   HGB 12.5 03/02/2022   HCT 37.3 03/02/2022   MCV 87 03/02/2022   PLT 225 03/02/2022   Lab Results  Component Value Date   CREATININE 0.91 03/02/2022   BUN 10 03/02/2022   NA 140 03/02/2022   K 4.8 03/02/2022   CL 102 03/02/2022   CO2 20 03/02/2022   Lab Results  Component Value Date   ALT 15 01/06/2013  AST 18 01/06/2013   ALKPHOS 68 01/06/2013   BILITOT 0.6 01/06/2013   Lab Results  Component Value Date   CHOL 165 03/20/2022   HDL 87 03/20/2022   LDLCALC 65 03/20/2022   TRIG 65 03/20/2022   CHOLHDL 1.9 03/20/2022    No results found for: "HGBA1C"  Assessment & Plan    1. Presyncope/syncope/Orthostatic hypotension:  Carotid ultrasound following syncopal episode revealed 1 to 39% R ICA stenosis.  Cardiac monitor showed episodes of PSVT, longest lasting 18 beats.  Echo showed mild diastolic dysfunction, normal LV function. Syncope thought to have occurred in the setting of severe orthostatic hypotension. She denies any recurrent dizziness, presyncope, syncope.  BP has been stable off all BP meds.   2. Coronary artery calcification: Coronary calcium score in 2019 was 80 (59th percentile).  Stable with no anginal symptoms. No indication for ischemic evaluation.  Continue Repatha.  3. Hypertension: BP stable off meds.  Advised her to continue to monitor BP and report BP consistently greater than 150/80 (some permissive hypertension in the setting of symptomatic orthostatic hypotension).    4. Mixed  dyslipidemia: LDL was 65 in 02/2022.  Continue Repatha.   5. Disposition: Follow-up in 6 months.   Joylene Grapes, NP 08/08/2022, 1:39 PM

## 2022-08-08 ENCOUNTER — Ambulatory Visit: Payer: PPO | Attending: Nurse Practitioner | Admitting: Nurse Practitioner

## 2022-08-08 ENCOUNTER — Encounter: Payer: Self-pay | Admitting: Nurse Practitioner

## 2022-08-08 VITALS — BP 148/62 | HR 57 | Ht 66.0 in | Wt 142.8 lb

## 2022-08-08 DIAGNOSIS — I951 Orthostatic hypotension: Secondary | ICD-10-CM | POA: Diagnosis not present

## 2022-08-08 DIAGNOSIS — Z87898 Personal history of other specified conditions: Secondary | ICD-10-CM | POA: Diagnosis not present

## 2022-08-08 DIAGNOSIS — I10 Essential (primary) hypertension: Secondary | ICD-10-CM | POA: Diagnosis not present

## 2022-08-08 DIAGNOSIS — R931 Abnormal findings on diagnostic imaging of heart and coronary circulation: Secondary | ICD-10-CM

## 2022-08-08 DIAGNOSIS — E785 Hyperlipidemia, unspecified: Secondary | ICD-10-CM | POA: Diagnosis not present

## 2022-08-08 NOTE — Patient Instructions (Signed)
Medication Instructions:  Your physician recommends that you continue on your current medications as directed. Please refer to the Current Medication list given to you today.  *If you need a refill on your cardiac medications before your next appointment, please call your pharmacy*   Lab Work: NONE ordered at this time of appointment    Testing/Procedures: NONE ordered at this time of appointment     Follow-Up: At [facility], you and your health needs are our priority.  As part of our continuing mission to provide you with exceptional heart care, we have created designated Provider Care Teams.  These Care Teams include your primary Cardiologist (physician) and Advanced Practice Providers (APPs -  Physician Assistants and Nurse Practitioners) who all work together to provide you with the care you need, when you need it.  We recommend signing up for the patient portal called "MyChart".  Sign up information is provided on this After Visit Summary.  MyChart is used to connect with patients for Virtual Visits (Telemedicine).  Patients are able to view lab/test results, encounter notes, upcoming appointments, etc.  Non-urgent messages can be sent to your provider as well.   To learn more about what you can do with MyChart, go to [email or web address].    Your next appointment:   6 month(s)  Provider:   Bernadene Person, NP        Other Instructions

## 2022-08-09 DIAGNOSIS — L720 Epidermal cyst: Secondary | ICD-10-CM | POA: Diagnosis not present

## 2022-08-09 DIAGNOSIS — S6010XA Contusion of unspecified finger with damage to nail, initial encounter: Secondary | ICD-10-CM | POA: Diagnosis not present

## 2022-08-14 DIAGNOSIS — E782 Mixed hyperlipidemia: Secondary | ICD-10-CM | POA: Diagnosis not present

## 2022-08-14 DIAGNOSIS — E041 Nontoxic single thyroid nodule: Secondary | ICD-10-CM | POA: Diagnosis not present

## 2022-08-14 DIAGNOSIS — R7303 Prediabetes: Secondary | ICD-10-CM | POA: Diagnosis not present

## 2022-08-14 DIAGNOSIS — I10 Essential (primary) hypertension: Secondary | ICD-10-CM | POA: Diagnosis not present

## 2022-08-14 DIAGNOSIS — J069 Acute upper respiratory infection, unspecified: Secondary | ICD-10-CM | POA: Diagnosis not present

## 2022-08-14 DIAGNOSIS — R7309 Other abnormal glucose: Secondary | ICD-10-CM | POA: Diagnosis not present

## 2022-08-14 DIAGNOSIS — M810 Age-related osteoporosis without current pathological fracture: Secondary | ICD-10-CM | POA: Diagnosis not present

## 2022-08-17 DIAGNOSIS — R059 Cough, unspecified: Secondary | ICD-10-CM | POA: Diagnosis not present

## 2022-08-17 DIAGNOSIS — K3184 Gastroparesis: Secondary | ICD-10-CM | POA: Diagnosis not present

## 2022-08-17 DIAGNOSIS — R634 Abnormal weight loss: Secondary | ICD-10-CM | POA: Diagnosis not present

## 2022-08-21 DIAGNOSIS — R053 Chronic cough: Secondary | ICD-10-CM | POA: Diagnosis not present

## 2022-08-21 DIAGNOSIS — R0981 Nasal congestion: Secondary | ICD-10-CM | POA: Diagnosis not present

## 2022-08-21 DIAGNOSIS — R0982 Postnasal drip: Secondary | ICD-10-CM | POA: Diagnosis not present

## 2022-09-01 DIAGNOSIS — R059 Cough, unspecified: Secondary | ICD-10-CM | POA: Diagnosis not present

## 2022-09-01 DIAGNOSIS — R634 Abnormal weight loss: Secondary | ICD-10-CM | POA: Diagnosis not present

## 2022-10-18 DIAGNOSIS — F411 Generalized anxiety disorder: Secondary | ICD-10-CM | POA: Diagnosis not present

## 2022-10-18 DIAGNOSIS — F4310 Post-traumatic stress disorder, unspecified: Secondary | ICD-10-CM | POA: Diagnosis not present

## 2022-11-10 DIAGNOSIS — E782 Mixed hyperlipidemia: Secondary | ICD-10-CM | POA: Diagnosis not present

## 2022-11-10 DIAGNOSIS — Z0000 Encounter for general adult medical examination without abnormal findings: Secondary | ICD-10-CM | POA: Diagnosis not present

## 2022-11-10 DIAGNOSIS — I10 Essential (primary) hypertension: Secondary | ICD-10-CM | POA: Diagnosis not present

## 2022-11-10 DIAGNOSIS — Z6822 Body mass index (BMI) 22.0-22.9, adult: Secondary | ICD-10-CM | POA: Diagnosis not present

## 2022-11-10 DIAGNOSIS — M810 Age-related osteoporosis without current pathological fracture: Secondary | ICD-10-CM | POA: Diagnosis not present

## 2022-11-10 DIAGNOSIS — Z1389 Encounter for screening for other disorder: Secondary | ICD-10-CM | POA: Diagnosis not present

## 2022-11-10 DIAGNOSIS — Z79899 Other long term (current) drug therapy: Secondary | ICD-10-CM | POA: Diagnosis not present

## 2022-11-15 DIAGNOSIS — J309 Allergic rhinitis, unspecified: Secondary | ICD-10-CM | POA: Diagnosis not present

## 2022-11-15 DIAGNOSIS — I10 Essential (primary) hypertension: Secondary | ICD-10-CM | POA: Diagnosis not present

## 2022-11-15 DIAGNOSIS — I700 Atherosclerosis of aorta: Secondary | ICD-10-CM | POA: Diagnosis not present

## 2022-11-15 DIAGNOSIS — M797 Fibromyalgia: Secondary | ICD-10-CM | POA: Diagnosis not present

## 2022-11-15 DIAGNOSIS — Z01419 Encounter for gynecological examination (general) (routine) without abnormal findings: Secondary | ICD-10-CM | POA: Diagnosis not present

## 2022-11-15 DIAGNOSIS — M810 Age-related osteoporosis without current pathological fracture: Secondary | ICD-10-CM | POA: Diagnosis not present

## 2022-11-15 DIAGNOSIS — K219 Gastro-esophageal reflux disease without esophagitis: Secondary | ICD-10-CM | POA: Diagnosis not present

## 2022-11-15 DIAGNOSIS — E782 Mixed hyperlipidemia: Secondary | ICD-10-CM | POA: Diagnosis not present

## 2022-11-15 DIAGNOSIS — G479 Sleep disorder, unspecified: Secondary | ICD-10-CM | POA: Diagnosis not present

## 2022-11-15 DIAGNOSIS — Z79899 Other long term (current) drug therapy: Secondary | ICD-10-CM | POA: Diagnosis not present

## 2022-11-15 DIAGNOSIS — Z6822 Body mass index (BMI) 22.0-22.9, adult: Secondary | ICD-10-CM | POA: Diagnosis not present

## 2022-11-15 DIAGNOSIS — F3341 Major depressive disorder, recurrent, in partial remission: Secondary | ICD-10-CM | POA: Diagnosis not present

## 2022-12-07 DIAGNOSIS — F4310 Post-traumatic stress disorder, unspecified: Secondary | ICD-10-CM | POA: Diagnosis not present

## 2022-12-07 DIAGNOSIS — F411 Generalized anxiety disorder: Secondary | ICD-10-CM | POA: Diagnosis not present

## 2023-01-01 DIAGNOSIS — L821 Other seborrheic keratosis: Secondary | ICD-10-CM | POA: Diagnosis not present

## 2023-01-01 DIAGNOSIS — D225 Melanocytic nevi of trunk: Secondary | ICD-10-CM | POA: Diagnosis not present

## 2023-01-01 DIAGNOSIS — L578 Other skin changes due to chronic exposure to nonionizing radiation: Secondary | ICD-10-CM | POA: Diagnosis not present

## 2023-01-01 DIAGNOSIS — Z85828 Personal history of other malignant neoplasm of skin: Secondary | ICD-10-CM | POA: Diagnosis not present

## 2023-01-01 DIAGNOSIS — Z86018 Personal history of other benign neoplasm: Secondary | ICD-10-CM | POA: Diagnosis not present

## 2023-01-01 DIAGNOSIS — D485 Neoplasm of uncertain behavior of skin: Secondary | ICD-10-CM | POA: Diagnosis not present

## 2023-01-01 DIAGNOSIS — L814 Other melanin hyperpigmentation: Secondary | ICD-10-CM | POA: Diagnosis not present

## 2023-01-01 DIAGNOSIS — Z85820 Personal history of malignant melanoma of skin: Secondary | ICD-10-CM | POA: Diagnosis not present

## 2023-01-30 ENCOUNTER — Other Ambulatory Visit: Payer: Self-pay

## 2023-01-30 ENCOUNTER — Encounter: Payer: Self-pay | Admitting: Orthopaedic Surgery

## 2023-01-30 ENCOUNTER — Ambulatory Visit: Payer: PPO | Admitting: Orthopaedic Surgery

## 2023-01-30 VITALS — BP 144/85 | HR 66 | Ht 66.25 in

## 2023-01-30 DIAGNOSIS — M25551 Pain in right hip: Secondary | ICD-10-CM | POA: Diagnosis not present

## 2023-01-30 DIAGNOSIS — Z981 Arthrodesis status: Secondary | ICD-10-CM

## 2023-01-30 DIAGNOSIS — M25552 Pain in left hip: Secondary | ICD-10-CM | POA: Diagnosis not present

## 2023-02-02 ENCOUNTER — Ambulatory Visit: Payer: PPO | Admitting: Orthopaedic Surgery

## 2023-02-05 ENCOUNTER — Telehealth: Payer: Self-pay | Admitting: Internal Medicine

## 2023-02-05 ENCOUNTER — Ambulatory Visit: Payer: PPO | Admitting: Nurse Practitioner

## 2023-02-05 DIAGNOSIS — Z87898 Personal history of other specified conditions: Secondary | ICD-10-CM

## 2023-02-05 DIAGNOSIS — E782 Mixed hyperlipidemia: Secondary | ICD-10-CM

## 2023-02-05 DIAGNOSIS — E785 Hyperlipidemia, unspecified: Secondary | ICD-10-CM

## 2023-02-05 DIAGNOSIS — I951 Orthostatic hypotension: Secondary | ICD-10-CM

## 2023-02-05 DIAGNOSIS — Z79899 Other long term (current) drug therapy: Secondary | ICD-10-CM

## 2023-02-05 NOTE — Progress Notes (Signed)
Office Visit Note   Patient: Bonnie Juarez           Date of Birth: 15-Oct-1945           MRN: [id number] Visit Date: 01/30/2023              Requested by: Gweneth Dimitri, MD [address] [place],  [place] [address] PCP: Gweneth Dimitri, MD   Assessment & Plan: Visit Diagnoses:  1. Bilateral hip pain   2. S/P lumbar fusion     Plan: Will set up for some physical therapy post fusion with persistent problems with this protrusion L2-3 L3-4 with bilateral hip pain worse on the left than right.  She has persistent problems after therapy we may need to consider repeat lumbar imaging studies.  Follow-Up Instructions: No follow-ups on file.   Orders:  Orders Placed This Encounter  Procedures   XR HIPS BILAT W OR W/O PELVIS 3-4 VIEWS   Ambulatory referral to Physical Therapy   No orders of the defined types were placed in this encounter.     Procedures: No procedures performed   Clinical Data: No additional findings.   Subjective: Chief Complaint  Patient presents with   Right Hip - Pain   Left Hip - Pain    HPI 77 year old female with ongoing problems with pain in both hips worse on the left than right.  She was started on some Avenity and had 2 injections with horrible pain and had to stop the medication.  Said more pain laterally in her hips previous cervical fusion by Dr. Jacqulyn Bath with collateral procedures.  Bone density last winter showed osteoporosis.  She has used Tylenol yes occasional CBD which seems to help to some degree.  Previous imaging showed fusion at L4-5 with disc protrusion at L2-3 and L3-4.  She has not been through any therapy in the last 18 months.  Review of Systems past history of fibromyalgia.  Previous cervical fusion doing well.  Lumbar fusion L4-5.  Hernia repair without problems 2022.  All systems noncontributory to HPI.   Objective: Vital Signs: BP (!) 144/85   Pulse 66   Ht 5' 6.25" (1.683 m)   BMI 22.87 kg/m   Physical  Exam Constitutional:      Appearance: She is well-developed.  HENT:     Head: Normocephalic.     Right Ear: External ear normal.     Left Ear: External ear normal. There is no impacted cerumen.  Eyes:     Pupils: Pupils are equal, round, and reactive to light.  Neck:     Thyroid: No thyromegaly.     Trachea: No tracheal deviation.  Cardiovascular:     Rate and Rhythm: Normal rate.  Pulmonary:     Effort: Pulmonary effort is normal.  Abdominal:     Palpations: Abdomen is soft.  Musculoskeletal:     Cervical back: No rigidity.  Skin:    General: Skin is warm and dry.  Neurological:     Mental Status: She is alert and oriented to person, place, and time.  Psychiatric:        Behavior: Behavior normal.     Ortho Exam patient has more tenderness of the left and right trochanter some sciatic notch tenderness well-healed lumbar incision.  Anterior tib EHL is intact.  Quads are strong negative logroll hips.  Specialty Comments:  No specialty comments available.  Imaging: No results found.   PMFS History: Patient Active Problem List   Diagnosis  Date Noted   Gastroparesis 12/06/2020   History of repair of hiatal hernia 04/13/2020   Plantar fasciitis of left foot 10/02/2018   S/P lumbar fusion 02/06/2018   Mixed dyslipidemia 11/13/2017   Family history of heart disease 11/13/2017   Statin intolerance 11/13/2017   Bilateral primary osteoarthritis of knee 10/30/2017   S/P cervical spinal fusion 12/20/2016   Spondylosis without myelopathy or radiculopathy, lumbar region 10/05/2016   Chronic bilateral low back pain without sciatica 10/05/2016   Fibromyalgia 10/05/2016   Closed fracture of right proximal humerus 01/08/2013    Class: Acute   Past Medical History:  Diagnosis Date   Anxiety    Arthritis    Cancer (HCC)    Hx: of squamouscell carcinoma on Left shin basal cell on face; melanoma on right lower leg   Complication of anesthesia    12/12/03 deceased LOC and  hypoxia overmedicating with Morphine and Phenergan; received Narcan X 2, overnight pulse ox   Depression    Fibromyalgia    Frequent urination    GERD (gastroesophageal reflux disease)    Hx: of   H/O hiatal hernia    PMH: of   Headache(784.0)    Hypertension    IBS (irritable bowel syndrome)    Hx: of   Melanoma (HCC)    Right leg   Osteopenia    Hx: of per pt, pt. adds osteoporosis- DOS   Pneumonia    hx   PONV (postoperative nausea and vomiting)     Family History  Problem Relation Age of Onset   Heart disease Other    Lung disease Other    Breast cancer Neg Hx     Past Surgical History:  Procedure Laterality Date   APPENDECTOMY     ARCUATE KERATECTOMY     BREAST LUMPECTOMY Right 1991   CERVICAL FUSION  1983   FRACTURE SURGERY Right 2014   humerus   HERNIA REPAIR     hiatal   KNEE ARTHROSCOPY Right 2015   NICEN FUNDO PLACATION  2010   NISSEN FUNDOPLICATION N/A 04/13/2020   Procedure: EXPLORATION OF FORGUT WITH ENDOSCOPY AND HITAL HERNIA CLOSURE, UPPER ENDOSCOPY;  Surgeon: Luretha Murphy, MD;  Location: [facility];  Service: General;  Laterality: N/A;   ORIF HUMERUS FRACTURE Right 01/08/2013   Procedure: OPEN REDUCTION INTERNAL FIXATION (ORIF) PROXIMAL HUMERUS FRACTURE;  Surgeon: Eldred Manges, MD;  Location: [facility] OR;  Service: Orthopedics;  Laterality: Right;  Open Reduction Internal Fixation Right Proximal Humerus Fracture   POSTERIOR CERVICAL FUSION/FORAMINOTOMY N/A 12/20/2016   Procedure: Posterior Cervical Fusion with lateral mass fixation - Cervical Three-Thoracic Two;  Surgeon: Tia Alert, MD;  Location: [facility] OR;  Service: Neurosurgery;  Laterality: N/A;   RADIAL HEAD REPLACEMENT  2005   trigger thrumb release Left 2001   tubular plasity  1970   XI ROBOTIC ASSISTED HIATAL HERNIA REPAIR N/A 12/06/2020   Procedure: XI ROBOTIC ASSISTED REDO HIATAL HERNIA REPAIR;  Surgeon: Luretha Murphy, MD;  Location: [facility];  Service: General;  Laterality: N/A;   Social History    Occupational History   Not on file  Tobacco Use   Smoking status: Former    Current packs/day: 0.00    Average packs/day: 1 pack/day for 15.0 years (15.0 ttl pk-yrs)    Types: Cigarettes    Start date: 12/12/1965    Quit date: 12/12/1980    Years since quitting: 42.1   Smokeless tobacco: Never   Tobacco comments:    Quit 1982"  Vaping Use   Vaping status: Never Used  Substance and Sexual Activity   Alcohol use: Yes    Alcohol/week: 7.0 - 10.0 standard drinks of alcohol    Types: 7 - 10 Glasses of wine per week    Comment: occ glass of owine   Drug use: No   Sexual activity: Not on file

## 2023-02-05 NOTE — Telephone Encounter (Signed)
Spoke with pt, she usually gets a lipid profile prior to upcoming appointment. Aware will forward to emily to make sure no other blood work is needed.

## 2023-02-05 NOTE — Progress Notes (Unsigned)
Cardiology Clinic Note   Patient Name: Bonnie Juarez Date of Encounter: 02/05/2023  Primary Care Provider:  Gweneth Dimitri, MD Primary Cardiologist:  Chrystie Nose, MD  Patient Profile    77 year old female with a history of syncope, mild coronary artery calcification, hypertension, orthostatic hypotension, mixed dyslipidemia with statin intolerance, anxiety, depression, and fibromyalgia who presents for follow-up related to hypertension.   Past Medical History    Past Medical History:  Diagnosis Date   Anxiety    Arthritis    Cancer (HCC)    Hx: of squamouscell carcinoma on Left shin basal cell on face; melanoma on right lower leg   Complication of anesthesia    November 14, 2003 deceased LOC and hypoxia overmedicating with Morphine and Phenergan; received Narcan X 2, overnight pulse ox   Depression    Fibromyalgia    Frequent urination    GERD (gastroesophageal reflux disease)    Hx: of   H/O hiatal hernia    PMH: of   Headache(784.0)    Hypertension    IBS (irritable bowel syndrome)    Hx: of   Melanoma (HCC)    Right leg   Osteopenia    Hx: of per pt, pt. adds osteoporosis- DOS   Pneumonia    hx   PONV (postoperative nausea and vomiting)    Past Surgical History:  Procedure Laterality Date   APPENDECTOMY     ARCUATE KERATECTOMY     BREAST LUMPECTOMY Right 1991   CERVICAL FUSION  1983   FRACTURE SURGERY Right 2014   humerus   HERNIA REPAIR     hiatal   KNEE ARTHROSCOPY Right 2015   NICEN FUNDO PLACATION  2010   NISSEN FUNDOPLICATION N/A 04/13/2020   Procedure: EXPLORATION OF FORGUT WITH ENDOSCOPY AND HITAL HERNIA CLOSURE, UPPER ENDOSCOPY;  Surgeon: Luretha Murphy, MD;  Location: [facility];  Service: General;  Laterality: N/A;   ORIF HUMERUS FRACTURE Right 01/08/2013   Procedure: OPEN REDUCTION INTERNAL FIXATION (ORIF) PROXIMAL HUMERUS FRACTURE;  Surgeon: Eldred Manges, MD;  Location: [facility] OR;  Service: Orthopedics;  Laterality: Right;  Open Reduction Internal Fixation  Right Proximal Humerus Fracture   POSTERIOR CERVICAL FUSION/FORAMINOTOMY N/A 12/20/2016   Procedure: Posterior Cervical Fusion with lateral mass fixation - Cervical Three-Thoracic Two;  Surgeon: Tia Alert, MD;  Location: [facility] OR;  Service: Neurosurgery;  Laterality: N/A;   RADIAL HEAD REPLACEMENT  2005   trigger thrumb release Left 2001   tubular plasity  1970   XI ROBOTIC ASSISTED HIATAL HERNIA REPAIR N/A 12/06/2020   Procedure: XI ROBOTIC ASSISTED REDO HIATAL HERNIA REPAIR;  Surgeon: Luretha Murphy, MD;  Location: [facility];  Service: General;  Laterality: N/A;    Allergies  Allergies  Allergen Reactions   Boniva [Ibandronic Acid] Other (See Comments)    Bone Pain   Dilaudid [Hydromorphone Hcl] Other (See Comments)    Hallucinations   Risedronate Sodium Other (See Comments)    Bone pain   Statins Other (See Comments)    History of Present Illness    77 year old female with the above past medical history including syncope, mild coronary artery calcification, hypertension,  orthostatic hypotension, mixed dyslipidemia with statin intolerance, anxiety, depression, and fibromyalgia.   Echocardiogram in 2014 showed normal LV function, no significant valvular abnormalities. She has a strong history of CAD.  Coronary calcium score in 2019 was 80 (59th percentile). She has followed with Dr. Rennis Golden for management of dyslipidemia with statin intolerance, on Repatha.  She had a syncopal  episode in 01/2023.  Carotid ultrasound revealed 1 to 39% R ICA stenosis.  Cardiac monitor showed episodes of PSVT, longest lasting 18 beats.  Echocardiogram showed mild diastolic dysfunction, normal LV function.  She was noted to be severely orthostatic.  All BP medications were discontinued.  She was last seen in the office on 08/08/2022 and was stable from a cardiac standpoint.  BP was well-controlled.  She denied any further dizziness, presyncope or syncope.   She presents today for follow-up.  Since her last  visit she   1. Presyncope/syncope/Orthostatic hypotension:  Carotid ultrasound following syncopal episode revealed 1 to 39% R ICA stenosis.  Cardiac monitor showed episodes of PSVT, longest lasting 18 beats.  Echo showed mild diastolic dysfunction, normal LV function. Syncope thought to have occurred in the setting of severe orthostatic hypotension. She denies any recurrent dizziness, presyncope, syncope.  BP has been stable off all BP meds.   2. Coronary artery calcification: Coronary calcium score in 2019 was 80 (59th percentile).  Stable with no anginal symptoms. No indication for ischemic evaluation.  Continue Repatha.   3. Hypertension: BP stable off meds.  Advised her to continue to monitor BP and report BP consistently greater than 150/80 (some permissive hypertension in the setting of symptomatic orthostatic hypotension).    4. Mixed dyslipidemia: LDL was 65 in 02/2022.  Continue Repatha.   5. Disposition: Follow-up in  Home Medications    Current Outpatient Medications  Medication Sig Dispense Refill   acetaminophen (TYLENOL) 500 MG tablet Take 500-1,000 mg by mouth every 6 (six) hours as needed for moderate pain.     Ascorbic Acid (VITAMIN C) 1000 MG tablet Take 1,000 mg by mouth daily.     cholecalciferol (VITAMIN D3) 25 MCG (1000 UNIT) tablet Take 1,000 Units by mouth every other day.     Coenzyme Q10 (COQ10) 200 MG CAPS Take 200 mg by mouth daily.     cromolyn (NASALCROM) 5.2 MG/ACT nasal spray Place 1 spray into both nostrils 2 (two) times daily as needed for allergies.     denosumab (PROLIA) 60 MG/ML SOSY injection Inject 60 mg into the skin every 6 (six) months. (Patient not taking: Reported on 08/08/2022)     escitalopram (LEXAPRO) 20 MG tablet Take 40 mg by mouth at bedtime.  2   EVENITY 105 MG/1. SOSY injection Inject 210 mg into the skin every 30 (thirty) days.     Evolocumab (REPATHA SURECLICK) 140 MG/ML SOAJ Inject 140 mg into the skin every 14 (fourteen) days. 2 mL 11    loratadine (CLARITIN) 10 MG tablet Take 10 mg by mouth daily.     Polyethyl Glycol-Propyl Glycol (SYSTANE) 0.4-0.3 % SOLN Place 1-2 drops into both eyes 3 (three) times daily as needed (dry/irritated eyes).     RABEprazole (ACIPHEX) 20 MG tablet Take 20 mg by mouth daily.     traZODone (DESYREL) 100 MG tablet Take 100 mg by mouth at bedtime.   2   vitamin B-12 (CYANOCOBALAMIN) 1000 MCG tablet Take 1,000 mcg by mouth every other day.     No current facility-administered medications for this visit.     Family History    Family History  Problem Relation Age of Onset   Heart disease Other    Lung disease Other    Breast cancer Neg Hx    She indicated that the status of her neg hx is unknown. She indicated that the status of her other is unknown.  Social History  Social History   Socioeconomic History   Marital status: Married    Spouse name: Not on file   Number of children: Not on file   Years of education: Not on file   Highest education level: Not on file  Occupational History   Not on file  Tobacco Use   Smoking status: Former    Current packs/day: 0.00    Average packs/day: 1 pack/day for 15.0 years (15.0 ttl pk-yrs)    Types: Cigarettes    Start date: 12/12/1965    Quit date: 12/12/1980    Years since quitting: 42.1   Smokeless tobacco: Never   Tobacco comments:    Quit 1982"  Vaping Use   Vaping status: Never Used  Substance and Sexual Activity   Alcohol use: Yes    Alcohol/week: 7.0 - 10.0 standard drinks of alcohol    Types: 7 - 10 Glasses of wine per week    Comment: occ glass of owine   Drug use: No   Sexual activity: Not on file  Other Topics Concern   Not on file  Social History Narrative   Not on file   Social Determinants of Health   Financial Resource Strain: Not on file  Food Insecurity: Not on file  Transportation Needs: Not on file  Physical Activity: Not on file  Stress: Not on file  Social Connections: Unknown (03/17/2022)    Received from [facility]   Social Network    Social Network: Not on file  Intimate Partner Violence: Unknown (03/17/2022)   Received from [facility]   HITS    Physically Hurt: Not on file    Insult or Talk Down To: Not on file    Threaten Physical Harm: Not on file    Scream or Curse: Not on file     Review of Systems    General:  No chills, fever, night sweats or weight changes.  Cardiovascular:  No chest pain, dyspnea on exertion, edema, orthopnea, palpitations, paroxysmal nocturnal dyspnea. Dermatological: No rash, lesions/masses Respiratory: No cough, dyspnea Urologic: No hematuria, dysuria Abdominal: No nausea, vomiting, diarrhea, bright red blood per rectum, melena, or hematemesis Neurologic: No visual changes, wkns, changes in mental status. All other systems reviewed and are otherwise negative except as noted above.     Physical Exam    VS:  There were no vitals taken for this visit. , BMI There is no height or weight on file to calculate BMI.     GEN: Well nourished, well developed, in no acute distress. HEENT: normal. Neck: Supple, no JVD, carotid bruits, or masses. Cardiac: RRR, no murmurs, rubs, or gallops. No clubbing, cyanosis, edema.  Radials/DP/PT 2+ and equal bilaterally.  Respiratory:  Respirations regular and unlabored, clear to auscultation bilaterally. GI: Soft, nontender, nondistended, BS + x 4. MS: no deformity or atrophy. Skin: warm and dry, no rash. Neuro:  Strength and sensation are intact. Psych: Normal affect.  Accessory Clinical Findings    ECG personally reviewed by me today- *** - No acute changes  Lab Results  Component Value Date   WBC 7.3 03/02/2022   HGB 12.5 03/02/2022   HCT 37.3 03/02/2022   MCV 87 03/02/2022   PLT 225 03/02/2022   Lab Results  Component Value Date   CREATININE 0.91 03/02/2022   BUN 10 03/02/2022   NA 140 03/02/2022   K 4.8 03/02/2022   CL 102 03/02/2022   CO2 20  03/02/2022   Lab Results  Component Value Date   ALT 15 01/06/2013   AST 18 01/06/2013   ALKPHOS 68 01/06/2013   BILITOT 0.6 01/06/2013   Lab Results  Component Value Date   CHOL 165 03/20/2022   HDL 87 03/20/2022   LDLCALC 65 03/20/2022   TRIG 65 03/20/2022   CHOLHDL 1.9 03/20/2022    No results found for: "HGBA1C"  Assessment & Plan   1.  ***  Joylene Grapes, NP 02/05/2023, 5:37 AM

## 2023-02-05 NOTE — Telephone Encounter (Signed)
  Patient would like to know if she needs to have any lab work done before her 6 month f/u appt in January. Please advise.

## 2023-02-08 DIAGNOSIS — R7303 Prediabetes: Secondary | ICD-10-CM | POA: Diagnosis not present

## 2023-02-08 DIAGNOSIS — M810 Age-related osteoporosis without current pathological fracture: Secondary | ICD-10-CM | POA: Diagnosis not present

## 2023-02-08 DIAGNOSIS — I10 Essential (primary) hypertension: Secondary | ICD-10-CM | POA: Diagnosis not present

## 2023-02-08 DIAGNOSIS — E782 Mixed hyperlipidemia: Secondary | ICD-10-CM | POA: Diagnosis not present

## 2023-02-08 DIAGNOSIS — Z634 Disappearance and death of family member: Secondary | ICD-10-CM | POA: Diagnosis not present

## 2023-02-08 NOTE — Telephone Encounter (Signed)
Spoke with pt. Labs fasting lipid panel, CMET, CBC, TSH were ordered to complete prior to next apt.

## 2023-02-13 ENCOUNTER — Ambulatory Visit: Payer: PPO | Admitting: Physical Therapy

## 2023-02-14 ENCOUNTER — Ambulatory Visit: Payer: PPO | Admitting: Physical Therapy

## 2023-02-14 ENCOUNTER — Encounter: Payer: Self-pay | Admitting: Physical Therapy

## 2023-02-14 DIAGNOSIS — M546 Pain in thoracic spine: Secondary | ICD-10-CM | POA: Diagnosis not present

## 2023-02-14 DIAGNOSIS — M5459 Other low back pain: Secondary | ICD-10-CM

## 2023-02-14 DIAGNOSIS — M25551 Pain in right hip: Secondary | ICD-10-CM | POA: Diagnosis not present

## 2023-02-14 DIAGNOSIS — M25552 Pain in left hip: Secondary | ICD-10-CM | POA: Diagnosis not present

## 2023-02-14 DIAGNOSIS — R2689 Other abnormalities of gait and mobility: Secondary | ICD-10-CM | POA: Diagnosis not present

## 2023-02-14 NOTE — Therapy (Signed)
OUTPATIENT PHYSICAL THERAPY THORACOLUMBAR EVALUATION   Patient Name: Bonnie Juarez MRN: [id number] DOB:08-10-1945, 77 y.o., female Today's Date: 02/14/2023  END OF SESSION:  PT End of Session - 02/14/23 1438     Visit Number 1    Number of Visits 15    Date for PT Re-Evaluation 04/11/23    Progress Note Due on Visit 10    PT Start Time 1345    PT Stop Time 1430    PT Time Calculation (min) 45 min    Activity Tolerance Patient tolerated treatment well    Behavior During Therapy WFL for tasks assessed/performed             Past Medical History:  Diagnosis Date   Anxiety    Arthritis    Cancer (HCC)    Hx: of squamouscell carcinoma on Left shin basal cell on face; melanoma on right lower leg   Complication of anesthesia    2003/12/10 deceased LOC and hypoxia overmedicating with Morphine and Phenergan; received Narcan X 2, overnight pulse ox   Depression    Fibromyalgia    Frequent urination    GERD (gastroesophageal reflux disease)    Hx: of   H/O hiatal hernia    PMH: of   Headache(784.0)    Hypertension    IBS (irritable bowel syndrome)    Hx: of   Melanoma (HCC)    Right leg   Osteopenia    Hx: of per pt, pt. adds osteoporosis- DOS   Pneumonia    hx   PONV (postoperative nausea and vomiting)    Past Surgical History:  Procedure Laterality Date   APPENDECTOMY     ARCUATE KERATECTOMY     BREAST LUMPECTOMY Right 1991   CERVICAL FUSION  1983   FRACTURE SURGERY Right 2014   humerus   HERNIA REPAIR     hiatal   KNEE ARTHROSCOPY Right 2015   NICEN FUNDO PLACATION  2010   NISSEN FUNDOPLICATION N/A 04/13/2020   Procedure: EXPLORATION OF FORGUT WITH ENDOSCOPY AND HITAL HERNIA CLOSURE, UPPER ENDOSCOPY;  Surgeon: Luretha Murphy, MD;  Location: [facility];  Service: General;  Laterality: N/A;   ORIF HUMERUS FRACTURE Right 01/08/2013   Procedure: OPEN REDUCTION INTERNAL FIXATION (ORIF) PROXIMAL HUMERUS FRACTURE;  Surgeon: Eldred Manges, MD;  Location: [facility];  Service:  Orthopedics;  Laterality: Right;  Open Reduction Internal Fixation Right Proximal Humerus Fracture   POSTERIOR CERVICAL FUSION/FORAMINOTOMY N/A 12/20/2016   Procedure: Posterior Cervical Fusion with lateral mass fixation - Cervical Three-Thoracic Two;  Surgeon: Tia Alert, MD;  Location: [facility];  Service: Neurosurgery;  Laterality: N/A;   RADIAL HEAD REPLACEMENT  2005   trigger thrumb release Left 2001   tubular plasity  1970   XI ROBOTIC ASSISTED HIATAL HERNIA REPAIR N/A 12/06/2020   Procedure: XI ROBOTIC ASSISTED REDO HIATAL HERNIA REPAIR;  Surgeon: Luretha Murphy, MD;  Location: [facility];  Service: General;  Laterality: N/A;   Patient Active Problem List   Diagnosis Date Noted   Gastroparesis 12/06/2020   History of repair of hiatal hernia 04/13/2020   Plantar fasciitis of left foot 10/02/2018   S/P lumbar fusion 02/06/2018   Mixed dyslipidemia 11/13/2017   Family history of heart disease 11/13/2017   Statin intolerance 11/13/2017   Bilateral primary osteoarthritis of knee 10/30/2017   S/P cervical spinal fusion 12/20/2016   Spondylosis without myelopathy or radiculopathy, lumbar region 10/05/2016   Chronic bilateral low back pain without sciatica 10/05/2016   Fibromyalgia 10/05/2016  Closed fracture of right proximal humerus 01/08/2013    Class: Acute    PCP: Gweneth Dimitri, MD   REFERRING PROVIDER: Eldred Manges, MD   REFERRING DIAG: [phone] (ICD-10-CM) - Bilateral hip pain  Rationale for Evaluation and Treatment: Rehabilitation  THERAPY DIAG:  Pain in left hip  Pain in right hip  Other low back pain  Pain in thoracic spine  Other abnormalities of gait and mobility  ONSET DATE: Chronic pain  SUBJECTIVE:                                                                                                                                                                                           SUBJECTIVE STATEMENT: Relays more overall pain in neck and back  lately. She has started injections for bone density In June and this has increased some hip pain she feels. She is having trouble with daily activities,her husband passed away in 2022/04/10 so grief with this along with inactivity. She is not sleeping very well. She denies N/T in her legs. She has difficulty standing.   PERTINENT HISTORY:   Low back pain, fusion L4-5 in 2019, disc protrusions L2-3 and L3-4 PMH: lumbar and cervical fusion, fibromyalgia,knee OA,  PAIN:  NPRS scale: 3/10 upon arrival, can get up to 10 Pain location:back and down her legs Pain description: constant but the intense pain comes and goes Aggravating factors: turning her head, standing,  Relieving factors: rest, meds  PRECAUTIONS: None  RED FLAGS: None   WEIGHT BEARING RESTRICTIONS: No  FALLS:  Has patient fallen in last 6 months? Yes. Number of falls 1, tripped over bedding   PLOF: Independent with basic ADLs  PATIENT GOALS: reduce pain, improve activity  NEXT MD VISIT: nothing scheduled at eval  OBJECTIVE:  Note: Objective measures were completed at Evaluation unless otherwise noted.  DIAGNOSTIC FINDINGS:  XR in chart  PATIENT SURVEYS:  Eval: FOTO 61% functional, goal is 66%  COGNITION: Overall cognitive status: Within functional limits for tasks assessed     SENSATION: WFL  MUSCLE LENGTH: Hamstrings: tight bilat Thomas test: tight bilat  LUMBAR ROM:   AROM eval  Flexion 50%  Extension 50%  Right lateral flexion   Left lateral flexion   Right rotation 25%  Left rotation 25%   (Blank rows = not tested)  LOWER EXTREMITY ROM:     Active  Right eval Left eval  Hip flexion    Hip extension    Hip abduction    Hip adduction    Hip internal rotation    Hip external rotation    Knee flexion    Knee extension  Ankle dorsiflexion    Ankle plantarflexion    Ankle inversion    Ankle eversion     (Blank rows = not tested)  LOWER EXTREMITY MMT:    MMT Right eval Left eval   Hip flexion [facility]  Hip extension    Hip abduction [facility]  Hip adduction    Hip internal rotation    Hip external rotation    Knee flexion [facility] WFL  Knee extension [facility]  Ankle dorsiflexion    Ankle plantarflexion    Ankle inversion    Ankle eversion     (Blank rows = not tested)  FUNCTIONAL TESTS:  Timed up and go (TUG): 17:01 sec  TODAY'S TREATMENT:  Eval Nu step L5 UE/LE X 8 min HEP creation and review with demonstration and trial set preformed, see below for details Walking program recommendations   PATIENT EDUCATION: Education details: HEP, PT plan of care Person educated: Patient Education method: Explanation, Demonstration, Verbal cues, and Handouts Education comprehension: verbalized understanding and needs further education   HOME EXERCISE PROGRAM: Access Code: [email or web address] URL: [email or web address] Date: 02/14/2023 Prepared by: Ivery Quale  Exercises - Standing 'L' Stretch at Counter  - 2 x daily - 6 x weekly - 1 sets - 10 reps - 5 hold - Side Stepping with Counter Support  - 2 x daily - 6 x weekly - 1 sets - 5 reps - alternating marching  - 2 x daily - 6 x weekly - 1 sets - 15-20 reps - Standing Hip Extension with Counter Support  - 2 x daily - 6 x weekly - 1 sets - 15-20 reps - Standing Tandem Balance with Counter Support  - 2 x daily - 6 x weekly - 1 sets - 3 reps - 30 hold - Standing Scapular Retraction with External Rotation  - 2 x daily - 6 x weekly - 1 sets - 10 reps - 5 sec hold - Standing Lumbar Extension  - 2 x daily - 6 x weekly - 1 sets - 10 reps - 5 sec hold - Seated Assisted Cervical Rotation with Towel  - 2 x daily - 6 x weekly - 1 sets - 10 reps - 5 hold  ASSESSMENT:  CLINICAL IMPRESSION: Patient referred to PT for chronic bilat hip pain and chronic low back pain, S/P fusion L4-5 in 2019, disc protrusions L2-3 and L3-4. She has had a difficult year with death of her husband and has been less active and not sleeping well which  are factors that can contribute to her pain. Patient will benefit from skilled PT to address below impairments, limitations and improve overall function.  OBJECTIVE IMPAIRMENTS: decreased activity tolerance, difficulty walking, decreased balance, decreased endurance, decreased mobility, decreased ROM, decreased strength, impaired flexibility, impaired UE/LE use, postural dysfunction, and pain.  ACTIVITY LIMITATIONS: bending, lifting, carry, locomotion, cleaning, community activity, driving  PERSONAL FACTORS: see above PMH are also affecting patient's functional outcome.  REHAB POTENTIAL: Good  CLINICAL DECISION MAKING: Stable/uncomplicated  EVALUATION COMPLEXITY: Low    GOALS: Short term PT Goals Target date: 03/14/2023   Pt will be I and compliant with HEP. Baseline:  Goal status: New Pt will decrease pain by 25% overall Baseline: Goal status: New  Long term PT goals Target date:04/11/2023   Pt will improve ROM to [facility] to improve functional mobility Baseline: Goal status: New Pt will improve  hip/knee strength to at least 5-/5 MMT to improve functional strength Baseline: Goal status: New Pt will improve FOTO  to at least % functional to show improved function Baseline: Goal status: New Pt will reduce pain by overall 50% overall with usual activity Baseline: Goal status: New Pt will reduce pain to overall less than 2-3/10 with usual activity and work activity. Baseline: Goal status: New Pt will be able to ambulate community distances at least 1000 ft WNL gait pattern without complaints Baseline: Goal status: New  PLAN: PT FREQUENCY: 1-2 times per week   PT DURATION: 6-8 weeks  PLANNED INTERVENTIONS (unless contraindicated): aquatic PT, Canalith repositioning, cryotherapy, Electrical stimulation, Iontophoresis with 4 mg/ml dexamethasome, Moist heat, traction, Ultrasound, gait training, Therapeutic exercise, balance training, neuromuscular re-education, patient/family  education,  manual techniques, passive ROM, dry needling, taping, vasopnuematic device, vestibular, spinal manipulations, joint manipulations 97110-Therapeutic exercises, 97530- Therapeutic activity, [id number]- Neuromuscular re-education, 97535- Self Care, [id number]- Manual therapy, [id number]- Gait training, and [phone]- Aquatic Therapy  PLAN FOR NEXT SESSION: review and update HEP PRN, gentle progressions for strength, gait, balance, mobility as tolerated. Has she started walking program of 5 min walks a few times per day?    April Manson, PT,DPT 02/14/2023, 2:45 PM

## 2023-02-27 ENCOUNTER — Encounter: Payer: PPO | Admitting: Rehabilitative and Restorative Service Providers"

## 2023-03-06 ENCOUNTER — Ambulatory Visit: Payer: PPO | Admitting: Physical Therapy

## 2023-03-06 ENCOUNTER — Encounter: Payer: Self-pay | Admitting: Physical Therapy

## 2023-03-06 DIAGNOSIS — M25551 Pain in right hip: Secondary | ICD-10-CM

## 2023-03-06 DIAGNOSIS — M25552 Pain in left hip: Secondary | ICD-10-CM | POA: Diagnosis not present

## 2023-03-06 DIAGNOSIS — M546 Pain in thoracic spine: Secondary | ICD-10-CM

## 2023-03-06 DIAGNOSIS — M5459 Other low back pain: Secondary | ICD-10-CM

## 2023-03-06 DIAGNOSIS — R2689 Other abnormalities of gait and mobility: Secondary | ICD-10-CM

## 2023-03-06 NOTE — Therapy (Signed)
 OUTPATIENT PHYSICAL THERAPY TREATMENT  Patient Name: Bonnie Juarez MRN: [id number] DOB:Apr 15, 1945, 78 y.o., female Today's Date: 03/06/2023  END OF SESSION:  PT End of Session - 03/06/23 1437     Visit Number 2    Number of Visits 15    Date for PT Re-Evaluation 04/11/23    Progress Note Due on Visit 10    PT Start Time 1430    PT Stop Time 1508    PT Time Calculation (min) 38 min    Activity Tolerance Patient tolerated treatment well    Behavior During Therapy WFL for tasks assessed/performed             Past Medical History:  Diagnosis Date   Anxiety    Arthritis    Cancer (HCC)    Hx: of squamouscell carcinoma on Left shin basal cell on face; melanoma on right lower leg   Complication of anesthesia    November 18, 2003 deceased LOC and hypoxia overmedicating with Morphine  and Phenergan; received Narcan X 2, overnight pulse ox   Depression    Fibromyalgia    Frequent urination    GERD (gastroesophageal reflux disease)    Hx: of   H/O hiatal hernia    PMH: of   Headache(784.0)    Hypertension    IBS (irritable bowel syndrome)    Hx: of   Melanoma (HCC)    Right leg   Osteopenia    Hx: of per pt, pt. adds osteoporosis- DOS   Pneumonia    hx   PONV (postoperative nausea and vomiting)    Past Surgical History:  Procedure Laterality Date   APPENDECTOMY     ARCUATE KERATECTOMY     BREAST LUMPECTOMY Right 1991   CERVICAL FUSION  1983   FRACTURE SURGERY Right 2014   humerus   HERNIA REPAIR     hiatal   KNEE ARTHROSCOPY Right 2015   NICEN FUNDO PLACATION  2010   NISSEN FUNDOPLICATION N/A 04/13/2020   Procedure: EXPLORATION OF FORGUT WITH ENDOSCOPY AND HITAL HERNIA CLOSURE, UPPER ENDOSCOPY;  Surgeon: Gladis Cough, MD;  Location: [facility];  Service: General;  Laterality: N/A;   ORIF HUMERUS FRACTURE Right 01/08/2013   Procedure: OPEN REDUCTION INTERNAL FIXATION (ORIF) PROXIMAL HUMERUS FRACTURE;  Surgeon: Oneil JAYSON Herald, MD;  Location: [facility];  Service: Orthopedics;   Laterality: Right;  Open Reduction Internal Fixation Right Proximal Humerus Fracture   POSTERIOR CERVICAL FUSION/FORAMINOTOMY N/A 12/20/2016   Procedure: Posterior Cervical Fusion with lateral mass fixation - Cervical Three-Thoracic Two;  Surgeon: Joshua Alm RAMAN, MD;  Location: [facility] OR;  Service: Neurosurgery;  Laterality: N/A;   RADIAL HEAD REPLACEMENT  2005   trigger thrumb release Left 2001   tubular plasity  1970   XI ROBOTIC ASSISTED HIATAL HERNIA REPAIR N/A 12/06/2020   Procedure: XI ROBOTIC ASSISTED REDO HIATAL HERNIA REPAIR;  Surgeon: Gladis Cough, MD;  Location: [facility];  Service: General;  Laterality: N/A;   Patient Active Problem List   Diagnosis Date Noted   Gastroparesis 12/06/2020   History of repair of hiatal hernia 04/13/2020   Plantar fasciitis of left foot 10/02/2018   S/P lumbar fusion 02/06/2018   Mixed dyslipidemia 11/13/2017   Family history of heart disease 11/13/2017   Statin intolerance 11/13/2017   Bilateral primary osteoarthritis of knee 10/30/2017   S/P cervical spinal fusion 12/20/2016   Spondylosis without myelopathy or radiculopathy, lumbar region 10/05/2016   Chronic bilateral low back pain without sciatica 10/05/2016   Fibromyalgia 10/05/2016  Closed fracture of right proximal humerus 01/08/2013    Class: Acute    PCP: Aisha Harvey, MD   REFERRING PROVIDER: Barbarann Oneil BROCKS, MD   REFERRING DIAG: [phone] (ICD-10-CM) - Bilateral hip pain  Rationale for Evaluation and Treatment: Rehabilitation  THERAPY DIAG:  Pain in left hip  Pain in right hip  Other low back pain  Pain in thoracic spine  Other abnormalities of gait and mobility  ONSET DATE: Chronic pain  SUBJECTIVE:                                                                                                                                                                                           SUBJECTIVE STATEMENT: Relays the holidays were difficult and lots of  depression so has not done the exercises, has tried to walk more overall.   PERTINENT HISTORY:   Low back pain, fusion L4-5 in 2019, disc protrusions L2-3 and L3-4 PMH: lumbar and cervical fusion, fibromyalgia,knee OA,  PAIN:  NPRS scale: 3/10 upon arrival, can get up to 10 Pain location:back and down her legs Pain description: constant but the intense pain comes and goes Aggravating factors: turning her head, standing,  Relieving factors: rest, meds  PRECAUTIONS: None  RED FLAGS: None   WEIGHT BEARING RESTRICTIONS: No  FALLS:  Has patient fallen in last 6 months? Yes. Number of falls 1, tripped over bedding   PLOF: Independent with basic ADLs  PATIENT GOALS: reduce pain, improve activity  NEXT MD VISIT: nothing scheduled at eval  OBJECTIVE:  Note: Objective measures were completed at Evaluation unless otherwise noted.  DIAGNOSTIC FINDINGS:  XR in chart  PATIENT SURVEYS:  Eval: FOTO 61% functional, goal is 66%  COGNITION: Overall cognitive status: Within functional limits for tasks assessed     SENSATION: WFL  MUSCLE LENGTH: Hamstrings: tight bilat Thomas test: tight bilat  LUMBAR ROM:   AROM eval  Flexion 50%  Extension 50%  Right lateral flexion   Left lateral flexion   Right rotation 25%  Left rotation 25%   (Blank rows = not tested)  LOWER EXTREMITY ROM:     Active  Right eval Left eval  Hip flexion    Hip extension    Hip abduction    Hip adduction    Hip internal rotation    Hip external rotation    Knee flexion    Knee extension    Ankle dorsiflexion    Ankle plantarflexion    Ankle inversion    Ankle eversion     (Blank rows = not tested)  LOWER EXTREMITY MMT:    MMT Right eval Left eval  Hip flexion [facility]  Hip extension    Hip abduction [facility]  Hip adduction    Hip internal rotation    Hip external rotation    Knee flexion [facility] WFL  Knee extension [facility]  Ankle dorsiflexion    Ankle plantarflexion    Ankle  inversion    Ankle eversion     (Blank rows = not tested)  FUNCTIONAL TESTS:  Timed up and go (TUG): 17:01 sec  TODAY'S TREATMENT:  03/06/23 Nu step L5 UE/LE X 10 min Alternating unilat UE rows red X 15 each Shoulder extensions red X 15 bilat Standing lumbar L stretch 5 sec X 10 Sidestepping 3 round trips at counter without UE support Standing hip extensions X 20 bilat Standing marches X 20 bilat Tandem balance 30 sec X 3 bilat Cervical rotation stretch AAROM with towel 5 sec X 10 bilat   PATIENT EDUCATION: Education details: HEP, PT plan of care Person educated: Patient Education method: Explanation, Demonstration, Verbal cues, and Handouts Education comprehension: verbalized understanding and needs further education   HOME EXERCISE PROGRAM: Access Code: [email or web address] URL: [email or web address] Date: 02/14/2023 Prepared by: Redell Moose  Exercises - Standing 'L' Stretch at Counter  - 2 x daily - 6 x weekly - 1 sets - 10 reps - 5 hold - Side Stepping with Counter Support  - 2 x daily - 6 x weekly - 1 sets - 5 reps - alternating marching  - 2 x daily - 6 x weekly - 1 sets - 15-20 reps - Standing Hip Extension with Counter Support  - 2 x daily - 6 x weekly - 1 sets - 15-20 reps - Standing Tandem Balance with Counter Support  - 2 x daily - 6 x weekly - 1 sets - 3 reps - 30 hold - Standing Scapular Retraction with External Rotation  - 2 x daily - 6 x weekly - 1 sets - 10 reps - 5 sec hold - Standing Lumbar Extension  - 2 x daily - 6 x weekly - 1 sets - 10 reps - 5 sec hold - Seated Assisted Cervical Rotation with Towel  - 2 x daily - 6 x weekly - 1 sets - 10 reps - 5 hold  ASSESSMENT:  CLINICAL IMPRESSION:  03/06/23: Reviewed HEP with some cueing and demonstration needed. She is still battling with some severe depression and recommend to continue with exercise to help pain, physical, and mental wellbeing.    Eval Patient referred to PT for chronic bilat hip pain  and chronic low back pain, S/P fusion L4-5 in 2019, disc protrusions L2-3 and L3-4. She has had a difficult year with death of her husband and has been less active and not sleeping well which are factors that can contribute to her pain. Patient will benefit from skilled PT to address below impairments, limitations and improve overall function.  OBJECTIVE IMPAIRMENTS: decreased activity tolerance, difficulty walking, decreased balance, decreased endurance, decreased mobility, decreased ROM, decreased strength, impaired flexibility, impaired UE/LE use, postural dysfunction, and pain.  ACTIVITY LIMITATIONS: bending, lifting, carry, locomotion, cleaning, community activity, driving  PERSONAL FACTORS: see above PMH are also affecting patient's functional outcome.  REHAB POTENTIAL: Good  CLINICAL DECISION MAKING: Stable/uncomplicated  EVALUATION COMPLEXITY: Low    GOALS: Short term PT Goals Target date: 03/14/2023   Pt will be I and compliant with HEP. Baseline:  Goal status: New Pt will decrease pain by 25% overall Baseline: Goal status: New  Long term PT goals Target date:04/11/2023  Pt will improve ROM to [facility] to improve functional mobility Baseline: Goal status: New Pt will improve  hip/knee strength to at least 5-/5 MMT to improve functional strength Baseline: Goal status: New Pt will improve FOTO to at least % functional to show improved function Baseline: Goal status: New Pt will reduce pain by overall 50% overall with usual activity Baseline: Goal status: New Pt will reduce pain to overall less than 2-3/10 with usual activity and work activity. Baseline: Goal status: New Pt will be able to ambulate community distances at least 1000 ft WNL gait pattern without complaints Baseline: Goal status: New  PLAN: PT FREQUENCY: 1-2 times per week   PT DURATION: 6-8 weeks  PLANNED INTERVENTIONS (unless contraindicated): aquatic PT, Canalith repositioning, cryotherapy,  Electrical stimulation, Iontophoresis with 4 mg/ml dexamethasome, Moist heat, traction, Ultrasound, gait training, Therapeutic exercise, balance training, neuromuscular re-education, patient/family education,  manual techniques, passive ROM, dry needling, taping, vasopnuematic device, vestibular, spinal manipulations, joint manipulations 97110-Therapeutic exercises, 97530- Therapeutic activity, [id number]- Neuromuscular re-education, 97535- Self Care, [id number]- Manual therapy, [id number]- Gait training, and [phone]- Aquatic Therapy  PLAN FOR NEXT SESSION: rgentle progressions for strength, gait, balance, mobility as tolerated. Continue to encourage walking program of 5 min walks a few times per day    Redell JONELLE Moose, PT,DPT 03/06/2023, 3:21 PM

## 2023-03-07 DIAGNOSIS — E782 Mixed hyperlipidemia: Secondary | ICD-10-CM | POA: Diagnosis not present

## 2023-03-07 DIAGNOSIS — E785 Hyperlipidemia, unspecified: Secondary | ICD-10-CM | POA: Diagnosis not present

## 2023-03-07 DIAGNOSIS — Z79899 Other long term (current) drug therapy: Secondary | ICD-10-CM | POA: Diagnosis not present

## 2023-03-07 DIAGNOSIS — Z87898 Personal history of other specified conditions: Secondary | ICD-10-CM | POA: Diagnosis not present

## 2023-03-07 DIAGNOSIS — I951 Orthostatic hypotension: Secondary | ICD-10-CM | POA: Diagnosis not present

## 2023-03-08 ENCOUNTER — Encounter: Payer: Self-pay | Admitting: Rehabilitative and Restorative Service Providers"

## 2023-03-08 ENCOUNTER — Ambulatory Visit: Payer: PPO | Admitting: Rehabilitative and Restorative Service Providers"

## 2023-03-08 DIAGNOSIS — M25551 Pain in right hip: Secondary | ICD-10-CM | POA: Diagnosis not present

## 2023-03-08 DIAGNOSIS — M546 Pain in thoracic spine: Secondary | ICD-10-CM

## 2023-03-08 DIAGNOSIS — M5459 Other low back pain: Secondary | ICD-10-CM | POA: Diagnosis not present

## 2023-03-08 DIAGNOSIS — M25552 Pain in left hip: Secondary | ICD-10-CM

## 2023-03-08 DIAGNOSIS — R2689 Other abnormalities of gait and mobility: Secondary | ICD-10-CM

## 2023-03-08 LAB — COMPREHENSIVE METABOLIC PANEL
ALT: 11 [IU]/L (ref 0–32)
AST: 18 [IU]/L (ref 0–40)
Albumin: 3.9 g/dL (ref 3.8–4.8)
Alkaline Phosphatase: 100 [IU]/L (ref 44–121)
BUN/Creatinine Ratio: 25 (ref 12–28)
BUN: 21 mg/dL (ref 8–27)
Bilirubin Total: 0.3 mg/dL (ref 0.0–1.2)
CO2: 26 mmol/L (ref 20–29)
Calcium: 9.2 mg/dL (ref 8.7–10.3)
Chloride: 105 mmol/L (ref 96–106)
Creatinine, Ser: 0.85 mg/dL (ref 0.57–1.00)
Globulin, Total: 2.3 g/dL (ref 1.5–4.5)
Glucose: 96 mg/dL (ref 70–99)
Potassium: 5 mmol/L (ref 3.5–5.2)
Sodium: 144 mmol/L (ref 134–144)
Total Protein: 6.2 g/dL (ref 6.0–8.5)
eGFR: 70 mL/min/{1.73_m2} (ref >59)

## 2023-03-08 LAB — CBC
Hematocrit: 38.5 % (ref 34.0–46.6)
Hemoglobin: 12.3 g/dL (ref 11.1–15.9)
MCH: 28.4 pg (ref 26.6–33.0)
MCHC: 31.9 g/dL (ref 31.5–35.7)
MCV: 89 fL (ref 79–97)
Platelets: 284 10*3/uL (ref 150–450)
RBC: 4.33 x10E6/uL (ref 3.77–5.28)
RDW: 12.3 % (ref 11.7–15.4)
WBC: 8.3 10*3/uL (ref 3.4–10.8)

## 2023-03-08 LAB — TSH: TSH: 2.33 u[IU]/mL (ref 0.450–4.500)

## 2023-03-08 LAB — LIPID PANEL
Chol/HDL Ratio: 2 {ratio} (ref 0.0–4.4)
Cholesterol, Total: 180 mg/dL (ref 100–199)
HDL: 92 mg/dL (ref >39)
LDL Chol Calc (NIH): 73 mg/dL (ref 0–99)
Triglycerides: 83 mg/dL (ref 0–149)
VLDL Cholesterol Cal: 15 mg/dL (ref 5–40)

## 2023-03-08 NOTE — Therapy (Signed)
 OUTPATIENT PHYSICAL THERAPY TREATMENT  Patient Name: Bonnie Juarez MRN: [id number] DOB:Oct 01, 1945, 78 y.o., female Today's Date: 03/08/2023  END OF SESSION:  PT End of Session - 03/08/23 1504     Visit Number 3    Number of Visits 15    Date for PT Re-Evaluation 04/11/23    Progress Note Due on Visit 10    PT Start Time 1430    PT Stop Time 1512    PT Time Calculation (min) 42 min    Activity Tolerance Patient limited by pain;Patient limited by fatigue    Behavior During Therapy [facility] for tasks assessed/performed              Past Medical History:  Diagnosis Date   Anxiety    Arthritis    Cancer (HCC)    Hx: of squamouscell carcinoma on Left shin basal cell on face; melanoma on right lower leg   Complication of anesthesia    11-23-2003 deceased LOC and hypoxia overmedicating with Morphine  and Phenergan; received Narcan X 2, overnight pulse ox   Depression    Fibromyalgia    Frequent urination    GERD (gastroesophageal reflux disease)    Hx: of   H/O hiatal hernia    PMH: of   Headache(784.0)    Hypertension    IBS (irritable bowel syndrome)    Hx: of   Melanoma (HCC)    Right leg   Osteopenia    Hx: of per pt, pt. adds osteoporosis- DOS   Pneumonia    hx   PONV (postoperative nausea and vomiting)    Past Surgical History:  Procedure Laterality Date   APPENDECTOMY     ARCUATE KERATECTOMY     BREAST LUMPECTOMY Right 1991   CERVICAL FUSION  1983   FRACTURE SURGERY Right 2014   humerus   HERNIA REPAIR     hiatal   KNEE ARTHROSCOPY Right 2015   NICEN FUNDO PLACATION  2010   NISSEN FUNDOPLICATION N/A 04/13/2020   Procedure: EXPLORATION OF FORGUT WITH ENDOSCOPY AND HITAL HERNIA CLOSURE, UPPER ENDOSCOPY;  Surgeon: Gladis Cough, MD;  Location: [facility];  Service: General;  Laterality: N/A;   ORIF HUMERUS FRACTURE Right 01/08/2013   Procedure: OPEN REDUCTION INTERNAL FIXATION (ORIF) PROXIMAL HUMERUS FRACTURE;  Surgeon: Oneil JAYSON Herald, MD;  Location: [facility] OR;  Service:  Orthopedics;  Laterality: Right;  Open Reduction Internal Fixation Right Proximal Humerus Fracture   POSTERIOR CERVICAL FUSION/FORAMINOTOMY N/A 12/20/2016   Procedure: Posterior Cervical Fusion with lateral mass fixation - Cervical Three-Thoracic Two;  Surgeon: Joshua Alm RAMAN, MD;  Location: [facility] OR;  Service: Neurosurgery;  Laterality: N/A;   RADIAL HEAD REPLACEMENT  2005   trigger thrumb release Left 2001   tubular plasity  1970   XI ROBOTIC ASSISTED HIATAL HERNIA REPAIR N/A 12/06/2020   Procedure: XI ROBOTIC ASSISTED REDO HIATAL HERNIA REPAIR;  Surgeon: Gladis Cough, MD;  Location: [facility];  Service: General;  Laterality: N/A;   Patient Active Problem List   Diagnosis Date Noted   Gastroparesis 12/06/2020   History of repair of hiatal hernia 04/13/2020   Plantar fasciitis of left foot 10/02/2018   S/P lumbar fusion 02/06/2018   Mixed dyslipidemia 11/13/2017   Family history of heart disease 11/13/2017   Statin intolerance 11/13/2017   Bilateral primary osteoarthritis of knee 10/30/2017   S/P cervical spinal fusion 12/20/2016   Spondylosis without myelopathy or radiculopathy, lumbar region 10/05/2016   Chronic bilateral low back pain without sciatica 10/05/2016  Fibromyalgia 10/05/2016   Closed fracture of right proximal humerus 01/08/2013    Class: Acute    PCP: Aisha Harvey, MD   REFERRING PROVIDER: Barbarann Oneil BROCKS, MD   REFERRING DIAG: [id number] (ICD-10-CM) - Bilateral hip pain  Rationale for Evaluation and Treatment: Rehabilitation  THERAPY DIAG:  Pain in left hip  Pain in right hip  Other low back pain  Pain in thoracic spine  Other abnormalities of gait and mobility  ONSET DATE: Chronic pain  SUBJECTIVE:                                                                                                                                                                                           SUBJECTIVE STATEMENT: Pt indicated having a sinus infection  that may have been impacting balance.  Pt indicated feeling some improvement in flexibility.  Pt indicated pain is not bad, just stiffness. Taking extra strength tylenol  at bedtime.   PERTINENT HISTORY:   Low back pain, fusion L4-5 in 2019, disc protrusions L2-3 and L3-4 PMH: lumbar and cervical fusion, fibromyalgia,knee OA,  PAIN:  NPRS scale: at worst in last few days not too bad didn't indicate a number.  Pain location:back and down her legs Pain description: constant but the intense pain comes and goes Aggravating factors: turning her head, standing,  Relieving factors: rest, meds  PRECAUTIONS: None  RED FLAGS: None   WEIGHT BEARING RESTRICTIONS: No  FALLS:  Has patient fallen in last 6 months? Yes. Number of falls 1, tripped over bedding   PLOF: Independent with basic ADLs  PATIENT GOALS: reduce pain, improve activity  NEXT MD VISIT: nothing scheduled at eval  OBJECTIVE:  Note: Objective measures were completed at Evaluation unless otherwise noted.  DIAGNOSTIC FINDINGS:  XR in chart  PATIENT SURVEYS:  Eval: FOTO 61% functional, goal is 66%  COGNITION: 02/14/2023 Overall cognitive status: Within functional limits for tasks assessed     SENSATION: 02/14/2023 [facility]  MUSCLE LENGTH: 02/14/2023 Hamstrings: tight bilat Thomas test: tight bilat  LUMBAR ROM:   AROM 02/14/2023 Held updates today due to time to discuss grief  Flexion 50%   Extension 50%   Right lateral flexion    Left lateral flexion    Right rotation 25%   Left rotation 25%    (Blank rows = not tested)  LOWER EXTREMITY ROM:     Active  Right eval Left eval  Hip flexion    Hip extension    Hip abduction    Hip adduction    Hip internal rotation    Hip external rotation    Knee flexion    Knee  extension    Ankle dorsiflexion    Ankle plantarflexion    Ankle inversion    Ankle eversion     (Blank rows = not tested)  LOWER EXTREMITY MMT:    MMT Right 02/14/2023  Left 02/14/2023  Hip flexion [facility]  Hip extension    Hip abduction [facility]  Hip adduction    Hip internal rotation    Hip external rotation    Knee flexion [facility] WFL  Knee extension [facility] WFL  Ankle dorsiflexion    Ankle plantarflexion    Ankle inversion    Ankle eversion     (Blank rows = not tested)  FUNCTIONAL TESTS:  02/14/2023 Timed up and go (TUG): 17:01 sec                  TODAY'S TREATMENT:                                                                 DATE:  03/08/2023 Therex: Nustep lvl 6 10 mins UE/LE  Standing hamstring stretch with front leg on 6 inch step in // bars 30 sec x 2 bilateral  Standing marching with bilateral hands on bars x 20 each LE Standing hip abduction with bilateral hands on bars x 20 each side Standing hip extension with bilateral hands on bars x 20 each side Lateral stepping in // bars with bilateral light hand assist on anterior bar 10 ft x 3 each way  Supine hooklying ab bracing c verbal cues/tactile for activation 5 sec hold x10 Posterior pelvic tilt c verbal cues/tactile 5 sec hold x10 Supine hooklying red pball under feet with lateral trunk rotation and cues for core activation 2 x10 bilateral Supine hooklying  hip adductor ball squeeze 2-3 sec 2x 10    TODAY'S TREATMENT:                                                                 DATE: 03/06/2023 Nu step L5 UE/LE X 10 min Alternating unilat UE rows red X 15 each Shoulder extensions red X 15 bilat Standing lumbar L stretch 5 sec X 10 Sidestepping 3 round trips at counter without UE support Standing hip extensions X 20 bilat Standing marches X 20 bilat Tandem balance 30 sec X 3 bilat Cervical rotation stretch AAROM with towel 5 sec X 10 bilat   PATIENT EDUCATION: Education details: HEP, PT plan of care Person educated: Patient Education method: Explanation, Demonstration, Verbal cues, and Handouts Education comprehension: verbalized understanding and needs further  education   HOME EXERCISE PROGRAM: Access Code: [email or web address] URL: [email or web address] Date: 02/14/2023 Prepared by: Redell Moose  Exercises - Standing 'L' Stretch at Counter  - 2 x daily - 6 x weekly - 1 sets - 10 reps - 5 hold - Side Stepping with Counter Support  - 2 x daily - 6 x weekly - 1 sets - 5 reps - alternating marching  - 2 x daily - 6 x weekly - 1 sets - 15-20 reps - Standing Hip Extension with Counter  Support  - 2 x daily - 6 x weekly - 1 sets - 15-20 reps - Standing Tandem Balance with Counter Support  - 2 x daily - 6 x weekly - 1 sets - 3 reps - 30 hold - Standing Scapular Retraction with External Rotation  - 2 x daily - 6 x weekly - 1 sets - 10 reps - 5 sec hold - Standing Lumbar Extension  - 2 x daily - 6 x weekly - 1 sets - 10 reps - 5 sec hold - Seated Assisted Cervical Rotation with Towel  - 2 x daily - 6 x weekly - 1 sets - 10 reps - 5 hold  ASSESSMENT:  CLINICAL IMPRESSION:  Fatigue more noted from Lt side in activity in standing and movement.  Complaints for back/Lt side were also noted in some standing hip extension movement.    OBJECTIVE IMPAIRMENTS: decreased activity tolerance, difficulty walking, decreased balance, decreased endurance, decreased mobility, decreased ROM, decreased strength, impaired flexibility, impaired UE/LE use, postural dysfunction, and pain.  ACTIVITY LIMITATIONS: bending, lifting, carry, locomotion, cleaning, community activity, driving  PERSONAL FACTORS: see above PMH are also affecting patient's functional outcome.  REHAB POTENTIAL: Good  CLINICAL DECISION MAKING: Stable/uncomplicated  EVALUATION COMPLEXITY: Low    GOALS: Short term PT Goals Target date: 03/14/2023   Pt will be I and compliant with HEP. Baseline:  Goal status: on going 03/08/2023  Pt will decrease pain by 25% overall Baseline: Goal status:  on going 03/08/2023  Long term PT goals Target date:04/11/2023   Pt will improve ROM to [facility] to  improve functional mobility Baseline: Goal status: New Pt will improve  hip/knee strength to at least 5-/5 MMT to improve functional strength Baseline: Goal status: New Pt will improve FOTO to at least % functional to show improved function Baseline: Goal status: New Pt will reduce pain by overall 50% overall with usual activity Baseline: Goal status: New Pt will reduce pain to overall less than 2-3/10 with usual activity and work activity. Baseline: Goal status: New Pt will be able to ambulate community distances at least 1000 ft WNL gait pattern without complaints Baseline: Goal status: New  PLAN: PT FREQUENCY: 1-2 times per week   PT DURATION: 6-8 weeks  PLANNED INTERVENTIONS (unless contraindicated): aquatic PT, Canalith repositioning, cryotherapy, Electrical stimulation, Iontophoresis with 4 mg/ml dexamethasome, Moist heat, traction, Ultrasound, gait training, Therapeutic exercise, balance training, neuromuscular re-education, patient/family education,  manual techniques, passive ROM, dry needling, taping, vasopnuematic device, vestibular, spinal manipulations, joint manipulations 97110-Therapeutic exercises, 97530- Therapeutic activity, [id number]- Neuromuscular re-education, 97535- Self Care, [id number]- Manual therapy, [id number]- Gait training, and [phone]- Aquatic Therapy  PLAN FOR NEXT SESSION:  Continued progress mobility gains for lumbar and LE with core/hip strengthening as tolerated to promote activity tolerance.     This entire session of physical therapy was performed under the direct supervision of PT signing evaluation /treatment. PT reviewed note and agrees.   Ozell Silvan, PT, DPT, OCS, ATC 03/08/23  4:08 PM

## 2023-03-09 ENCOUNTER — Ambulatory Visit: Payer: PPO | Admitting: Nurse Practitioner

## 2023-03-13 ENCOUNTER — Encounter: Payer: PPO | Admitting: Rehabilitative and Restorative Service Providers"

## 2023-03-15 ENCOUNTER — Ambulatory Visit: Payer: PPO | Admitting: Rehabilitative and Restorative Service Providers"

## 2023-03-15 ENCOUNTER — Encounter: Payer: Self-pay | Admitting: Rehabilitative and Restorative Service Providers"

## 2023-03-15 DIAGNOSIS — M25551 Pain in right hip: Secondary | ICD-10-CM

## 2023-03-15 DIAGNOSIS — M5459 Other low back pain: Secondary | ICD-10-CM

## 2023-03-15 DIAGNOSIS — M25552 Pain in left hip: Secondary | ICD-10-CM

## 2023-03-15 DIAGNOSIS — R2689 Other abnormalities of gait and mobility: Secondary | ICD-10-CM | POA: Diagnosis not present

## 2023-03-15 DIAGNOSIS — M546 Pain in thoracic spine: Secondary | ICD-10-CM | POA: Diagnosis not present

## 2023-03-15 NOTE — Therapy (Signed)
OUTPATIENT PHYSICAL THERAPY TREATMENT  Patient Name: Bonnie Juarez MRN: [id number] DOB:07/06/1945, 78 y.o., female Today's Date: 03/15/2023  END OF SESSION:  PT End of Session - 03/15/23 1706     Visit Number 4    Number of Visits 15    Date for PT Re-Evaluation 04/11/23    Progress Note Due on Visit 10    PT Start Time 1430    PT Stop Time 1513    PT Time Calculation (min) 43 min    Activity Tolerance Patient tolerated treatment well    Behavior During Therapy WFL for tasks assessed/performed               Past Medical History:  Diagnosis Date   Anxiety    Arthritis    Cancer (HCC)    Hx: of squamouscell carcinoma on Left shin basal cell on face; melanoma on right lower leg   Complication of anesthesia    12-13-03 deceased LOC and hypoxia overmedicating with Morphine and Phenergan; received Narcan X 2, overnight pulse ox   Depression    Fibromyalgia    Frequent urination    GERD (gastroesophageal reflux disease)    Hx: of   H/O hiatal hernia    PMH: of   Headache(784.0)    Hypertension    IBS (irritable bowel syndrome)    Hx: of   Melanoma (HCC)    Right leg   Osteopenia    Hx: of per pt, pt. adds osteoporosis- DOS   Pneumonia    hx   PONV (postoperative nausea and vomiting)    Past Surgical History:  Procedure Laterality Date   APPENDECTOMY     ARCUATE KERATECTOMY     BREAST LUMPECTOMY Right 1991   CERVICAL FUSION  1983   FRACTURE SURGERY Right 2014   humerus   HERNIA REPAIR     hiatal   KNEE ARTHROSCOPY Right 2015   NICEN FUNDO PLACATION  2010   NISSEN FUNDOPLICATION N/A 04/13/2020   Procedure: EXPLORATION OF FORGUT WITH ENDOSCOPY AND HITAL HERNIA CLOSURE, UPPER ENDOSCOPY;  Surgeon: Luretha Murphy, MD;  Location: [facility];  Service: General;  Laterality: N/A;   ORIF HUMERUS FRACTURE Right 01/08/2013   Procedure: OPEN REDUCTION INTERNAL FIXATION (ORIF) PROXIMAL HUMERUS FRACTURE;  Surgeon: Eldred Manges, MD;  Location: [facility] OR;  Service: Orthopedics;   Laterality: Right;  Open Reduction Internal Fixation Right Proximal Humerus Fracture   POSTERIOR CERVICAL FUSION/FORAMINOTOMY N/A 12/20/2016   Procedure: Posterior Cervical Fusion with lateral mass fixation - Cervical Three-Thoracic Two;  Surgeon: Tia Alert, MD;  [facility];  Service: Neurosurgery;  Laterality: N/A;   RADIAL HEAD REPLACEMENT  2005   trigger thrumb release Left 2001   tubular plasity  1970   XI ROBOTIC ASSISTED HIATAL HERNIA REPAIR N/A 12/06/2020   Procedure: XI ROBOTIC ASSISTED REDO HIATAL HERNIA REPAIR;  Surgeon: Luretha Murphy, MD;  Location: [facility];  Service: General;  Laterality: N/A;   Patient Active Problem List   Diagnosis Date Noted   Gastroparesis 12/06/2020   History of repair of hiatal hernia 04/13/2020   Plantar fasciitis of left foot 10/02/2018   S/P lumbar fusion 02/06/2018   Mixed dyslipidemia 11/13/2017   Family history of heart disease 11/13/2017   Statin intolerance 11/13/2017   Bilateral primary osteoarthritis of knee 10/30/2017   S/P cervical spinal fusion 12/20/2016   Spondylosis without myelopathy or radiculopathy, lumbar region 10/05/2016   Chronic bilateral low back pain without sciatica 10/05/2016   Fibromyalgia 10/05/2016  Closed fracture of right proximal humerus 01/08/2013    Class: Acute    PCP: Gweneth Dimitri, MD   REFERRING PROVIDER: Eldred Manges, MD   REFERRING DIAG: [phone] (ICD-10-CM) - Bilateral hip pain  Rationale for Evaluation and Treatment: Rehabilitation  THERAPY DIAG:  Pain in left hip  Pain in right hip  Pain in thoracic spine  Other abnormalities of gait and mobility  Other low back pain  ONSET DATE: Chronic pain  SUBJECTIVE:                                                                                                                                                                                           SUBJECTIVE STATEMENT: Since last time, patient states that she had a lot of  calf soreness which resolved after a few days of rest. States that she feels like she has more energy today than in previous sessions. Has been able to do more household chores.   PERTINENT HISTORY:   Low back pain, fusion L4-5 in 2019, disc protrusions L2-3 and L3-4 PMH: lumbar and cervical fusion, fibromyalgia,knee OA,  PAIN:  NPRS scale: no pain just tightness in neck Pain location:back and down her legs Pain description: constant but the intense pain comes and goes Aggravating factors: turning her head, standing,  Relieving factors: rest, meds  PRECAUTIONS: None  RED FLAGS: None   WEIGHT BEARING RESTRICTIONS: No  FALLS:  Has patient fallen in last 6 months? Yes. Number of falls 1, tripped over bedding   PLOF: Independent with basic ADLs  PATIENT GOALS: reduce pain, improve activity  NEXT MD VISIT: nothing scheduled at eval  OBJECTIVE:  Note: Objective measures were completed at Evaluation unless otherwise noted.  DIAGNOSTIC FINDINGS:  XR in chart  PATIENT SURVEYS:  Eval: FOTO 61% functional, goal is 66%  COGNITION: 02/14/2023 Overall cognitive status: Within functional limits for tasks assessed     SENSATION: 02/14/2023 [facility]  MUSCLE LENGTH: 02/14/2023 Hamstrings: tight bilat Thomas test: tight bilat  LUMBAR ROM:   AROM 02/14/2023 Held updates today due to time to discuss grief  Flexion 50%   Extension 50%   Right lateral flexion    Left lateral flexion    Right rotation 25%   Left rotation 25%    (Blank rows = not tested)  LOWER EXTREMITY ROM:     Active  Right eval Left eval  Hip flexion    Hip extension    Hip abduction    Hip adduction    Hip internal rotation    Hip external rotation    Knee flexion    Knee extension  Ankle dorsiflexion    Ankle plantarflexion    Ankle inversion    Ankle eversion     (Blank rows = not tested)  LOWER EXTREMITY MMT:    MMT Right 02/14/2023 Left 02/14/2023  Hip flexion [facility]  Hip extension     Hip abduction [facility]  Hip adduction    Hip internal rotation    Hip external rotation    Knee flexion [facility] WFL  Knee extension [facility] WFL  Ankle dorsiflexion    Ankle plantarflexion    Ankle inversion    Ankle eversion     (Blank rows = not tested)  FUNCTIONAL TESTS:  02/14/2023 Timed up and go (TUG): 17:01 sec                 TODAY'S TREATMENT:                                                                 DATE:  03/15/2023 Therex: Bike level 1; required brief stop due to quad cramp Gastroc stretc 2x30sec Standing hamstring stretch with front leg on 6 inch step in // bars 30 sec x 2 bilateral  Standing marching without hands x 20 each LE Standing glute kicks 2x10ea varied UE assist (heavy HHA with fatigue) Standing hip abduction with bilateral hands on bars x 20 each side Standing hip extension with bilateral hands on bars x 20 each side Seated upper trap stretch 3x20sec ea side    TODAY'S TREATMENT:                                                                 DATE:  03/13/2023 Therex: Nustep lvl 6 10 mins UE/LE  Standing hamstring stretch with front leg on 6 inch step in // bars 30 sec x 2 bilateral  Standing marching with bilateral hands on bars x 20 each LE Standing hip abduction with bilateral hands on bars x 20 each side Standing hip extension with bilateral hands on bars x 20 each side Lateral stepping in // bars with bilateral light hand assist on anterior bar 10 ft x 3 each way  Supine hooklying ab bracing c verbal cues/tactile for activation 5 sec hold x10 Posterior pelvic tilt c verbal cues/tactile 5 sec hold x10   TODAY'S TREATMENT:                                                                 DATE:  03/08/2023 Therex: Nustep lvl 6 10 mins UE/LE  Standing hamstring stretch with front leg on 6 inch step in // bars 30 sec x 2 bilateral  Standing marching with bilateral hands on bars x 20 each LE Standing hip abduction with bilateral hands on bars x 20  each side Standing hip extension with bilateral hands on bars x 20 each side  Lateral stepping in // bars with bilateral light hand assist on anterior bar 10 ft x 3 each way  Supine hooklying ab bracing c verbal cues/tactile for activation 5 sec hold x10 Posterior pelvic tilt c verbal cues/tactile 5 sec hold x10 Supine hooklying red pball under feet with lateral trunk rotation and cues for core activation 2 x10 bilateral Supine hooklying  hip adductor ball squeeze 2-3 sec 2x 10    TODAY'S TREATMENT:                                                                 DATE: 03/06/2023 Nu step L5 UE/LE X 10 min Alternating unilat UE rows red X 15 each Shoulder extensions red X 15 bilat Standing lumbar L stretch 5 sec X 10 Sidestepping 3 round trips at counter without UE support Standing hip extensions X 20 bilat Standing marches X 20 bilat Tandem balance 30 sec X 3 bilat Cervical rotation stretch AAROM with towel 5 sec X 10 bilat   PATIENT EDUCATION: Education details: HEP, PT plan of care Person educated: Patient Education method: Explanation, Demonstration, Verbal cues, and Handouts Education comprehension: verbalized understanding and needs further education   HOME EXERCISE PROGRAM: Access Code: [email or web address] URL: [email or web address] Date: 02/14/2023 Prepared by: Ivery Quale  Exercises - Standing 'L' Stretch at Counter  - 2 x daily - 6 x weekly - 1 sets - 10 reps - 5 hold - Side Stepping with Counter Support  - 2 x daily - 6 x weekly - 1 sets - 5 reps - alternating marching  - 2 x daily - 6 x weekly - 1 sets - 15-20 reps - Standing Hip Extension with Counter Support  - 2 x daily - 6 x weekly - 1 sets - 15-20 reps - Standing Tandem Balance with Counter Support  - 2 x daily - 6 x weekly - 1 sets - 3 reps - 30 hold - Standing Scapular Retraction with External Rotation  - 2 x daily - 6 x weekly - 1 sets - 10 reps - 5 sec hold - Standing Lumbar Extension  - 2 x daily - 6 x  weekly - 1 sets - 10 reps - 5 sec hold - Seated Assisted Cervical Rotation with Towel  - 2 x daily - 6 x weekly - 1 sets - 10 reps - 5 hold  ASSESSMENT:  CLINICAL IMPRESSION:  Patient did well with activity today and was able to reduce amount of HHA needed for standing    OBJECTIVE IMPAIRMENTS: decreased activity tolerance, difficulty walking, decreased balance, decreased endurance, decreased mobility, decreased ROM, decreased strength, impaired flexibility, impaired UE/LE use, postural dysfunction, and pain.  ACTIVITY LIMITATIONS: bending, lifting, carry, locomotion, cleaning, community activity, driving  PERSONAL FACTORS: see above PMH are also affecting patient's functional outcome.  REHAB POTENTIAL: Good  CLINICAL DECISION MAKING: Stable/uncomplicated  EVALUATION COMPLEXITY: Low    GOALS: Short term PT Goals Target date: 03/14/2023   Pt will be I and compliant with HEP. Baseline:  Goal status: on going 03/08/2023  Pt will decrease pain by 25% overall Baseline: Goal status:  on going 03/08/2023  Long term PT goals Target date:04/11/2023   Pt will improve ROM to [facility] to improve functional mobility Baseline: Goal status: New Pt will improve  hip/knee strength to at least 5-/5 MMT to improve functional strength Baseline: Goal status: New Pt will improve FOTO to at least % functional to show improved function Baseline: Goal status: New Pt will reduce pain by overall 50% overall with usual activity Baseline: Goal status: New Pt will reduce pain to overall less than 2-3/10 with usual activity and work activity. Baseline: Goal status: New Pt will be able to ambulate community distances at least 1000 ft WNL gait pattern without complaints Baseline: Goal status: New  PLAN: PT FREQUENCY: 1-2 times per week   PT DURATION: 6-8 weeks  PLANNED INTERVENTIONS (unless contraindicated): aquatic PT, Canalith repositioning, cryotherapy, Electrical stimulation, Iontophoresis with  4 mg/ml dexamethasome, Moist heat, traction, Ultrasound, gait training, Therapeutic exercise, balance training, neuromuscular re-education, patient/family education,  manual techniques, passive ROM, dry needling, taping, vasopnuematic device, vestibular, spinal manipulations, joint manipulations 97110-Therapeutic exercises, 97530- Therapeutic activity, [id number]- Neuromuscular re-education, 97535- Self Care, [id number]- Manual therapy, [id number]- Gait training, and [phone]- Aquatic Therapy  PLAN FOR NEXT SESSION:  Continue balance, core strengthening, and stretching for LE and upper trap.     Lesbia Ottaway, Magon Croson, Student-PT 03/15/2023, 5:09 PM

## 2023-03-20 ENCOUNTER — Ambulatory Visit: Payer: PPO | Admitting: Physical Therapy

## 2023-03-20 ENCOUNTER — Encounter: Payer: Self-pay | Admitting: Physical Therapy

## 2023-03-20 DIAGNOSIS — M546 Pain in thoracic spine: Secondary | ICD-10-CM | POA: Diagnosis not present

## 2023-03-20 DIAGNOSIS — M5459 Other low back pain: Secondary | ICD-10-CM

## 2023-03-20 DIAGNOSIS — M25551 Pain in right hip: Secondary | ICD-10-CM | POA: Diagnosis not present

## 2023-03-20 DIAGNOSIS — R2689 Other abnormalities of gait and mobility: Secondary | ICD-10-CM

## 2023-03-20 DIAGNOSIS — M25552 Pain in left hip: Secondary | ICD-10-CM | POA: Diagnosis not present

## 2023-03-20 NOTE — Therapy (Signed)
OUTPATIENT PHYSICAL THERAPY TREATMENT  Patient Name: Bonnie Juarez MRN: [id number] DOB:09/03/1945, 78 y.o., female Today's Date: 03/20/2023  END OF SESSION:  PT End of Session - 03/20/23 1434     Visit Number 5    Number of Visits 15    Date for PT Re-Evaluation 04/11/23    Progress Note Due on Visit 10    PT Start Time 1422    PT Stop Time 1500    PT Time Calculation (min) 38 min    Activity Tolerance Patient tolerated treatment well    Behavior During Therapy WFL for tasks assessed/performed               Past Medical History:  Diagnosis Date   Anxiety    Arthritis    Cancer (HCC)    Hx: of squamouscell carcinoma on Left shin basal cell on face; melanoma on right lower leg   Complication of anesthesia    2003/11/29 deceased LOC and hypoxia overmedicating with Morphine and Phenergan; received Narcan X 2, overnight pulse ox   Depression    Fibromyalgia    Frequent urination    GERD (gastroesophageal reflux disease)    Hx: of   H/O hiatal hernia    PMH: of   Headache(784.0)    Hypertension    IBS (irritable bowel syndrome)    Hx: of   Melanoma (HCC)    Right leg   Osteopenia    Hx: of per pt, pt. adds osteoporosis- DOS   Pneumonia    hx   PONV (postoperative nausea and vomiting)    Past Surgical History:  Procedure Laterality Date   APPENDECTOMY     ARCUATE KERATECTOMY     BREAST LUMPECTOMY Right 1991   CERVICAL FUSION  1983   FRACTURE SURGERY Right 2014   humerus   HERNIA REPAIR     hiatal   KNEE ARTHROSCOPY Right 2015   NICEN FUNDO PLACATION  2010   NISSEN FUNDOPLICATION N/A 04/13/2020   Procedure: EXPLORATION OF FORGUT WITH ENDOSCOPY AND HITAL HERNIA CLOSURE, UPPER ENDOSCOPY;  Surgeon: Luretha Murphy, MD;  Location: [facility];  Service: General;  Laterality: N/A;   ORIF HUMERUS FRACTURE Right 01/08/2013   Procedure: OPEN REDUCTION INTERNAL FIXATION (ORIF) PROXIMAL HUMERUS FRACTURE;  Surgeon: Eldred Manges, MD;  Location: [facility] OR;  Service: Orthopedics;   Laterality: Right;  Open Reduction Internal Fixation Right Proximal Humerus Fracture   POSTERIOR CERVICAL FUSION/FORAMINOTOMY N/A 12/20/2016   Procedure: Posterior Cervical Fusion with lateral mass fixation - Cervical Three-Thoracic Two;  Surgeon: Tia Alert, MD;  Location: [facility] OR;  Service: Neurosurgery;  Laterality: N/A;   RADIAL HEAD REPLACEMENT  2005   trigger thrumb release Left 2001   tubular plasity  1970   XI ROBOTIC ASSISTED HIATAL HERNIA REPAIR N/A 12/06/2020   Procedure: XI ROBOTIC ASSISTED REDO HIATAL HERNIA REPAIR;  Surgeon: Luretha Murphy, MD;  Location: [facility];  Service: General;  Laterality: N/A;   Patient Active Problem List   Diagnosis Date Noted   Gastroparesis 12/06/2020   History of repair of hiatal hernia 04/13/2020   Plantar fasciitis of left foot 10/02/2018   S/P lumbar fusion 02/06/2018   Mixed dyslipidemia 11/13/2017   Family history of heart disease 11/13/2017   Statin intolerance 11/13/2017   Bilateral primary osteoarthritis of knee 10/30/2017   S/P cervical spinal fusion 12/20/2016   Spondylosis without myelopathy or radiculopathy, lumbar region 10/05/2016   Chronic bilateral low back pain without sciatica 10/05/2016   Fibromyalgia 10/05/2016  Closed fracture of right proximal humerus 01/08/2013    Class: Acute    PCP: Gweneth Dimitri, MD   REFERRING PROVIDER: Eldred Manges, MD   REFERRING DIAG: [phone] (ICD-10-CM) - Bilateral hip pain  Rationale for Evaluation and Treatment: Rehabilitation  THERAPY DIAG:  Pain in left hip  Pain in right hip  Pain in thoracic spine  Other abnormalities of gait and mobility  Other low back pain  ONSET DATE: Chronic pain  SUBJECTIVE:                                                                                                                                                                                           SUBJECTIVE STATEMENT: Since last time, patient states that she had a lot of  calf soreness which resolved after a few days of rest. States that she feels like she has more energy today than in previous sessions. Has been able to do more household chores.   PERTINENT HISTORY:   Low back pain, fusion L4-5 in 2019, disc protrusions L2-3 and L3-4 PMH: lumbar and cervical fusion, fibromyalgia,knee OA,  PAIN:  NPRS scale: no pain just tightness in neck Pain location:back and down her legs Pain description: constant but the intense pain comes and goes Aggravating factors: turning her head, standing,  Relieving factors: rest, meds  PRECAUTIONS: None  RED FLAGS: None   WEIGHT BEARING RESTRICTIONS: No  FALLS:  Has patient fallen in last 6 months? Yes. Number of falls 1, tripped over bedding   PLOF: Independent with basic ADLs  PATIENT GOALS: reduce pain, improve activity  NEXT MD VISIT: nothing scheduled at eval  OBJECTIVE:  Note: Objective measures were completed at Evaluation unless otherwise noted.  DIAGNOSTIC FINDINGS:  XR in chart  PATIENT SURVEYS:  Eval: FOTO 61% functional, goal is 66%  COGNITION: 02/14/2023 Overall cognitive status: Within functional limits for tasks assessed     SENSATION: 02/14/2023 [facility]  MUSCLE LENGTH: 02/14/2023 Hamstrings: tight bilat Thomas test: tight bilat  LUMBAR ROM:   AROM 02/14/2023 Held updates today due to time to discuss grief  Flexion 50%   Extension 50%   Right lateral flexion    Left lateral flexion    Right rotation 25%   Left rotation 25%    (Blank rows = not tested)  LOWER EXTREMITY ROM:     Active  Right eval Left eval  Hip flexion    Hip extension    Hip abduction    Hip adduction    Hip internal rotation    Hip external rotation    Knee flexion    Knee extension  Ankle dorsiflexion    Ankle plantarflexion    Ankle inversion    Ankle eversion     (Blank rows = not tested)  LOWER EXTREMITY MMT:    MMT Right 02/14/2023 Left 02/14/2023  Hip flexion [facility]  Hip extension     Hip abduction [facility]  Hip adduction    Hip internal rotation    Hip external rotation    Knee flexion [facility] WFL  Knee extension [facility] WFL  Ankle dorsiflexion    Ankle plantarflexion    Ankle inversion    Ankle eversion     (Blank rows = not tested)  FUNCTIONAL TESTS:  02/14/2023 Timed up and go (TUG): 17:01 sec                 TODAY'S TREATMENT:                                                                  DATE: 03/20/2023 Precor bike L1 X 8 min Slantboard stretch 30 sec X 3 Leg press DL 57# [id number], then 62# SL X 10 each side Alternating unilat UE rows red X 15 each Standing lumbar L stretch 5 sec X 10 Sidestepping 3 round trips at counter without UE support Standing hip extensions X 10 bilat Standing marches X 10 bilat Standing hip abduction X 10 bilat Tandem balance 30 sec X 3 bilat Cervical rotation stretch AAROM with towel 5 sec X 5 bilat Upper trap stretch 3 X 20 sec bilat  DATE:  03/15/2023 Therex: Bike level 1; required brief stop due to quad cramp Gastroc stretc 2x30sec Standing hamstring stretch with front leg on 6 inch step in // bars 30 sec x 2 bilateral  Standing marching without hands x 20 each LE Standing glute kicks 2x10ea varied UE assist (heavy HHA with fatigue) Standing hip abduction with bilateral hands on bars x 20 each side Standing hip extension with bilateral hands on bars x 20 each side Seated upper trap stretch 3x20sec ea side    TODAY'S TREATMENT:                                                                 DATE:  03/13/2023 Therex: Nustep lvl 6 10 mins UE/LE  Standing hamstring stretch with front leg on 6 inch step in // bars 30 sec x 2 bilateral  Standing marching with bilateral hands on bars x 20 each LE Standing hip abduction with bilateral hands on bars x 20 each side Standing hip extension with bilateral hands on bars x 20 each side Lateral stepping in // bars with bilateral light hand assist on anterior bar 10 ft x 3 each  way  Supine hooklying ab bracing c verbal cues/tactile for activation 5 sec hold x10 Posterior pelvic tilt c verbal cues/tactile 5 sec hold x10      PATIENT EDUCATION: Education details: HEP, PT plan of care Person educated: Patient Education method: Explanation, Demonstration, Verbal cues, and Handouts Education comprehension: verbalized understanding and needs further education   HOME EXERCISE  PROGRAM: Access Code: [email or web address] URL: [email or web address] Date: 02/14/2023 Prepared by: Ivery Quale  Exercises - Standing 'L' Stretch at Counter  - 2 x daily - 6 x weekly - 1 sets - 10 reps - 5 hold - Side Stepping with Counter Support  - 2 x daily - 6 x weekly - 1 sets - 5 reps - alternating marching  - 2 x daily - 6 x weekly - 1 sets - 15-20 reps - Standing Hip Extension with Counter Support  - 2 x daily - 6 x weekly - 1 sets - 15-20 reps - Standing Tandem Balance with Counter Support  - 2 x daily - 6 x weekly - 1 sets - 3 reps - 30 hold - Standing Scapular Retraction with External Rotation  - 2 x daily - 6 x weekly - 1 sets - 10 reps - 5 sec hold - Standing Lumbar Extension  - 2 x daily - 6 x weekly - 1 sets - 10 reps - 5 sec hold - Seated Assisted Cervical Rotation with Towel  - 2 x daily - 6 x weekly - 1 sets - 10 reps - 5 hold  ASSESSMENT:  CLINICAL IMPRESSION:  Overall she showed improved activity tolerance and balance with tandem balance today. She will continue to benefit from gradual progressions to improve function without flaring up with pain. She has now met short term PT goals and we will continue to work toward long term goals.  OBJECTIVE IMPAIRMENTS: decreased activity tolerance, difficulty walking, decreased balance, decreased endurance, decreased mobility, decreased ROM, decreased strength, impaired flexibility, impaired UE/LE use, postural dysfunction, and pain.  ACTIVITY LIMITATIONS: bending, lifting, carry, locomotion, cleaning, community activity,  driving  PERSONAL FACTORS: see above PMH are also affecting patient's functional outcome.  REHAB POTENTIAL: Good  CLINICAL DECISION MAKING: Stable/uncomplicated  EVALUATION COMPLEXITY: Low    GOALS: Short term PT Goals Target date: 03/14/2023   Pt will be I and compliant with HEP. Baseline:  Goal status: Met 03/20/23  Pt will decrease pain by 25% overall Baseline: Goal status:  Met 03/20/23  Long term PT goals Target date:04/11/2023   Pt will improve ROM to [facility] to improve functional mobility Baseline: Goal status: ongoing 03/20/23 Pt will improve  hip/knee strength to at least 5-/5 MMT to improve functional strength Baseline: Goal status: ongoing 03/20/23 Pt will improve FOTO to at least % functional to show improved function Baseline: Goal status: ongoing 03/20/23 Pt will reduce pain by overall 50% overall with usual activity Baseline: Goal status: ongoing 03/20/23 Pt will reduce pain to overall less than 2-3/10 with usual activity and work activity. Baseline: Goal status:ongoing 03/20/23 Pt will be able to ambulate community distances at least 1000 ft WNL gait pattern without complaints Baseline: Goal status:ongoing 03/20/23  PLAN: PT FREQUENCY: 1-2 times per week   PT DURATION: 6-8 weeks  PLANNED INTERVENTIONS (unless contraindicated): aquatic PT, Canalith repositioning, cryotherapy, Electrical stimulation, Iontophoresis with 4 mg/ml dexamethasome, Moist heat, traction, Ultrasound, gait training, Therapeutic exercise, balance training, neuromuscular re-education, patient/family education,  manual techniques, passive ROM, dry needling, taping, vasopnuematic device, vestibular, spinal manipulations, joint manipulations 97110-Therapeutic exercises, 97530- Therapeutic activity, 97112- Neuromuscular re-education, 97535- Self Care, 23762- Manual therapy, [id number]- Gait training, and [phone]- Aquatic Therapy  PLAN FOR NEXT SESSION:  Continue balance, core strengthening, and  stretching for LE and upper trap.     April Manson, PT,DPT 03/20/2023, 3:09 PM

## 2023-03-22 ENCOUNTER — Encounter: Payer: Self-pay | Admitting: Physical Therapy

## 2023-03-22 ENCOUNTER — Ambulatory Visit: Payer: PPO | Admitting: Physical Therapy

## 2023-03-22 DIAGNOSIS — M5459 Other low back pain: Secondary | ICD-10-CM

## 2023-03-22 DIAGNOSIS — M25552 Pain in left hip: Secondary | ICD-10-CM

## 2023-03-22 DIAGNOSIS — R2689 Other abnormalities of gait and mobility: Secondary | ICD-10-CM | POA: Diagnosis not present

## 2023-03-22 DIAGNOSIS — M25551 Pain in right hip: Secondary | ICD-10-CM

## 2023-03-22 DIAGNOSIS — M546 Pain in thoracic spine: Secondary | ICD-10-CM | POA: Diagnosis not present

## 2023-03-22 NOTE — Therapy (Signed)
OUTPATIENT PHYSICAL THERAPY TREATMENT  Patient Name: Bonnie Juarez MRN: [id number] DOB:1945/10/14, 78 y.o., female Today's Date: 03/22/2023  END OF SESSION:  PT End of Session - 03/22/23 1431     Visit Number 6    Number of Visits 15    Date for PT Re-Evaluation 04/11/23    Progress Note Due on Visit 10    PT Start Time 1432    PT Stop Time 1513    PT Time Calculation (min) 41 min    Activity Tolerance Patient tolerated treatment well    Behavior During Therapy WFL for tasks assessed/performed                Past Medical History:  Diagnosis Date   Anxiety    Arthritis    Cancer (HCC)    Hx: of squamouscell carcinoma on Left shin basal cell on face; melanoma on right lower leg   Complication of anesthesia    12/18/2003 deceased LOC and hypoxia overmedicating with Morphine and Phenergan; received Narcan X 2, overnight pulse ox   Depression    Fibromyalgia    Frequent urination    GERD (gastroesophageal reflux disease)    Hx: of   H/O hiatal hernia    PMH: of   Headache(784.0)    Hypertension    IBS (irritable bowel syndrome)    Hx: of   Melanoma (HCC)    Right leg   Osteopenia    Hx: of per pt, pt. adds osteoporosis- DOS   Pneumonia    hx   PONV (postoperative nausea and vomiting)    Past Surgical History:  Procedure Laterality Date   APPENDECTOMY     ARCUATE KERATECTOMY     BREAST LUMPECTOMY Right 1991   CERVICAL FUSION  1983   FRACTURE SURGERY Right 2014   humerus   HERNIA REPAIR     hiatal   KNEE ARTHROSCOPY Right 2015   NICEN FUNDO PLACATION  2010   NISSEN FUNDOPLICATION N/A 04/13/2020   Procedure: EXPLORATION OF FORGUT WITH ENDOSCOPY AND HITAL HERNIA CLOSURE, UPPER ENDOSCOPY;  Surgeon: Luretha Murphy, MD;  Location: [facility];  Service: General;  Laterality: N/A;   ORIF HUMERUS FRACTURE Right 01/08/2013   Procedure: OPEN REDUCTION INTERNAL FIXATION (ORIF) PROXIMAL HUMERUS FRACTURE;  Surgeon: Eldred Manges, MD;  Location: [facility] OR;  Service: Orthopedics;   Laterality: Right;  Open Reduction Internal Fixation Right Proximal Humerus Fracture   POSTERIOR CERVICAL FUSION/FORAMINOTOMY N/A 12/20/2016   Procedure: Posterior Cervical Fusion with lateral mass fixation - Cervical Three-Thoracic Two;  Surgeon: Tia Alert, MD;  Location: [facility] OR;  Service: Neurosurgery;  Laterality: N/A;   RADIAL HEAD REPLACEMENT  2005   trigger thrumb release Left 2001   tubular plasity  1970   XI ROBOTIC ASSISTED HIATAL HERNIA REPAIR N/A 12/06/2020   Procedure: XI ROBOTIC ASSISTED REDO HIATAL HERNIA REPAIR;  Surgeon: Luretha Murphy, MD;  Location: [facility];  Service: General;  Laterality: N/A;   Patient Active Problem List   Diagnosis Date Noted   Gastroparesis 12/06/2020   History of repair of hiatal hernia 04/13/2020   Plantar fasciitis of left foot 10/02/2018   S/P lumbar fusion 02/06/2018   Mixed dyslipidemia 11/13/2017   Family history of heart disease 11/13/2017   Statin intolerance 11/13/2017   Bilateral primary osteoarthritis of knee 10/30/2017   S/P cervical spinal fusion 12/20/2016   Spondylosis without myelopathy or radiculopathy, lumbar region 10/05/2016   Chronic bilateral low back pain without sciatica 10/05/2016   Fibromyalgia  10/05/2016   Closed fracture of right proximal humerus 01/08/2013    Class: Acute    PCP: Gweneth Dimitri, MD   REFERRING PROVIDER: Eldred Manges, MD   REFERRING DIAG: [phone] (ICD-10-CM) - Bilateral hip pain  Rationale for Evaluation and Treatment: Rehabilitation  THERAPY DIAG:  Pain in left hip  Pain in right hip  Pain in thoracic spine  Other abnormalities of gait and mobility  Other low back pain  ONSET DATE: Chronic pain  SUBJECTIVE:                                                                                                                                                                                           SUBJECTIVE STATEMENT: Had a really great day yesterday.    PERTINENT  HISTORY:   Low back pain, fusion L4-5 in 2019, disc protrusions L2-3 and L3-4 PMH: lumbar and cervical fusion, fibromyalgia,knee OA,  PAIN:  NPRS scale: no pain just tightness in neck Pain location:back and down her legs Pain description: constant but the intense pain comes and goes Aggravating factors: turning her head, standing,  Relieving factors: rest, meds  PRECAUTIONS: None  RED FLAGS: None   WEIGHT BEARING RESTRICTIONS: No  FALLS:  Has patient fallen in last 6 months? Yes. Number of falls 1, tripped over bedding   PLOF: Independent with basic ADLs  PATIENT GOALS: reduce pain, improve activity  NEXT MD VISIT: nothing scheduled at eval  OBJECTIVE:  Note: Objective measures were completed at Evaluation unless otherwise noted.  DIAGNOSTIC FINDINGS:  XR in chart  PATIENT SURVEYS:  Eval: FOTO 61% functional, goal is 66%  COGNITION: 02/14/2023 Overall cognitive status: Within functional limits for tasks assessed     SENSATION: 02/14/2023 [facility]  MUSCLE LENGTH: 02/14/2023 Hamstrings: tight bilat Thomas test: tight bilat  LUMBAR ROM:   AROM 02/14/2023 Held updates today due to time to discuss grief  Flexion 50%   Extension 50%   Right lateral flexion    Left lateral flexion    Right rotation 25%   Left rotation 25%    (Blank rows = not tested)    LOWER EXTREMITY MMT:    MMT Right 02/14/2023 Left 02/14/2023  Hip flexion [facility]  Hip extension    Hip abduction [facility]  Hip adduction    Hip internal rotation    Hip external rotation    Knee flexion [facility] WFL  Knee extension WFL WFL   (Blank rows = not tested)  FUNCTIONAL TESTS:  02/14/2023 Timed up and go (TUG): 17:01 sec  TODAY'S TREATMENT: 03/22/23 TherEx  NuStep L6 x 8 min Leg Press 50# 2x10; single limb 25# 2x10 bil Standing hip ext x20 bil Standing hip abduction x20 bil Tandem balance 30 sec X 3 bilat Cervical rotation stretch AAROM with towel 5 sec X 5 bilat Upper  trap stretch 3x10 sec bil   DATE: 03/20/2023 Precor bike L1 X 8 min Slantboard stretch 30 sec X 3 Leg press DL 16# [id number], then 04# SL X 10 each side Alternating unilat UE rows red X 15 each Standing lumbar L stretch 5 sec X 10 Sidestepping 3 round trips at counter without UE support Standing hip extensions X 10 bilat Standing marches X 10 bilat Standing hip abduction X 10 bilat Tandem balance 30 sec X 3 bilat Cervical rotation stretch AAROM with towel 5 sec X 5 bilat Upper trap stretch 3 X 20 sec bilat  DATE:  03/15/2023 Therex: Bike level 1; required brief stop due to quad cramp Gastroc stretc 2x30sec Standing hamstring stretch with front leg on 6 inch step in // bars 30 sec x 2 bilateral  Standing marching without hands x 20 each LE Standing glute kicks 2x10ea varied UE assist (heavy HHA with fatigue) Standing hip abduction with bilateral hands on bars x 20 each side Standing hip extension with bilateral hands on bars x 20 each side Seated upper trap stretch 3x20sec ea side    03/13/2023 Therex: Nustep lvl 6 10 mins UE/LE  Standing hamstring stretch with front leg on 6 inch step in // bars 30 sec x 2 bilateral  Standing marching with bilateral hands on bars x 20 each LE Standing hip abduction with bilateral hands on bars x 20 each side Standing hip extension with bilateral hands on bars x 20 each side Lateral stepping in // bars with bilateral light hand assist on anterior bar 10 ft x 3 each way  Supine hooklying ab bracing c verbal cues/tactile for activation 5 sec hold x10 Posterior pelvic tilt c verbal cues/tactile 5 sec hold x10      PATIENT EDUCATION: Education details: HEP, PT plan of care Person educated: Patient Education method: Explanation, Demonstration, Verbal cues, and Handouts Education comprehension: verbalized understanding and needs further education   HOME EXERCISE PROGRAM: Access Code: [email or web address] URL:  [email or web address] Date: 02/14/2023 Prepared by: Ivery Quale  Exercises - Standing 'L' Stretch at Counter  - 2 x daily - 6 x weekly - 1 sets - 10 reps - 5 hold - Side Stepping with Counter Support  - 2 x daily - 6 x weekly - 1 sets - 5 reps - alternating marching  - 2 x daily - 6 x weekly - 1 sets - 15-20 reps - Standing Hip Extension with Counter Support  - 2 x daily - 6 x weekly - 1 sets - 15-20 reps - Standing Tandem Balance with Counter Support  - 2 x daily - 6 x weekly - 1 sets - 3 reps - 30 hold - Standing Scapular Retraction with External Rotation  - 2 x daily - 6 x weekly - 1 sets - 10 reps - 5 sec hold - Standing Lumbar Extension  - 2 x daily - 6 x weekly - 1 sets - 10 reps - 5 sec hold - Seated Assisted Cervical Rotation with Towel  - 2 x daily - 6 x weekly - 1 sets - 10 reps - 5 hold  ASSESSMENT:  CLINICAL IMPRESSION:  Pt reporting improved symptoms and able to increase activity yesterday including  vacuuming without feeling too sore today.  Overall progressing well; may be ready for d/c next week but will make sure pain continues to be improved.    OBJECTIVE IMPAIRMENTS: decreased activity tolerance, difficulty walking, decreased balance, decreased endurance, decreased mobility, decreased ROM, decreased strength, impaired flexibility, impaired UE/LE use, postural dysfunction, and pain.  ACTIVITY LIMITATIONS: bending, lifting, carry, locomotion, cleaning, community activity, driving  PERSONAL FACTORS: see above PMH are also affecting patient's functional outcome.  REHAB POTENTIAL: Good  CLINICAL DECISION MAKING: Stable/uncomplicated  EVALUATION COMPLEXITY: Low    GOALS: Short term PT Goals Target date: 03/14/2023   Pt will be I and compliant with HEP. Baseline:  Goal status: Met 03/20/23  Pt will decrease pain by 25% overall Baseline: Goal status:  Met 03/20/23  Long term PT goals Target date:04/11/2023   Pt will improve ROM to [facility] to improve  functional mobility Baseline: Goal status: ongoing 03/20/23 Pt will improve  hip/knee strength to at least 5-/5 MMT to improve functional strength Baseline: Goal status: ongoing 03/20/23 Pt will improve FOTO to at least % functional to show improved function Baseline: Goal status: ongoing 03/20/23 Pt will reduce pain by overall 50% overall with usual activity Baseline: Goal status: ongoing 03/20/23 Pt will reduce pain to overall less than 2-3/10 with usual activity and work activity. Baseline: Goal status:ongoing 03/20/23 Pt will be able to ambulate community distances at least 1000 ft WNL gait pattern without complaints Baseline: Goal status:ongoing 03/20/23  PLAN: PT FREQUENCY: 1-2 times per week   PT DURATION: 6-8 weeks  PLANNED INTERVENTIONS (unless contraindicated): aquatic PT, Canalith repositioning, cryotherapy, Electrical stimulation, Iontophoresis with 4 mg/ml dexamethasome, Moist heat, traction, Ultrasound, gait training, Therapeutic exercise, balance training, neuromuscular re-education, patient/family education,  manual techniques, passive ROM, dry needling, taping, vasopnuematic device, vestibular, spinal manipulations, joint manipulations 97110-Therapeutic exercises, 97530- Therapeutic activity, [id number]- Neuromuscular re-education, 97535- Self Care, [id number]- Manual therapy, [id number]- Gait training, and [phone]- Aquatic Therapy  PLAN FOR NEXT SESSION:  How is she doing?  Will need to schedule additional visits if continuing PT  Continue balance, core strengthening, and stretching for LE and upper trap.     Clarita Crane, PT, DPT 03/22/23 3:24 PM

## 2023-03-25 ENCOUNTER — Other Ambulatory Visit: Payer: Self-pay | Admitting: Internal Medicine

## 2023-03-25 DIAGNOSIS — E782 Mixed hyperlipidemia: Secondary | ICD-10-CM

## 2023-03-25 DIAGNOSIS — Z789 Other specified health status: Secondary | ICD-10-CM

## 2023-03-26 ENCOUNTER — Encounter: Payer: Self-pay | Admitting: Nurse Practitioner

## 2023-03-26 ENCOUNTER — Ambulatory Visit: Payer: PPO | Attending: Nurse Practitioner | Admitting: Nurse Practitioner

## 2023-03-26 VITALS — BP 136/78 | HR 69 | Ht 66.0 in | Wt 151.4 lb

## 2023-03-26 DIAGNOSIS — R931 Abnormal findings on diagnostic imaging of heart and coronary circulation: Secondary | ICD-10-CM | POA: Diagnosis not present

## 2023-03-26 DIAGNOSIS — R55 Syncope and collapse: Secondary | ICD-10-CM

## 2023-03-26 DIAGNOSIS — I10 Essential (primary) hypertension: Secondary | ICD-10-CM

## 2023-03-26 DIAGNOSIS — T466X5D Adverse effect of antihyperlipidemic and antiarteriosclerotic drugs, subsequent encounter: Secondary | ICD-10-CM

## 2023-03-26 DIAGNOSIS — M791 Myalgia, unspecified site: Secondary | ICD-10-CM

## 2023-03-26 DIAGNOSIS — E785 Hyperlipidemia, unspecified: Secondary | ICD-10-CM

## 2023-03-26 DIAGNOSIS — R42 Dizziness and giddiness: Secondary | ICD-10-CM

## 2023-03-26 DIAGNOSIS — I951 Orthostatic hypotension: Secondary | ICD-10-CM

## 2023-03-26 MED ORDER — REPATHA SURECLICK 140 MG/ML ~~LOC~~ SOAJ: 1.0000 | SUBCUTANEOUS | 3 refills | Status: AC

## 2023-03-26 NOTE — Progress Notes (Signed)
Office Visit    Patient Name: Bonnie Juarez Date of Encounter: 03/26/2023  Primary Care Provider:  Gweneth Dimitri, MD Primary Cardiologist:  Chrystie Nose, MD  Chief Complaint    78 year old female with the above past medical history including syncope, mild coronary artery calcification, hypertension,  orthostatic hypotension, mixed dyslipidemia with statin intolerance, anxiety, depression, and fibromyalgia who presents for follow-up related to hypertension.   Past Medical History    Past Medical History:  Diagnosis Date   Anxiety    Arthritis    Cancer (HCC)    Hx: of squamouscell carcinoma on Left shin basal cell on face; melanoma on right lower leg   Complication of anesthesia    Nov 22, 2003 deceased LOC and hypoxia overmedicating with Morphine and Phenergan; received Narcan X 2, overnight pulse ox   Depression    Fibromyalgia    Frequent urination    GERD (gastroesophageal reflux disease)    Hx: of   H/O hiatal hernia    PMH: of   Headache(784.0)    Hypertension    IBS (irritable bowel syndrome)    Hx: of   Melanoma (HCC)    Right leg   Osteopenia    Hx: of per pt, pt. adds osteoporosis- DOS   Pneumonia    hx   PONV (postoperative nausea and vomiting)    Past Surgical History:  Procedure Laterality Date   APPENDECTOMY     ARCUATE KERATECTOMY     BREAST LUMPECTOMY Right 1991   CERVICAL FUSION  1983   FRACTURE SURGERY Right 2014   humerus   HERNIA REPAIR     hiatal   KNEE ARTHROSCOPY Right 2015   NICEN FUNDO PLACATION  2010   NISSEN FUNDOPLICATION N/A 04/13/2020   Procedure: EXPLORATION OF FORGUT WITH ENDOSCOPY AND HITAL HERNIA CLOSURE, UPPER ENDOSCOPY;  Surgeon: Luretha Murphy, MD;  Location: [facility];  Service: General;  Laterality: N/A;   ORIF HUMERUS FRACTURE Right 01/08/2013   Procedure: OPEN REDUCTION INTERNAL FIXATION (ORIF) PROXIMAL HUMERUS FRACTURE;  Surgeon: Eldred Manges, MD;  Location: [facility] OR;  Service: Orthopedics;  Laterality: Right;  Open Reduction  Internal Fixation Right Proximal Humerus Fracture   POSTERIOR CERVICAL FUSION/FORAMINOTOMY N/A 12/20/2016   Procedure: Posterior Cervical Fusion with lateral mass fixation - Cervical Three-Thoracic Two;  Surgeon: Tia Alert, MD;  Location: [facility];  Service: Neurosurgery;  Laterality: N/A;   RADIAL HEAD REPLACEMENT  2005   trigger thrumb release Left 2001   tubular plasity  1970   XI ROBOTIC ASSISTED HIATAL HERNIA REPAIR N/A 12/06/2020   Procedure: XI ROBOTIC ASSISTED REDO HIATAL HERNIA REPAIR;  Surgeon: Luretha Murphy, MD;  Location: [facility];  Service: General;  Laterality: N/A;    Allergies  Allergies  Allergen Reactions   Boniva [Ibandronic Acid] Other (See Comments)    Bone Pain   Dilaudid [Hydromorphone Hcl] Other (See Comments)    Hallucinations   Risedronate Sodium Other (See Comments)    Bone pain   Statins Other (See Comments)     Labs/Other Studies Reviewed    The following studies were reviewed today:  Cardiac Studies & Procedures     STRESS TESTS  MYOCARDIAL PERFUSION IMAGING 10/15/2012  ECHOCARDIOGRAM  ECHOCARDIOGRAM COMPLETE 04/18/2022  Narrative ECHOCARDIOGRAM REPORT    Patient Name:   Bonnie Juarez  Date of Exam: 04/18/2022 Medical Rec #:  [id number]     Height:       66.0 in Accession #:    [id number]  Weight:       147.0 lb Date of Birth:  1945-08-12     BSA:          1.755 m Patient Age:    33 years      BP:           124/56 mmHg Patient Gender: F             HR:           71 bpm. Exam Location:  [address]  Procedure: 2D Echo, Cardiac Doppler and Color Doppler  Indications:    R55 Syncope  History:        Patient has prior history of Echocardiogram examinations, most recent 10/30/2012. Risk Factors:Hypertension.  Sonographer:    Daphine Deutscher RDCS Referring Phys: [id number] [facility] [place]  IMPRESSIONS   1. Left ventricular ejection fraction, by estimation, is 60 to 65%. The left ventricle has normal function. The left  ventricle has no regional wall motion abnormalities. There is moderate asymmetric left ventricular hypertrophy of the basal-septal segment. Left ventricular diastolic parameters are consistent with Grade I diastolic dysfunction (impaired relaxation). 2. Right ventricular systolic function is normal. The right ventricular size is normal. 3. Left atrial size was mildly dilated. 4. Right atrial size was mildly dilated. 5. The mitral valve is normal in structure. Trivial mitral valve regurgitation. No evidence of mitral stenosis. 6. The aortic valve is tricuspid. There is mild calcification of the aortic valve. Aortic valve regurgitation is not visualized. Aortic valve sclerosis/calcification is present, without any evidence of aortic stenosis. 7. The inferior vena cava is normal in size with greater than 50% respiratory variability, suggesting right atrial pressure of 3 mmHg.  FINDINGS Left Ventricle: Left ventricular ejection fraction, by estimation, is 60 to 65%. The left ventricle has normal function. The left ventricle has no regional wall motion abnormalities. The left ventricular internal cavity size was normal in size. There is moderate asymmetric left ventricular hypertrophy of the basal-septal segment. Left ventricular diastolic parameters are consistent with Grade I diastolic dysfunction (impaired relaxation).  Right Ventricle: The right ventricular size is normal. No increase in right ventricular wall thickness. Right ventricular systolic function is normal.  Left Atrium: Left atrial size was mildly dilated.  Right Atrium: Right atrial size was mildly dilated.  Pericardium: There is no evidence of pericardial effusion.  Mitral Valve: The mitral valve is normal in structure. Trivial mitral valve regurgitation. No evidence of mitral valve stenosis.  Tricuspid Valve: The tricuspid valve is normal in structure. Tricuspid valve regurgitation is mild . No evidence of tricuspid  stenosis.  Aortic Valve: The aortic valve is tricuspid. There is mild calcification of the aortic valve. Aortic valve regurgitation is not visualized. Aortic valve sclerosis/calcification is present, without any evidence of aortic stenosis.  Pulmonic Valve: The pulmonic valve was normal in structure. Pulmonic valve regurgitation is mild. No evidence of pulmonic stenosis.  Aorta: The aortic root is normal in size and structure.  Venous: The inferior vena cava is normal in size with greater than 50% respiratory variability, suggesting right atrial pressure of 3 mmHg.  IAS/Shunts: No atrial level shunt detected by color flow Doppler.   LEFT VENTRICLE PLAX 2D LVIDd:         4.80 cm   Diastology LVIDs:         3.10 cm   LV e' medial:    6.25 cm/s LV PW:         0.80 cm   LV E/e' medial:  10.9 LV IVS:        0.70 cm   LV e' lateral:   9.30 cm/s LVOT diam:     2.10 cm   LV E/e' lateral: 7.4 LV SV:         98 LV SV Index:   56 LVOT Area:     3.46 cm   RIGHT VENTRICLE             IVC RV Basal diam:  3.70 cm     IVC diam: 1.60 cm RV S prime:     10.75 cm/s TAPSE (M-mode): 1.8 cm  LEFT ATRIUM             Index        RIGHT ATRIUM           Index LA diam:        4.00 cm 2.28 cm/m   RA Area:     12.90 cm LA Vol (A2C):   31.1 ml 17.73 ml/m  RA Volume:   31.50 ml  17.95 ml/m LA Vol (A4C):   29.0 ml 16.53 ml/m LA Biplane Vol: 30.5 ml 17.38 ml/m AORTIC VALVE LVOT Vmax:   127.00 cm/s LVOT Vmean:  78.800 cm/s LVOT VTI:    0.282 m  AORTA Ao Root diam: 3.00 cm Ao Asc diam:  3.10 cm  MITRAL VALVE               TRICUSPID VALVE MV Area (PHT): 3.34 cm    TR Peak grad:   18.3 mmHg MV Decel Time: 227 msec    TR Vmax:        214.00 cm/s MV E velocity: 68.40 cm/s MV A velocity: 86.20 cm/s  SHUNTS MV E/A ratio:  0.79        Systemic VTI:  0.28 m Systemic Diam: 2.10 cm  Arvilla Meres MD Electronically signed by Arvilla Meres MD Signature Date/Time: 04/18/2022/2:15:17  PM    Final   MONITORS  LONG TERM MONITOR-LIVE TELEMETRY (3-14 DAYS) 04/05/2022  Narrative Patch Wear Time:  13 days and 16 hours (2024-01-09T17:37:39-0500 to 2024-01-23T09:45:27-0500)  Patient had a min HR of 46 bpm, max HR of 167 bpm, and avg HR of 64 bpm. Predominant underlying rhythm was Sinus Rhythm. 32 Supraventricular Tachycardia runs occurred, the run with the fastest interval lasting 4 beats with a max rate of 167 bpm, the longest lasting 18 beats with an avg rate of 92 bpm. Isolated SVEs were rare (<1.0%), SVE Couplets were rare (<1.0%), and SVE Triplets were rare (<1.0%). Isolated VEs were rare (<1.0%, 335), VE Couplets were rare (<1.0%, 1), and VE Triplets were rare (<1.0%, 1).  Monitor shows sinus rhythm with 32 episodes of PSVT, but only up to 18 beats. No afib or pauses.  Chrystie Nose, MD, [facility], FACP [place]  [facility] Medical Director of the [facility]ovascular [facility] Diplomate of the American Board of Clinical Lipidology Attending Cardiologist Direct Dial: [phone]  Fax: [phone] Website:  [email or web address]  CT SCANS  CT CARDIAC SCORING (SELF PAY ONLY) 11/21/2017  Addendum 11/22/2017 12:37 PM ADDENDUM REPORT: 11/22/2017 12:35  CLINICAL DATA:  Risk stratification  EXAM: Coronary Calcium Score  TECHNIQUE: The patient was scanned on a [employer] scanner. Axial non-contrast 3 mm slices were carried out through the heart. The data set was analyzed on a dedicated work station and scored using the Agatson method.  FINDINGS: Non-cardiac: See separate report from [facility].  Ascending Aorta: Normal  size, moderate diffuse calcifications in the aortic root and descending aorta.  Pericardium: Normal.  Coronary arteries: Normal origin.  IMPRESSION: 1. Coronary calcium score of 80. This was 36 percentile for age and sex matched control.  2. Aorta has normal size with moderate diffuse  calcifications in the aortic root and descending aorta.   Electronically Signed By: Tobias Alexander On: 11/22/2017 12:35  Addendum 11/21/2017  5:15 PM ADDENDUM REPORT: 11/21/2017 17:13  CLINICAL DATA:  Risk stratification  EXAM: Coronary Calcium Score  TECHNIQUE: The patient was scanned on a Siemens Somatom 64 slice scanner. Axial non-contrast 3 mm slices were carried out through the heart. The data set was analyzed on a dedicated work station and scored using the Agatson method.  FINDINGS: Non-cardiac: See separate report from [facility].  Ascending Aorta: Normal diameter 3.4 cm  Pericardium: Normal  Coronary arteries: Calcium noted in proximal circumflex and LAD and mid LAD  IMPRESSION: Coronary calcium score of 70. This was 24 th percentile for age and sex matched control.  Charlton Haws   Electronically Signed By: Charlton Haws M.D. On: 11/21/2017 17:13  Narrative EXAM: OVER-READ INTERPRETATION  CT CHEST  The following report is an over-read performed by radiologist Dr. Trudie Reed of [facility] on 11/21/2017. This over-read does not include interpretation of cardiac or coronary anatomy or pathology. The coronary calcium score interpretation by the cardiologist is attached.  COMPARISON:  None.  FINDINGS: Aortic atherosclerosis. Within the visualized portions of the thorax there are no suspicious appearing pulmonary nodules or masses, there is no acute consolidative airspace disease, no pleural effusions, no pneumothorax and no lymphadenopathy. Moderate-sized hiatal hernia. Visualized portions of the upper abdomen are unremarkable. There are no aggressive appearing lytic or blastic lesions noted in the visualized portions of the skeleton.  IMPRESSION: 1. Moderate-sized hiatal hernia. 2.  Aortic Atherosclerosis (ICD10-I70.0).  Electronically Signed: By: Trudie Reed M.D. On: 11/21/2017 16:50         Recent  Labs: 03/07/2023: ALT 11; BUN 21; Creatinine, Ser 0.85; Hemoglobin 12.3; Platelets 284; Potassium 5.0; Sodium 144; TSH 2.330  Recent Lipid Panel    Component Value Date/Time   CHOL 180 03/07/2023 1715   TRIG 83 03/07/2023 1715   HDL 92 03/07/2023 1715   CHOLHDL 2.0 03/07/2023 1715   LDLCALC 73 03/07/2023 1715    History of Present Illness    78 year old female with the above past medical history including syncope, mild coronary artery calcification, hypertension,  orthostatic hypotension, mixed dyslipidemia with statin intolerance, anxiety, depression, and fibromyalgia.   Echocardiogram in 2014 showed normal LV function, no significant valvular abnormalities. She has a strong history of CAD.  Coronary calcium score in 2019 was 80 (59th percentile). She has followed with Dr. Rennis Golden for management of dyslipidemia with statin intolerance, on Repatha.  She had a syncopal episode in 01/2023.  Carotid ultrasound revealed 1 to 39% R ICA stenosis.  Cardiac monitor showed episodes of PSVT, longest lasting 18 beats.  Echocardiogram showed mild diastolic dysfunction, normal LV function.  She was noted to be severely orthostatic.  All BP medications were discontinued.  She was last seen in the office on 08/08/2022 and was stable from a cardiac standpoint.  BP was well-controlled.  She denied any further dizziness, presyncope or syncope.   She presents today for follow-up.  Since her last visit she has done well from a cardiac standpoint. She is coming up on the 1 year anniversary of her husband's death. She some recent  dizziness that resulted loss of balance and a fall.  She denies presyncope or syncope.  She was treated for fluid in her ear and her symptoms resolved.  She denies any further dizziness.  She denies symptoms concerning for angina.  BP has been well-controlled.  She is needing a refill of her Repatha.  Overall she reports feeling well.  Home Medications    Current Outpatient Medications   Medication Sig Dispense Refill   acetaminophen (TYLENOL) 500 MG tablet Take 500-1,000 mg by mouth every 6 (six) hours as needed for moderate pain.     Ascorbic Acid (VITAMIN C) 1000 MG tablet Take 1,000 mg by mouth daily.     cholecalciferol (VITAMIN D3) 25 MCG (1000 UNIT) tablet Take 1,000 Units by mouth every other day.     Coenzyme Q10 (COQ10) 200 MG CAPS Take 200 mg by mouth daily.     cromolyn (NASALCROM) 5.2 MG/ACT nasal spray Place 1 spray into both nostrils 2 (two) times daily as needed for allergies.     doxepin (SINEQUAN) 50 MG capsule Take 50 mg by mouth at bedtime.     escitalopram (LEXAPRO) 20 MG tablet Take 40 mg by mouth at bedtime.  2   loratadine (CLARITIN) 10 MG tablet Take 10 mg by mouth daily.     Polyethyl Glycol-Propyl Glycol (SYSTANE) 0.4-0.3 % SOLN Place 1-2 drops into both eyes 3 (three) times daily as needed (dry/irritated eyes).     RABEprazole (ACIPHEX) 20 MG tablet Take 20 mg by mouth daily.     vitamin B-12 (CYANOCOBALAMIN) 1000 MCG tablet Take 1,000 mcg by mouth every other day.     denosumab (PROLIA) 60 MG/ML SOSY injection Inject 60 mg into the skin every 6 (six) months. (Patient not taking: Reported on 08/08/2022)     EVENITY 105 MG/1. SOSY injection Inject 210 mg into the skin every 30 (thirty) days.     Evolocumab (REPATHA SURECLICK) 140 MG/ML SOAJ Inject 140 mg into the skin every 14 (fourteen) days. 6 mL 3   traZODone (DESYREL) 100 MG tablet Take 100 mg by mouth at bedtime.   2   No current facility-administered medications for this visit.     Review of Systems    She denies chest pain, palpitations, dyspnea, pnd, orthopnea, n, v, dizziness, syncope, edema, weight gain, or early satiety. All other systems reviewed and are otherwise negative except as noted above.   Physical Exam    VS:  BP 136/78 (BP Location: Left Arm, Patient Position: Sitting, Cuff Size: Normal)   Pulse 69   Ht 5\' 6"  (1.676 m)   Wt 151 lb 6.4 oz (68.7 kg)   SpO2 96%   BMI  24.44 kg/m   GEN: Well nourished, well developed, in no acute distress. HEENT: normal. Neck: Supple, no JVD, carotid bruits, or masses. Cardiac: RRR, no murmurs, rubs, or gallops. No clubbing, cyanosis, edema.  Radials/DP/PT 2+ and equal bilaterally.  Respiratory:  Respirations regular and unlabored, clear to auscultation bilaterally. GI: Soft, nontender, nondistended, BS + x 4. MS: no deformity or atrophy. Skin: warm and dry, no rash. Neuro:  Strength and sensation are intact. Psych: Normal affect.  Accessory Clinical Findings    ECG personally reviewed by me today - EKG Interpretation Date/Time:  Monday March 26 2023 14:43:01 EST Ventricular Rate:  69 PR Interval:  164 QRS Duration:  96 QT Interval:  390 QTC Calculation: 417 R Axis:   -49  Text Interpretation: Normal sinus rhythm Low voltage QRS Left  anterior fascicular block Inferior infarct (cited on or before 12-Dec-2016) Possible Anterolateral infarct (cited on or before 12-Dec-2016) When compared with ECG of 06-Apr-2020 11:32, No significant change was found Confirmed by Bernadene Person ([id number]) on 03/26/2023 2:45:42 PM  - no acute changes.   Lab Results  Component Value Date   WBC 8.3 03/07/2023   HGB 12.3 03/07/2023   HCT 38.5 03/07/2023   MCV 89 03/07/2023   PLT 284 03/07/2023   Lab Results  Component Value Date   CREATININE 0.85 03/07/2023   BUN 21 03/07/2023   NA 144 03/07/2023   K 5.0 03/07/2023   CL 105 03/07/2023   CO2 26 03/07/2023   Lab Results  Component Value Date   ALT 11 03/07/2023   AST 18 03/07/2023   ALKPHOS 100 03/07/2023   BILITOT 0.3 03/07/2023   Lab Results  Component Value Date   CHOL 180 03/07/2023   HDL 92 03/07/2023   LDLCALC 73 03/07/2023   TRIG 83 03/07/2023   CHOLHDL 2.0 03/07/2023    No results found for: "HGBA1C"  Assessment & Plan    1. Presyncope/syncope/Orthostatic hypotension:  Carotid ultrasound following syncopal episode revealed 1 to 39% R ICA stenosis. Cardiac  monitor showed episodes of PSVT, longest lasting 18 beats. Echo showed mild diastolic dysfunction, normal LV function. Syncope thought to have occurred in the setting of severe orthostatic hypotension. Recent episode of dizziness that resulted loss of balance and a fall.  She denies presyncope or syncope.  She was treated for fluid in her ear and her symptoms resolved.  She denies any recurrent dizziness, presyncope, syncope. BP has been stable off all BP meds.   2. Coronary artery calcification: Coronary calcium score in 2019 was 80 (59th percentile).  Stable with no anginal symptoms. No indication for ischemic evaluation.  Continue Repatha.   3. Hypertension: BP stable off meds. Advised her to continue to monitor BP and report BP consistently greater than 150/80 (some permissive hypertension in the setting of symptomatic orthostatic hypotension).    4. Mixed dyslipidemia: LDL was 73 in 02/2023.  Continue Repatha.   5. Disposition: Follow-up in 6 months with Dr. Rennis Golden.        Joylene Grapes, NP 03/26/2023, 3:49 PM

## 2023-03-26 NOTE — Patient Instructions (Signed)
Medication Instructions:  Your physician recommends that you continue on your current medications as directed. Please refer to the Current Medication list given to you today.   *If you need a refill on your cardiac medications before your next appointment, please call your pharmacy*   Lab Work: NONE ordered at this time of appointment    Testing/Procedures: NONE ordered at this time of appointment     Follow-Up: At [facility], you and your health needs are our priority.  As part of our continuing mission to provide you with exceptional heart care, we have created designated Provider Care Teams.  These Care Teams include your primary Cardiologist (physician) and Advanced Practice Providers (APPs -  Physician Assistants and Nurse Practitioners) who all work together to provide you with the care you need, when you need it.  We recommend signing up for the patient portal called "MyChart".  Sign up information is provided on this After Visit Summary.  MyChart is used to connect with patients for Virtual Visits (Telemedicine).  Patients are able to view lab/test results, encounter notes, upcoming appointments, etc.  Non-urgent messages can be sent to your provider as well.   To learn more about what you can do with MyChart, go to [email or web address].    Your next appointment:   6 month(s)  Provider:   Chrystie Nose, MD

## 2023-03-27 ENCOUNTER — Ambulatory Visit: Payer: PPO | Admitting: Rehabilitative and Restorative Service Providers"

## 2023-03-27 ENCOUNTER — Encounter: Payer: Self-pay | Admitting: Rehabilitative and Restorative Service Providers"

## 2023-03-27 DIAGNOSIS — R2689 Other abnormalities of gait and mobility: Secondary | ICD-10-CM | POA: Diagnosis not present

## 2023-03-27 DIAGNOSIS — M546 Pain in thoracic spine: Secondary | ICD-10-CM

## 2023-03-27 DIAGNOSIS — M5459 Other low back pain: Secondary | ICD-10-CM

## 2023-03-27 DIAGNOSIS — M25551 Pain in right hip: Secondary | ICD-10-CM | POA: Diagnosis not present

## 2023-03-27 DIAGNOSIS — M25552 Pain in left hip: Secondary | ICD-10-CM

## 2023-03-27 NOTE — Therapy (Signed)
OUTPATIENT PHYSICAL THERAPY TREATMENT  Patient Name: Bonnie Juarez MRN: [id number] DOB:[birth year], 78 y.o., female Today's Date: 03/27/2023  END OF SESSION:  PT End of Session - 03/27/23 1425     Visit Number 7    Number of Visits 15    Date for PT Re-Evaluation 04/11/23    Authorization Type [employer] $10 copay    Progress Note Due on Visit 10    PT Start Time 1420    PT Stop Time 1500    PT Time Calculation (min) 40 min    Activity Tolerance Patient tolerated treatment well    Behavior During Therapy WFL for tasks assessed/performed                 Past Medical History:  Diagnosis Date   Anxiety    Arthritis    Cancer (HCC)    Hx: of squamouscell carcinoma on Left shin basal cell on face; melanoma on right lower leg   Complication of anesthesia    Dec 01, 2003 deceased LOC and hypoxia overmedicating with Morphine and Phenergan; received Narcan X 2, overnight pulse ox   Depression    Fibromyalgia    Frequent urination    GERD (gastroesophageal reflux disease)    Hx: of   H/O hiatal hernia    PMH: of   Headache(784.0)    Hypertension    IBS (irritable bowel syndrome)    Hx: of   Melanoma (HCC)    Right leg   Osteopenia    Hx: of per pt, pt. adds osteoporosis- DOS   Pneumonia    hx   PONV (postoperative nausea and vomiting)    Past Surgical History:  Procedure Laterality Date   APPENDECTOMY     ARCUATE KERATECTOMY     BREAST LUMPECTOMY Right 1991   CERVICAL FUSION  1983   FRACTURE SURGERY Right 2014   humerus   HERNIA REPAIR     hiatal   KNEE ARTHROSCOPY Right 2015   NICEN FUNDO PLACATION  2010   NISSEN FUNDOPLICATION N/A 04/13/2020   Procedure: EXPLORATION OF FORGUT WITH ENDOSCOPY AND HITAL HERNIA CLOSURE, UPPER ENDOSCOPY;  Surgeon: Luretha Murphy, MD;  Location: [facility];  Service: General;  Laterality: N/A;   ORIF HUMERUS FRACTURE Right 01/08/2013   Procedure: OPEN REDUCTION INTERNAL FIXATION (ORIF) PROXIMAL HUMERUS FRACTURE;  Surgeon: Eldred Manges,  MD;  Location: [facility];  Service: Orthopedics;  Laterality: Right;  Open Reduction Internal Fixation Right Proximal Humerus Fracture   POSTERIOR CERVICAL FUSION/FORAMINOTOMY N/A 12/20/2016   Procedure: Posterior Cervical Fusion with lateral mass fixation - Cervical Three-Thoracic Two;  Surgeon: Tia Alert, MD;  Location: [facility] OR;  Service: Neurosurgery;  Laterality: N/A;   RADIAL HEAD REPLACEMENT  2005   trigger thrumb release Left 2001   tubular plasity  1970   XI ROBOTIC ASSISTED HIATAL HERNIA REPAIR N/A 12/06/2020   Procedure: XI ROBOTIC ASSISTED REDO HIATAL HERNIA REPAIR;  Surgeon: Luretha Murphy, MD;  Location: [facility];  Service: General;  Laterality: N/A;   Patient Active Problem List   Diagnosis Date Noted   Gastroparesis 12/06/2020   History of repair of hiatal hernia 04/13/2020   Plantar fasciitis of left foot 10/02/2018   S/P lumbar fusion 02/06/2018   Mixed dyslipidemia 11/13/2017   Family history of heart disease 11/13/2017   Statin intolerance 11/13/2017   Bilateral primary osteoarthritis of knee 10/30/2017   S/P cervical spinal fusion 12/20/2016   Spondylosis without myelopathy or radiculopathy, lumbar region 10/05/2016   Chronic bilateral  low back pain without sciatica 10/05/2016   Fibromyalgia 10/05/2016   Closed fracture of right proximal humerus 01/08/2013    Class: Acute    PCP: Gweneth Dimitri, MD   REFERRING PROVIDER: Eldred Manges, MD   REFERRING DIAG: [phone] (ICD-10-CM) - Bilateral hip pain  Rationale for Evaluation and Treatment: Rehabilitation  THERAPY DIAG:  Pain in left hip  Pain in right hip  Pain in thoracic spine  Other abnormalities of gait and mobility  Other low back pain  ONSET DATE: Chronic pain  SUBJECTIVE:                                                                                                                                                                                           SUBJECTIVE STATEMENT: Pt  indicated having some neck pain yesterday but doing better today.  Pt indicated feeling progressively better.    Reported tylenol helped neck.    PERTINENT HISTORY:   Low back pain, fusion L4-5 in 2019, disc protrusions L2-3 and L3-4 PMH: lumbar and cervical fusion, fibromyalgia,knee OA,  PAIN:  NPRS scale: upon arrival 0/10 Pain location:back and down her legs Pain description: constant but the intense pain comes and goes Aggravating factors: turning her head, standing,  Relieving factors: rest, meds  PRECAUTIONS: None  RED FLAGS: None   WEIGHT BEARING RESTRICTIONS: No  FALLS:  Has patient fallen in last 6 months? Yes. Number of falls 1, tripped over bedding   PLOF: Independent with basic ADLs  PATIENT GOALS: reduce pain, improve activity  NEXT MD VISIT: nothing scheduled at eval  OBJECTIVE:  Note: Objective measures were completed at Evaluation unless otherwise noted.  DIAGNOSTIC FINDINGS:  XR in chart  PATIENT SURVEYS:  03/27/2023: FOTO update:  67  Eval: FOTO 61% functional, goal is 66%  COGNITION: 02/14/2023 Overall cognitive status: Within functional limits for tasks assessed     SENSATION: 02/14/2023 [facility]  MUSCLE LENGTH: 02/14/2023 Hamstrings: tight bilat Thomas test: tight bilat  LUMBAR ROM:   AROM 02/14/2023 03/27/2023  Flexion 50% To mid shin c back discomfort  Extension 50% 75% WFL  Right lateral flexion    Left lateral flexion    Right rotation 25%   Left rotation 25%    (Blank rows = not tested)    LOWER EXTREMITY MMT:    MMT Right 02/14/2023 Left 02/14/2023  Hip flexion [facility]  Hip extension    Hip abduction [facility]  Hip adduction    Hip internal rotation    Hip external rotation    Knee flexion [facility] WFL  Knee extension WFL WFL   (Blank rows = not  tested)  FUNCTIONAL TESTS:  03/27/2023: TUG independent 12.46 seconds   02/14/2023 Timed up and go (TUG): 17:01 sec                  TODAY'S TREATMENT:                                                                     DATE: 03/27/2023 TherEx  NuStep Lvl 5  x 10 min Leg Press 50# 2x15; single limb 25# 2x15 bil Standing hip extension x 15 bilateral with light hand touch on bar Standing hip abduction x 15 bilateral with light hand touch on bar  Additional time spent in review of gym based options for replication of things in clinic.  Verbal cues given on techniques and setup up.    Neuro Re-ed Tandem stance 1 min x 2 bilateral with time spent on verbal cues     TODAY'S TREATMENT:                                                                    DATE: 03/22/23 TherEx  NuStep L6 x 8 min Leg Press 50# 2x10; single limb 25# 2x10 bil Standing hip ext x20 bil Standing hip abduction x20 bil Tandem balance 30 sec X 3 bilat Cervical rotation stretch AAROM with towel 5 sec X 5 bilat Upper trap stretch 3x10 sec bil   TODAY'S TREATMENT:                                                                    DATE: 03/20/2023 Precor bike L1 X 8 min Slantboard stretch 30 sec X 3 Leg press DL 16# 1W96, then 04# SL X 10 each side Alternating unilat UE rows red X 15 each Standing lumbar L stretch 5 sec X 10 Sidestepping 3 round trips at counter without UE support Standing hip extensions X 10 bilat Standing marches X 10 bilat Standing hip abduction X 10 bilat Tandem balance 30 sec X 3 bilat Cervical rotation stretch AAROM with towel 5 sec X 5 bilat Upper trap stretch 3 X 20 sec bilat   PATIENT EDUCATION: Education details: HEP, PT plan of care Person educated: Patient Education method: Explanation, Demonstration, Verbal cues, and Handouts Education comprehension: verbalized understanding and needs further education   HOME EXERCISE PROGRAM: Access Code: [email or web address] URL: [email or web address] Date: 02/14/2023 Prepared by: Ivery Quale  Exercises - Standing 'L' Stretch at Counter  - 2 x daily - 6 x weekly - 1 sets - 10 reps - 5 hold - Side Stepping  with Counter Support  - 2 x daily - 6 x weekly - 1 sets - 5 reps - alternating marching  - 2 x daily - 6 x weekly - 1 sets - 15-20 reps - Standing Hip Extension  with Counter Support  - 2 x daily - 6 x weekly - 1 sets - 15-20 reps - Standing Tandem Balance with Counter Support  - 2 x daily - 6 x weekly - 1 sets - 3 reps - 30 hold - Standing Scapular Retraction with External Rotation  - 2 x daily - 6 x weekly - 1 sets - 10 reps - 5 sec hold - Standing Lumbar Extension  - 2 x daily - 6 x weekly - 1 sets - 10 reps - 5 sec hold - Seated Assisted Cervical Rotation with Towel  - 2 x daily - 6 x weekly - 1 sets - 10 reps - 5 hold  ASSESSMENT:  CLINICAL IMPRESSION:  FOTO reassessment showed improvement as noted in objective data.  Generally reporting lesser symptom involvement related to condition.   OBJECTIVE IMPAIRMENTS: decreased activity tolerance, difficulty walking, decreased balance, decreased endurance, decreased mobility, decreased ROM, decreased strength, impaired flexibility, impaired UE/LE use, postural dysfunction, and pain.  ACTIVITY LIMITATIONS: bending, lifting, carry, locomotion, cleaning, community activity, driving  PERSONAL FACTORS: see above PMH are also affecting patient's functional outcome.  REHAB POTENTIAL: Good  CLINICAL DECISION MAKING: Stable/uncomplicated  EVALUATION COMPLEXITY: Low    GOALS: Short term PT Goals Target date: 03/14/2023   Pt will be I and compliant with HEP. Baseline:  Goal status: Met 03/20/23  Pt will decrease pain by 25% overall Baseline: Goal status:  Met 03/20/23  Long term PT goals Target date:04/11/2023   Pt will improve ROM to [facility] to improve functional mobility Baseline: Goal status: ongoing 03/20/23 Pt will improve  hip/knee strength to at least 5-/5 MMT to improve functional strength Baseline: Goal status: ongoing 03/20/23 Pt will improve FOTO to at least % functional to show improved function Baseline: Goal status: ongoing  03/20/23 Pt will reduce pain by overall 50% overall with usual activity Baseline: Goal status: ongoing 03/20/23 Pt will reduce pain to overall less than 2-3/10 with usual activity and work activity. Baseline: Goal status:ongoing 03/20/23 Pt will be able to ambulate community distances at least 1000 ft WNL gait pattern without complaints Baseline: Goal status:ongoing 03/20/23  PLAN: PT FREQUENCY: 1-2 times per week   PT DURATION: 6-8 weeks  PLANNED INTERVENTIONS (unless contraindicated): aquatic PT, Canalith repositioning, cryotherapy, Electrical stimulation, Iontophoresis with 4 mg/ml dexamethasome, Moist heat, traction, Ultrasound, gait training, Therapeutic exercise, balance training, neuromuscular re-education, patient/family education,  manual techniques, passive ROM, dry needling, taping, vasopnuematic device, vestibular, spinal manipulations, joint manipulations 97110-Therapeutic exercises, 97530- Therapeutic activity, 97112- Neuromuscular re-education, 97535- Self Care, [id number]- Manual therapy, [id number]- Gait training, and [phone]- Aquatic Therapy  PLAN FOR NEXT SESSION:  Possible HEP trial pending Pt preference.  Follow up on any gym return planning.    Chyrel Masson, PT, DPT, OCS, ATC 03/27/23  2:59 PM

## 2023-03-29 ENCOUNTER — Ambulatory Visit: Payer: PPO | Admitting: Physical Therapy

## 2023-03-29 ENCOUNTER — Encounter: Payer: Self-pay | Admitting: Physical Therapy

## 2023-03-29 DIAGNOSIS — M25552 Pain in left hip: Secondary | ICD-10-CM

## 2023-03-29 DIAGNOSIS — M546 Pain in thoracic spine: Secondary | ICD-10-CM | POA: Diagnosis not present

## 2023-03-29 DIAGNOSIS — M25551 Pain in right hip: Secondary | ICD-10-CM | POA: Diagnosis not present

## 2023-03-29 DIAGNOSIS — R2689 Other abnormalities of gait and mobility: Secondary | ICD-10-CM

## 2023-03-29 DIAGNOSIS — M5459 Other low back pain: Secondary | ICD-10-CM

## 2023-03-29 NOTE — Therapy (Addendum)
 OUTPATIENT PHYSICAL THERAPY TREATMENT/discharge PHYSICAL THERAPY DISCHARGE SUMMARY  Visits from Start of Care: 8  Current functional level related to goals / functional outcomes: See below   Remaining deficits: See below   Education / Equipment: HEP  Plan:  Patient goals were  met. Patient is being discharged due to not returning since last visit >30 days  Jamee Mazzoni, PT, DPT 06/11/23 2:57 PM    Patient Name: Bonnie Juarez MRN: [id number] DOB:23-Jun-1945, 78 y.o., female Today's Date: 03/29/2023  END OF SESSION:  PT End of Session - 03/29/23 1520     Visit Number 8    Number of Visits 15    Date for PT Re-Evaluation 04/11/23    Authorization Type [employer] $10 copay    Progress Note Due on Visit 10    PT Start Time 1430    PT Stop Time 1515    PT Time Calculation (min) 45 min    Activity Tolerance Patient tolerated treatment well    Behavior During Therapy WFL for tasks assessed/performed                  Past Medical History:  Diagnosis Date   Anxiety    Arthritis    Cancer (HCC)    Hx: of squamouscell carcinoma on Left shin basal cell on face; melanoma on right lower leg   Complication of anesthesia    11-24-03 deceased LOC and hypoxia overmedicating with Morphine and Phenergan; received Narcan X 2, overnight pulse ox   Depression    Fibromyalgia    Frequent urination    GERD (gastroesophageal reflux disease)    Hx: of   H/O hiatal hernia    PMH: of   Headache(784.0)    Hypertension    IBS (irritable bowel syndrome)    Hx: of   Melanoma (HCC)    Right leg   Osteopenia    Hx: of per pt, pt. adds osteoporosis- DOS   Pneumonia    hx   PONV (postoperative nausea and vomiting)    Past Surgical History:  Procedure Laterality Date   APPENDECTOMY     ARCUATE KERATECTOMY     BREAST LUMPECTOMY Right 1991   CERVICAL FUSION  1983   FRACTURE SURGERY Right 2014   humerus   HERNIA REPAIR     hiatal   KNEE ARTHROSCOPY Right 2015   NICEN FUNDO  PLACATION  2010   NISSEN FUNDOPLICATION N/A 04/13/2020   Procedure: EXPLORATION OF FORGUT WITH ENDOSCOPY AND HITAL HERNIA CLOSURE, UPPER ENDOSCOPY;  Surgeon: Jacolyn Matar, MD;  Location: [facility];  Service: General;  Laterality: N/A;   ORIF HUMERUS FRACTURE Right 01/08/2013   Procedure: OPEN REDUCTION INTERNAL FIXATION (ORIF) PROXIMAL HUMERUS FRACTURE;  Surgeon: Adah Acron, MD;  Location: [facility];  Service: Orthopedics;  Laterality: Right;  Open Reduction Internal Fixation Right Proximal Humerus Fracture   POSTERIOR CERVICAL FUSION/FORAMINOTOMY N/A 12/20/2016   Procedure: Posterior Cervical Fusion with lateral mass fixation - Cervical Three-Thoracic Two;  Surgeon: Isadora Mar, MD;  [facility];  Service: Neurosurgery;  Laterality: N/A;   RADIAL HEAD REPLACEMENT  2005   trigger thrumb release Left 2001   tubular plasity  1970   XI ROBOTIC ASSISTED HIATAL HERNIA REPAIR N/A 12/06/2020   Procedure: XI ROBOTIC ASSISTED REDO HIATAL HERNIA REPAIR;  Surgeon: Jacolyn Matar, MD;  Location: [facility];  Service: General;  Laterality: N/A;   Patient Active Problem List   Diagnosis Date Noted   Gastroparesis 12/06/2020   History  of repair of hiatal hernia 04/13/2020   Plantar fasciitis of left foot 10/02/2018   S/P lumbar fusion 02/06/2018   Mixed dyslipidemia 11/13/2017   Family history of heart disease 11/13/2017   Statin intolerance 11/13/2017   Bilateral primary osteoarthritis of knee 10/30/2017   S/P cervical spinal fusion 12/20/2016   Spondylosis without myelopathy or radiculopathy, lumbar region 10/05/2016   Chronic bilateral low back pain without sciatica 10/05/2016   Fibromyalgia 10/05/2016   Closed fracture of right proximal humerus 01/08/2013    Class: Acute    PCP: Helyn Lobstein, MD   REFERRING PROVIDER: Adah Acron, MD   REFERRING DIAG: [phone] (ICD-10-CM) - Bilateral hip pain  Rationale for Evaluation and Treatment: Rehabilitation  THERAPY DIAG:  Pain in  left hip  Pain in right hip  Pain in thoracic spine  Other abnormalities of gait and mobility  Other low back pain  ONSET DATE: Chronic pain  SUBJECTIVE:                                                                                                                                                                                           SUBJECTIVE STATEMENT: She will transition to independent program but would like to keep PT episode open in case she wants to come back  PERTINENT HISTORY:   Low back pain, fusion L4-5 in 2019, disc protrusions L2-3 and L3-4 PMH: lumbar and cervical fusion, fibromyalgia,knee OA,  PAIN:  NPRS scale: upon arrival 0/10 Pain location:back and down her legs Pain description: constant but the intense pain comes and goes Aggravating factors: turning her head, standing,  Relieving factors: rest, meds  PRECAUTIONS: None  RED FLAGS: None   WEIGHT BEARING RESTRICTIONS: No  FALLS:  Has patient fallen in last 6 months? Yes. Number of falls 1, tripped over bedding   PLOF: Independent with basic ADLs  PATIENT GOALS: reduce pain, improve activity  NEXT MD VISIT: nothing scheduled at eval  OBJECTIVE:  Note: Objective measures were completed at Evaluation unless otherwise noted.  DIAGNOSTIC FINDINGS:  XR in chart  PATIENT SURVEYS:  03/27/2023: FOTO update:  67  Eval: FOTO 61% functional, goal is 66%  COGNITION: 02/14/2023 Overall cognitive status: Within functional limits for tasks assessed     SENSATION: 02/14/2023 [facility]  MUSCLE LENGTH: 02/14/2023 Hamstrings: tight bilat Thomas test: tight bilat  LUMBAR ROM:   AROM 02/14/2023 03/27/2023  Flexion 50% To mid shin c back discomfort  Extension 50% 75% WFL  Right lateral flexion    Left lateral flexion    Right rotation 25%   Left rotation 25%    (Blank rows = not tested)  LOWER EXTREMITY MMT:    MMT Right 02/14/2023 Left 02/14/2023  Hip flexion [facility]  Hip extension     Hip abduction [facility]  Hip adduction    Hip internal rotation    Hip external rotation    Knee flexion [facility] WFL  Knee extension WFL WFL   (Blank rows = not tested)  FUNCTIONAL TESTS:  03/27/2023: TUG independent 12.46 seconds   02/14/2023 Timed up and go (TUG): 17:01 sec                  TODAY'S TREATMENT:                                                                     DATE: 03/29/23 TherEx  NuStep L6 x 8 min Leg Press 50# 2x10; single limb 25# 2x10 bil Row machine 5# 2X5 Attempted chest press machine at lowest 5# setting, too heavy for her Knee extension [place] DL 1#6X09 Knee flexion machine DL 15 #6E45 Tandem balance 30 sec X 3 bilat Cervical rotation stretch AAROM with towel 5 sec X 5 bilat Reviewed gym equipment and PT plan of care policy that we can hold PT up to 30 days  DATE: 03/27/2023 TherEx  NuStep Lvl 5  x 10 min Leg Press 50# 2x15; single limb 25# 2x15 bil Standing hip extension x 15 bilateral with light hand touch on bar Standing hip abduction x 15 bilateral with light hand touch on bar  Additional time spent in review of gym based options for replication of things in clinic.  Verbal cues given on techniques and setup up.    Neuro Re-ed Tandem stance 1 min x 2 bilateral with time spent on verbal cues     PATIENT EDUCATION: Education details: HEP, PT plan of care Person educated: Patient Education method: Explanation, Demonstration, Verbal cues, and Handouts Education comprehension: verbalized understanding and needs further education   HOME EXERCISE PROGRAM: Access Code: [email or web address] URL: [email or web address] Date: 02/14/2023 Prepared by: Jamee Mazzoni  Exercises - Standing 'L' Stretch at Counter  - 2 x daily - 6 x weekly - 1 sets - 10 reps - 5 hold - Side Stepping with Counter Support  - 2 x daily - 6 x weekly - 1 sets - 5 reps - alternating marching  - 2 x daily - 6 x weekly - 1 sets - 15-20 reps - Standing Hip Extension with  Counter Support  - 2 x daily - 6 x weekly - 1 sets - 15-20 reps - Standing Tandem Balance with Counter Support  - 2 x daily - 6 x weekly - 1 sets - 3 reps - 30 hold - Standing Scapular Retraction with External Rotation  - 2 x daily - 6 x weekly - 1 sets - 10 reps - 5 sec hold - Standing Lumbar Extension  - 2 x daily - 6 x weekly - 1 sets - 10 reps - 5 sec hold - Seated Assisted Cervical Rotation with Towel  - 2 x daily - 6 x weekly - 1 sets - 10 reps - 5 hold  ASSESSMENT:  CLINICAL IMPRESSION:  She has made good progress with PT.  She will work on independent program and plans to join gym. We will keep PT  open for 30 days in the event she needs to return  OBJECTIVE IMPAIRMENTS: decreased activity tolerance, difficulty walking, decreased balance, decreased endurance, decreased mobility, decreased ROM, decreased strength, impaired flexibility, impaired UE/LE use, postural dysfunction, and pain.  ACTIVITY LIMITATIONS: bending, lifting, carry, locomotion, cleaning, community activity, driving  PERSONAL FACTORS: see above PMH are also affecting patient's functional outcome.  REHAB POTENTIAL: Good  CLINICAL DECISION MAKING: Stable/uncomplicated  EVALUATION COMPLEXITY: Low    GOALS: Short term PT Goals Target date: 03/14/2023   Pt will be I and compliant with HEP. Baseline:  Goal status: Met 03/20/23  Pt will decrease pain by 25% overall Baseline: Goal status:  Met 03/20/23  Long term PT goals Target date:04/11/2023   Pt will improve ROM to [facility] to improve functional mobility Baseline: Goal status: MET 03/29/23 Pt will improve  hip/knee strength to at least 5-/5 MMT to improve functional strength Baseline: Goal status: ongoing  03/29/23 Pt will improve FOTO to at least 66% functional to show improved function Baseline: Goal status: MET 03/29/23 Pt will reduce pain by overall 50% overall with usual activity Baseline: Goal status: MET 03/29/23 Pt will reduce pain to overall less  than 2-3/10 with usual activity and work activity. Baseline: Goal status:ongoing 03/29/23 Pt will be able to ambulate community distances at least 1000 ft WNL gait pattern without complaints Baseline: Goal status:ongoing 03/29/23  PLAN: PT FREQUENCY: 1-2 times per week   PT DURATION: 6-8 weeks  PLANNED INTERVENTIONS (unless contraindicated): aquatic PT, Canalith repositioning, cryotherapy, Electrical stimulation, Iontophoresis with 4 mg/ml dexamethasome, Moist heat, traction, Ultrasound, gait training, Therapeutic exercise, balance training, neuromuscular re-education, patient/family education,  manual techniques, passive ROM, dry needling, taping, vasopnuematic device, vestibular, spinal manipulations, joint manipulations 97110-Therapeutic exercises, 97530- Therapeutic activity, 97112- Neuromuscular re-education, 97535- Self Care, 16109- Manual therapy, [phone]- Gait training, and [phone]- Aquatic Therapy  PLAN FOR NEXT SESSION:  Hold PT for 30 days, will DC if no return after that  Jamee Mazzoni, PT, DPT 03/29/23 3:25 PM

## 2023-04-09 ENCOUNTER — Ambulatory Visit: Payer: PPO | Admitting: Nurse Practitioner

## 2023-05-08 DIAGNOSIS — E782 Mixed hyperlipidemia: Secondary | ICD-10-CM | POA: Diagnosis not present

## 2023-05-08 DIAGNOSIS — F332 Major depressive disorder, recurrent severe without psychotic features: Secondary | ICD-10-CM | POA: Diagnosis not present

## 2023-05-08 DIAGNOSIS — I10 Essential (primary) hypertension: Secondary | ICD-10-CM | POA: Diagnosis not present

## 2023-05-08 DIAGNOSIS — J309 Allergic rhinitis, unspecified: Secondary | ICD-10-CM | POA: Diagnosis not present

## 2023-05-08 DIAGNOSIS — K219 Gastro-esophageal reflux disease without esophagitis: Secondary | ICD-10-CM | POA: Diagnosis not present

## 2023-05-08 DIAGNOSIS — N3941 Urge incontinence: Secondary | ICD-10-CM | POA: Diagnosis not present

## 2023-05-08 DIAGNOSIS — M810 Age-related osteoporosis without current pathological fracture: Secondary | ICD-10-CM | POA: Diagnosis not present

## 2023-05-08 DIAGNOSIS — Z6824 Body mass index (BMI) 24.0-24.9, adult: Secondary | ICD-10-CM | POA: Diagnosis not present

## 2023-06-14 DIAGNOSIS — J301 Allergic rhinitis due to pollen: Secondary | ICD-10-CM | POA: Diagnosis not present

## 2023-07-10 ENCOUNTER — Other Ambulatory Visit (INDEPENDENT_AMBULATORY_CARE_PROVIDER_SITE_OTHER): Payer: Self-pay

## 2023-07-10 ENCOUNTER — Ambulatory Visit: Admitting: Family

## 2023-07-10 DIAGNOSIS — S9032XA Contusion of left foot, initial encounter: Secondary | ICD-10-CM | POA: Diagnosis not present

## 2023-07-10 DIAGNOSIS — M1711 Unilateral primary osteoarthritis, right knee: Secondary | ICD-10-CM | POA: Diagnosis not present

## 2023-07-10 DIAGNOSIS — M79672 Pain in left foot: Secondary | ICD-10-CM

## 2023-07-10 NOTE — Progress Notes (Unsigned)
 Office Visit Note   Patient: Bonnie Juarez           Date of Birth: 05-Jun-1945           MRN: [id number] Visit Date: 07/10/2023              Requested by: Helyn Lobstein, MD [address] [place],  [place] [address] PCP: Helyn Lobstein, MD  Chief Complaint  Patient presents with   Left Foot - Pain   Right Knee - Pain      HPI: The patient is a 78 year old woman who presents today complaining of foot pain on the left over the dorsum of her foot after dropping a heavy can of food on the dorsum of her foot.  She has had aching pain and some mild swelling she reports this has been gradually improving.  The pain is mainly aggravated by pressure or palpation of the dorsum of the foot minimal pain with weightbearing  She is also complaining of right knee pain which is chronic.  She has had medial pain pain with weightbearing she denies any mechanical symptoms.  She has previously had Depo-Medrol  injections with Dr. Murrel Arnt in the past which have worked well for her.  She is requesting repeat injection today. Assessment & Plan: Visit Diagnoses:  1. Pain in left foot   2. Contusion of left foot, initial encounter   3. Primary osteoarthritis of right knee     Plan: Contusion left foot.  Discussed expected course.  Depo-Medrol  injection right knee.  Patient tolerated well.  She will follow-up in the office as needed.  Follow-Up Instructions: No follow-ups on file.   Right Knee Exam   Tenderness  The patient is experiencing tenderness in the medial joint line.  Range of Motion  The patient has normal right knee ROM.  Tests  Varus: negative Valgus: negative  Other  Swelling: mild Effusion: effusion present      Patient is alert, oriented, no adenopathy, well-dressed, normal affect, normal respiratory effort. On examination left foot there is trace edema no erythema no ecchymosis present.  She has diffuse tenderness to the dorsum of her foot there is no deformity no plantar  tenderness palpable dorsalis pedis pulse.  Imaging: No results found. No images are attached to the encounter.  Labs: No results found for: "HGBA1C", "ESRSEDRATE", "CRP", "LABURIC", "REPTSTATUS", "GRAMSTAIN", "CULT", "LABORGA"   Lab Results  Component Value Date   ALBUMIN 3.9 03/07/2023   ALBUMIN 3.9 01/06/2013   ALBUMIN 3.2 (L) 09/04/2012    No results found for: "MG" No results found for: "VD25OH"  No results found for: "PREALBUMIN"    Latest Ref Rng & Units 03/07/2023    5:15 PM 03/02/2022    4:43 PM 12/06/2020    6:27 PM  CBC EXTENDED  WBC 3.4 - 10.8 x10E3/uL 8.3  7.3  9.6   RBC 3.77 - 5.28 x10E6/uL 4.33  4.31  4.65   Hemoglobin 11.1 - 15.9 g/dL 95.2  84.1  32.4   HCT 34.0 - 46.6 % 38.5  37.3  42.0   Platelets 150 - 450 x10E3/uL 284  225  202      There is no height or weight on file to calculate BMI.  Orders:  Orders Placed This Encounter  Procedures   XR Foot 2 Views Left   No orders of the defined types were placed in this encounter.    Procedures: Large Joint Inj: R knee on 07/10/2023 10:59 AM Indications: pain Details:  18 G 1.5 in needle, anteromedial approach Medications: 5 mL lidocaine  1 %; 40 mg methylPREDNISolone  acetate 40 MG/ML Consent was given by the patient.      Clinical Data: No additional findings.  ROS:  All other systems negative, except as noted in the HPI. Review of Systems  Objective: Vital Signs: There were no vitals taken for this visit.  Specialty Comments:  No specialty comments available.  PMFS History: Patient Active Problem List   Diagnosis Date Noted   Gastroparesis 12/06/2020   History of repair of hiatal hernia 04/13/2020   Plantar fasciitis of left foot 10/02/2018   S/P lumbar fusion 02/06/2018   Mixed dyslipidemia 11/13/2017   Family history of heart disease 11/13/2017   Statin intolerance 11/13/2017   Bilateral primary osteoarthritis of knee 10/30/2017   S/P cervical spinal fusion 12/20/2016    Spondylosis without myelopathy or radiculopathy, lumbar region 10/05/2016   Chronic bilateral low back pain without sciatica 10/05/2016   Fibromyalgia 10/05/2016   Closed fracture of right proximal humerus 01/08/2013    Class: Acute   Past Medical History:  Diagnosis Date   Anxiety    Arthritis    Cancer (HCC)    Hx: of squamouscell carcinoma on Left shin basal cell on face; melanoma on right lower leg   Complication of anesthesia    December 02, 2003 deceased LOC and hypoxia overmedicating with Morphine  and Phenergan; received Narcan X 2, overnight pulse ox   Depression    Fibromyalgia    Frequent urination    GERD (gastroesophageal reflux disease)    Hx: of   H/O hiatal hernia    PMH: of   Headache(784.0)    Hypertension    IBS (irritable bowel syndrome)    Hx: of   Melanoma (HCC)    Right leg   Osteopenia    Hx: of per pt, pt. adds osteoporosis- DOS   Pneumonia    hx   PONV (postoperative nausea and vomiting)     Family History  Problem Relation Age of Onset   Heart disease Other    Lung disease Other    Breast cancer Neg Hx     Past Surgical History:  Procedure Laterality Date   APPENDECTOMY     ARCUATE KERATECTOMY     BREAST LUMPECTOMY Right 1991   CERVICAL FUSION  1983   FRACTURE SURGERY Right 2014   humerus   HERNIA REPAIR     hiatal   KNEE ARTHROSCOPY Right 2015   NICEN FUNDO PLACATION  2010   NISSEN FUNDOPLICATION N/A 04/13/2020   Procedure: EXPLORATION OF FORGUT WITH ENDOSCOPY AND HITAL HERNIA CLOSURE, UPPER ENDOSCOPY;  Surgeon: Jacolyn Matar, MD;  Location: [facility];  Service: General;  Laterality: N/A;   ORIF HUMERUS FRACTURE Right 01/08/2013   Procedure: OPEN REDUCTION INTERNAL FIXATION (ORIF) PROXIMAL HUMERUS FRACTURE;  Surgeon: Adah Acron, MD;  Location: [facility];  Service: Orthopedics;  Laterality: Right;  Open Reduction Internal Fixation Right Proximal Humerus Fracture   POSTERIOR CERVICAL FUSION/FORAMINOTOMY N/A 12/20/2016   Procedure: Posterior Cervical  Fusion with lateral mass fixation - Cervical Three-Thoracic Two;  Surgeon: Isadora Mar, MD;  Location: [facility] OR;  Service: Neurosurgery;  Laterality: N/A;   RADIAL HEAD REPLACEMENT  2005   trigger thrumb release Left 2001   tubular plasity  1970   XI ROBOTIC ASSISTED HIATAL HERNIA REPAIR N/A 12/06/2020   Procedure: XI ROBOTIC ASSISTED REDO HIATAL HERNIA REPAIR;  Surgeon: Jacolyn Matar, MD;  Location: [facility];  Service: General;  Laterality: N/A;   Social History   Occupational History   Not on file  Tobacco Use   Smoking status: Former    Current packs/day: 0.00    Average packs/day: 1 pack/day for 15.0 years (15.0 ttl pk-yrs)    Types: Cigarettes    Start date: 12/12/1965    Quit date: 12/12/1980    Years since quitting: 42.6   Smokeless tobacco: Never   Tobacco comments:    Quit 1982"  Vaping Use   Vaping status: Never Used  Substance and Sexual Activity   Alcohol use: Yes    Alcohol/week: 7.0 - 10.0 standard drinks of alcohol    Types: 7 - 10 Glasses of wine per week    Comment: occ glass of owine   Drug use: No   Sexual activity: Not on file

## 2023-07-11 ENCOUNTER — Encounter: Payer: Self-pay | Admitting: Family

## 2023-07-11 MED ORDER — LIDOCAINE HCL 1 % IJ SOLN
5.0000 mL | INTRAMUSCULAR | Status: AC | PRN
Start: 2023-07-10 — End: 2023-07-10
  Administered 2023-07-10: 5 mL

## 2023-07-11 MED ORDER — METHYLPREDNISOLONE ACETATE 40 MG/ML IJ SUSP
40.0000 mg | INTRAMUSCULAR | Status: AC | PRN
  Administered 2023-07-10: 40 mg via INTRA_ARTICULAR

## 2023-07-18 DIAGNOSIS — H04123 Dry eye syndrome of bilateral lacrimal glands: Secondary | ICD-10-CM | POA: Diagnosis not present

## 2023-07-18 DIAGNOSIS — H5213 Myopia, bilateral: Secondary | ICD-10-CM | POA: Diagnosis not present

## 2023-07-18 DIAGNOSIS — H18593 Other hereditary corneal dystrophies, bilateral: Secondary | ICD-10-CM | POA: Diagnosis not present

## 2023-07-18 DIAGNOSIS — Z961 Presence of intraocular lens: Secondary | ICD-10-CM | POA: Diagnosis not present

## 2023-07-26 DIAGNOSIS — F332 Major depressive disorder, recurrent severe without psychotic features: Secondary | ICD-10-CM | POA: Diagnosis not present

## 2023-07-26 DIAGNOSIS — F411 Generalized anxiety disorder: Secondary | ICD-10-CM | POA: Diagnosis not present

## 2023-07-26 DIAGNOSIS — F4312 Post-traumatic stress disorder, chronic: Secondary | ICD-10-CM | POA: Diagnosis not present

## 2023-08-30 DIAGNOSIS — F4312 Post-traumatic stress disorder, chronic: Secondary | ICD-10-CM | POA: Diagnosis not present

## 2023-08-30 DIAGNOSIS — F332 Major depressive disorder, recurrent severe without psychotic features: Secondary | ICD-10-CM | POA: Diagnosis not present

## 2023-08-30 DIAGNOSIS — F411 Generalized anxiety disorder: Secondary | ICD-10-CM | POA: Diagnosis not present

## 2023-10-01 ENCOUNTER — Emergency Department (HOSPITAL_COMMUNITY)
Admission: EM | Admit: 2023-10-01 | Discharge: 2023-10-02 | Disposition: A | Discharge status: Home / Self Care | Attending: Emergency Medicine | Admitting: Emergency Medicine

## 2023-10-01 ENCOUNTER — Ambulatory Visit (INDEPENDENT_AMBULATORY_CARE_PROVIDER_SITE_OTHER): Admitting: Physician Assistant

## 2023-10-01 ENCOUNTER — Other Ambulatory Visit: Payer: Self-pay

## 2023-10-01 ENCOUNTER — Encounter (HOSPITAL_COMMUNITY): Payer: Self-pay | Admitting: Emergency Medicine

## 2023-10-01 ENCOUNTER — Encounter: Admitting: Orthopedic Surgery

## 2023-10-01 DIAGNOSIS — M25569 Pain in unspecified knee: Secondary | ICD-10-CM

## 2023-10-01 DIAGNOSIS — Z859 Personal history of malignant neoplasm, unspecified: Secondary | ICD-10-CM | POA: Insufficient documentation

## 2023-10-01 DIAGNOSIS — R55 Syncope and collapse: Secondary | ICD-10-CM | POA: Insufficient documentation

## 2023-10-01 DIAGNOSIS — E162 Hypoglycemia, unspecified: Secondary | ICD-10-CM | POA: Insufficient documentation

## 2023-10-01 DIAGNOSIS — R42 Dizziness and giddiness: Secondary | ICD-10-CM | POA: Diagnosis not present

## 2023-10-01 DIAGNOSIS — I10 Essential (primary) hypertension: Secondary | ICD-10-CM | POA: Insufficient documentation

## 2023-10-01 DIAGNOSIS — E11649 Type 2 diabetes mellitus with hypoglycemia without coma: Secondary | ICD-10-CM | POA: Diagnosis not present

## 2023-10-01 DIAGNOSIS — G4489 Other headache syndrome: Secondary | ICD-10-CM | POA: Diagnosis not present

## 2023-10-01 LAB — COMPREHENSIVE METABOLIC PANEL WITH GFR
ALT: 14 U/L (ref 0–44)
AST: 19 U/L (ref 15–41)
Albumin: 3.7 g/dL (ref 3.5–5.0)
Alkaline Phosphatase: 67 U/L (ref 38–126)
Anion gap: 9 (ref 5–15)
BUN: 12 mg/dL (ref 8–23)
CO2: 25 mmol/L (ref 22–32)
Calcium: 9.1 mg/dL (ref 8.9–10.3)
Chloride: 104 mmol/L (ref 98–111)
Creatinine, Ser: 0.91 mg/dL (ref 0.44–1.00)
GFR, Estimated: >60 mL/min (ref >60)
Glucose, Bld: 98 mg/dL (ref 70–99)
Potassium: 3.8 mmol/L (ref 3.5–5.1)
Sodium: 138 mmol/L (ref 135–145)
Total Bilirubin: 0.5 mg/dL (ref 0.0–1.2)
Total Protein: 6.5 g/dL (ref 6.5–8.1)

## 2023-10-01 LAB — URINALYSIS, ROUTINE W REFLEX MICROSCOPIC
Bilirubin Urine: NEGATIVE
Glucose, UA: NEGATIVE mg/dL
Hgb urine dipstick: NEGATIVE
Ketones, ur: NEGATIVE mg/dL
Leukocytes,Ua: NEGATIVE
Nitrite: NEGATIVE
Protein, ur: NEGATIVE mg/dL
Specific Gravity, Urine: 1.003 — ABNORMAL LOW (ref 1.005–1.030)
pH: 8 (ref 5.0–8.0)

## 2023-10-01 LAB — CBC
HCT: 37.8 % (ref 36.0–46.0)
Hemoglobin: 12.1 g/dL (ref 12.0–15.0)
MCH: 27.9 pg (ref 26.0–34.0)
MCHC: 32 g/dL (ref 30.0–36.0)
MCV: 87.3 fL (ref 80.0–100.0)
Platelets: 239 K/uL (ref 150–400)
RBC: 4.33 MIL/uL (ref 3.87–5.11)
RDW: 13 % (ref 11.5–15.5)
WBC: 9.4 K/uL (ref 4.0–10.5)
nRBC: 0 % (ref 0.0–0.2)

## 2023-10-01 LAB — CBG MONITORING, ED
Glucose-Capillary: 111 mg/dL — ABNORMAL HIGH (ref 70–99)
Glucose-Capillary: 121 mg/dL — ABNORMAL HIGH (ref 70–99)

## 2023-10-01 NOTE — Progress Notes (Signed)
 The patient came in today to discuss possible knee replacement.  However she was feeling shaky.  She also noted that she had fullness in her head not really headache to just a fullness behind her eyes.  She does report that she had eaten earlier today around 1:00 had potato soup.  Her glucose level was found to be 62.  Otherwise vitals were within normal limits.  Patient presented by herself and no family members close by.  She denies chest pain shortness of breath.  States she just did not feel right.  Therefore EMS was called and she was transported to the ER for further evaluation.

## 2023-10-01 NOTE — ED Triage Notes (Signed)
 Patient presents from her MD office. While there, she felt lightheadedness, dizziness, headache and a  CBG 62. She was given apple juice and chips.  EMS vitals: 154 CBG 143/76 BP 78 HR 18 RR 98.7 Temp 98% SPO2 on room air

## 2023-10-01 NOTE — ED Provider Notes (Signed)
 [place] EMERGENCY DEPARTMENT AT [facility] Provider Note   CSN: [id number] Arrival date & time: 10/01/23  1611     History {Add pertinent medical, surgical, social history, OB history to HPI:1} Chief Complaint  Patient presents with   Hypoglycemia    Bonnie Juarez is a 78 y.o. female with PMH as listed below who presents with ***.    Past Medical History:  Diagnosis Date   Anxiety    Arthritis    Cancer (HCC)    Hx: of squamouscell carcinoma on Left shin basal cell on face; melanoma on right lower leg   Complication of anesthesia    2003-12-05 deceased LOC and hypoxia overmedicating with Morphine  and Phenergan; received Narcan X 2, overnight pulse ox   Depression    Fibromyalgia    Frequent urination    GERD (gastroesophageal reflux disease)    Hx: of   H/O hiatal hernia    PMH: of   Headache(784.0)    Hypertension    IBS (irritable bowel syndrome)    Hx: of   Melanoma (HCC)    Right leg   Osteopenia    Hx: of per pt, pt. adds osteoporosis- DOS   Pneumonia    hx   PONV (postoperative nausea and vomiting)        Home Medications Prior to Admission medications   Medication Sig Start Date End Date Taking? Authorizing Provider  acetaminophen  (TYLENOL ) 500 MG tablet Take 500-1,000 mg by mouth every 6 (six) hours as needed for moderate pain.    [provider]  Ascorbic Acid (VITAMIN C) 1000 MG tablet Take 1,000 mg by mouth daily.    [provider]  cholecalciferol (VITAMIN D3) 25 MCG (1000 UNIT) tablet Take 1,000 Units by mouth every other day.    [provider]  Coenzyme Q10 (COQ10) 200 MG CAPS Take 200 mg by mouth daily.    [provider]  cromolyn (NASALCROM) 5.2 MG/ACT nasal spray Place 1 spray into both nostrils 2 (two) times daily as needed for allergies.    [provider]  denosumab  (PROLIA ) 60 MG/ML SOSY injection Inject 60 mg into the skin every 6 (six) months.    [provider]  doxepin  (SINEQUAN) 50 MG capsule Take 50 mg by mouth at bedtime.    [provider]  escitalopram  (LEXAPRO ) 20 MG tablet Take 40 mg by mouth at bedtime. 10/08/17   [provider]  EVENITY  105 MG/1. SOSY injection Inject 210 mg into the skin every 30 (thirty) days. 06/30/22   [provider]  Evolocumab  (REPATHA  SURECLICK) 140 MG/ML SOAJ Inject 140 mg into the skin every 14 (fourteen) days. 03/26/23   Daneen Damien BROCKS, NP  loratadine  (CLARITIN ) 10 MG tablet Take 10 mg by mouth daily.    [provider]  Polyethyl Glycol-Propyl Glycol (SYSTANE) 0.4-0.3 % SOLN Place 1-2 drops into both eyes 3 (three) times daily as needed (dry/irritated eyes).    [provider]  RABEprazole (ACIPHEX) 20 MG tablet Take 20 mg by mouth daily.    [provider]  traZODone (DESYREL) 100 MG tablet Take 100 mg by mouth at bedtime.  10/22/17   [provider]  vitamin B-12 (CYANOCOBALAMIN ) 1000 MCG tablet Take 1,000 mcg by mouth every other day.    [provider]      Allergies    Boniva [ibandronate], Dilaudid  [hydromorphone  hcl], Risedronate sodium, and Statins    Review of Systems   Review of  Systems A 10 point review of systems was performed and is negative unless otherwise reported in HPI.  Physical Exam Updated Vital Signs BP (!) 168/72   Pulse 66   Temp 97.6 F (36.4 C) (Oral)   Resp 16   SpO2 99%  Physical Exam General: Normal appearing {Desc; female/female:11659}, lying in bed.  HEENT: PERRLA, Sclera anicteric, MMM, trachea midline.  Cardiology: RRR, no murmurs/rubs/gallops. BL radial and DP pulses equal bilaterally.  Resp: Normal respiratory rate and effort. CTAB, no wheezes, rhonchi, crackles.  Abd: Soft, non-tender, non-distended. No rebound tenderness or guarding.  GU: Deferred. MSK: No peripheral edema or signs of trauma. Extremities without deformity or TTP. No cyanosis or clubbing. Skin: warm, dry. No rashes or lesions. Back: No  CVA tenderness Neuro: A&Ox4, CNs II-XII grossly intact. MAEs. Sensation grossly intact.  Psych: Normal mood and affect.   ED Results / Procedures / Treatments   Labs (all labs ordered are listed, but only abnormal results are displayed) Labs Reviewed  CBG MONITORING, ED - Abnormal; Notable for the following components:      Result Value   Glucose-Capillary 111 (*)    All other components within normal limits  CBG MONITORING, ED - Abnormal; Notable for the following components:   Glucose-Capillary 121 (*)    All other components within normal limits  COMPREHENSIVE METABOLIC PANEL WITH GFR  CBC  URINALYSIS, ROUTINE W REFLEX MICROSCOPIC  CBG MONITORING, ED    EKG None  Radiology No results found.  Procedures Procedures  {Document cardiac monitor, telemetry assessment procedure when appropriate:1}  Medications Ordered in ED Medications - No data to display  ED Course/ Medical Decision Making/ A&P                          Medical Decision Making Amount and/or Complexity of Data Reviewed Labs: ordered. Decision-making details documented in ED Course.    This patient presents to the ED for concern of ***, this involves an extensive number of treatment options, and is a complaint that carries with it a high risk of complications and morbidity.  I considered the following differential and admission for this acute, potentially life threatening condition.   MDM:    ***  Clinical Course as of 10/01/23 2337  Mon Oct 01, 2023  2112 Glucose-Capillary(!): 121 [HN]  2214 CBC wnl [HN]    Clinical Course User Index [HN] Franklyn Sid SAILOR, MD    Labs: I Ordered, and personally interpreted labs.  The pertinent results include:  ***  Imaging Studies ordered: I ordered imaging studies including *** I independently visualized and interpreted imaging. I agree with the radiologist interpretation  Additional history obtained from ***.  External records from outside source  obtained and reviewed including ***  Cardiac Monitoring: The patient was maintained on a cardiac monitor.  I personally viewed and interpreted the cardiac monitored which showed an underlying rhythm of: ***  Reevaluation: After the interventions noted above, I reevaluated the patient and found that they have :{resolved/improved/worsened:23923::improved}  Social Determinants of Health: ***  Disposition:  ***  Co morbidities that complicate the patient evaluation  Past Medical History:  Diagnosis Date   Anxiety    Arthritis    Cancer (HCC)    Hx: of squamouscell carcinoma on Left shin basal cell on face; melanoma on right lower leg   Complication of anesthesia    2003-11-18 deceased LOC and hypoxia overmedicating with Morphine  and Phenergan; received Narcan X 2, overnight  pulse ox   Depression    Fibromyalgia    Frequent urination    GERD (gastroesophageal reflux disease)    Hx: of   H/O hiatal hernia    PMH: of   Headache(784.0)    Hypertension    IBS (irritable bowel syndrome)    Hx: of   Melanoma (HCC)    Right leg   Osteopenia    Hx: of per pt, pt. adds osteoporosis- DOS   Pneumonia    hx   PONV (postoperative nausea and vomiting)      Medicines No orders of the defined types were placed in this encounter.   I have reviewed the patients home medicines and have made adjustments as needed  Problem List / ED Course: Problem List Items Addressed This Visit   None        {Document critical care time when appropriate:1} {Document review of labs and clinical decision tools ie heart score, Chads2Vasc2 etc:1}  {Document your independent review of radiology images, and any outside records:1} {Document your discussion with family members, caretakers, and with consultants:1} {Document social determinants of health affecting pt's care:1} {Document your decision making why or why not admission, treatments were needed:1}  This note was created using dictation  software, which may contain spelling or grammatical errors.

## 2023-10-02 LAB — CBG MONITORING, ED: Glucose-Capillary: 84 mg/dL (ref 70–99)

## 2023-10-02 NOTE — Discharge Instructions (Signed)
 Thank you for coming to [facility] Emergency Department. You were seen for nearly passing out, weakness, shaking, and low blood sugar. We did an exam, labs, and imaging, and these showed no acute findings. Please eat and drink well.  Please follow up with your primary care provider within 1 week.   Do not hesitate to return to the ED or call 911 if you experience: -Worsening symptoms -Chest pain, shortness of breath -Lightheadedness, passing out -Fevers/chills -Anything else that concerns you

## 2023-10-02 NOTE — ED Notes (Signed)
 Discharge instructions reviewed with patient. Patient questions answered and opportunity for education reviewed. Patient voices understanding of discharge instructions with no further questions. Patient ambulatory with steady gait to lobby.

## 2023-10-18 DIAGNOSIS — F332 Major depressive disorder, recurrent severe without psychotic features: Secondary | ICD-10-CM | POA: Diagnosis not present

## 2023-10-18 DIAGNOSIS — F411 Generalized anxiety disorder: Secondary | ICD-10-CM | POA: Diagnosis not present

## 2023-10-18 DIAGNOSIS — F4312 Post-traumatic stress disorder, chronic: Secondary | ICD-10-CM | POA: Diagnosis not present

## 2023-10-28 ENCOUNTER — Encounter (INDEPENDENT_AMBULATORY_CARE_PROVIDER_SITE_OTHER): Payer: Self-pay

## 2023-10-31 DIAGNOSIS — E782 Mixed hyperlipidemia: Secondary | ICD-10-CM | POA: Diagnosis not present

## 2023-10-31 DIAGNOSIS — Z79899 Other long term (current) drug therapy: Secondary | ICD-10-CM | POA: Diagnosis not present

## 2023-10-31 DIAGNOSIS — R7303 Prediabetes: Secondary | ICD-10-CM | POA: Diagnosis not present

## 2023-10-31 DIAGNOSIS — M810 Age-related osteoporosis without current pathological fracture: Secondary | ICD-10-CM | POA: Diagnosis not present

## 2023-11-06 DIAGNOSIS — Z01419 Encounter for gynecological examination (general) (routine) without abnormal findings: Secondary | ICD-10-CM | POA: Diagnosis not present

## 2023-11-06 DIAGNOSIS — J309 Allergic rhinitis, unspecified: Secondary | ICD-10-CM | POA: Diagnosis not present

## 2023-11-06 DIAGNOSIS — K219 Gastro-esophageal reflux disease without esophagitis: Secondary | ICD-10-CM | POA: Diagnosis not present

## 2023-11-06 DIAGNOSIS — R87612 Low grade squamous intraepithelial lesion on cytologic smear of cervix (LGSIL): Secondary | ICD-10-CM | POA: Diagnosis not present

## 2023-11-06 DIAGNOSIS — E162 Hypoglycemia, unspecified: Secondary | ICD-10-CM | POA: Diagnosis not present

## 2023-11-06 DIAGNOSIS — E782 Mixed hyperlipidemia: Secondary | ICD-10-CM | POA: Diagnosis not present

## 2023-11-06 DIAGNOSIS — R7309 Other abnormal glucose: Secondary | ICD-10-CM | POA: Diagnosis not present

## 2023-11-06 DIAGNOSIS — Z23 Encounter for immunization: Secondary | ICD-10-CM | POA: Diagnosis not present

## 2023-11-06 DIAGNOSIS — M810 Age-related osteoporosis without current pathological fracture: Secondary | ICD-10-CM | POA: Diagnosis not present

## 2023-11-06 DIAGNOSIS — N3941 Urge incontinence: Secondary | ICD-10-CM | POA: Diagnosis not present

## 2023-11-06 DIAGNOSIS — F322 Major depressive disorder, single episode, severe without psychotic features: Secondary | ICD-10-CM | POA: Diagnosis not present

## 2023-11-07 ENCOUNTER — Encounter: Payer: Self-pay | Admitting: Orthopaedic Surgery

## 2023-11-07 ENCOUNTER — Other Ambulatory Visit (INDEPENDENT_AMBULATORY_CARE_PROVIDER_SITE_OTHER): Payer: Self-pay

## 2023-11-07 ENCOUNTER — Ambulatory Visit (INDEPENDENT_AMBULATORY_CARE_PROVIDER_SITE_OTHER): Admitting: Orthopaedic Surgery

## 2023-11-07 DIAGNOSIS — M25561 Pain in right knee: Secondary | ICD-10-CM

## 2023-11-07 DIAGNOSIS — M25562 Pain in left knee: Secondary | ICD-10-CM | POA: Diagnosis not present

## 2023-11-07 DIAGNOSIS — G8929 Other chronic pain: Secondary | ICD-10-CM | POA: Diagnosis not present

## 2023-11-07 MED ORDER — METHYLPREDNISOLONE ACETATE 40 MG/ML IJ SUSP
40.0000 mg | INTRAMUSCULAR | Status: AC | PRN
  Administered 2023-11-07: 40 mg via INTRA_ARTICULAR

## 2023-11-07 MED ORDER — LIDOCAINE HCL 1 % IJ SOLN
3.0000 mL | INTRAMUSCULAR | Status: AC | PRN
  Administered 2023-11-07: 3 mL

## 2023-11-07 NOTE — Progress Notes (Signed)
 The patient is a 78 year old female who is being seen at the practice for some time now.  It has been many years.  She was a former patient of Dr. Barbarann.  She has known arthritis in both her knees and comes in today stating that the last injection she had in her right knee back in May did not really help her as long as she wanted to.  At some point she does wish to consider knee replacement surgery.  Her husband has passed away about a year and a half ago but she does have good family support.  She is a patient of Dr. Sari Cable.  She is also talk to her cardiologist and mentioned about the possibility of knee replacement surgery in the future.  We did talk about trying steroid injections in both knees again today.  She would still like to try her conservative course a little bit longer.  Last month in early August when she was in the office her blood sugars went way down and she had to be sent to the emergency room.  She walks without any assistive device.  Examination of both knees today shows no effusion.  There is pain throughout the arc of motion of both knees with patellofemoral crepitation.  Previous x-rays of both knees shows arthritic changes with joint space narrowing and osteophytes especially the patellofemoral joint.  We talked in length in detail about considering knee replacement surgery at some point especially for right knee which has been using the more painful knee.  She did again request 2 injections in both knees today which we agreed to and she tolerated well.  Will see her back in 3 months to see how she is doing overall.    Procedure Note  Patient: Bonnie Juarez             Date of Birth: 1946-01-12           MRN: [id number]             Visit Date: 11/07/2023  Procedures: Visit Diagnoses:  1. Chronic pain of right knee   2. Chronic pain of left knee     Large Joint Inj: R knee on 11/07/2023 2:54 PM Indications: diagnostic evaluation and pain Details: 22 G 1.5 in needle,  superolateral approach  Arthrogram: No  Medications: 3 mL lidocaine  1 %; 40 mg methylPREDNISolone  acetate 40 MG/ML Outcome: tolerated well, no immediate complications Procedure, treatment alternatives, risks and benefits explained, specific risks discussed. Consent was given by the patient. Immediately prior to procedure a time out was called to verify the correct patient, procedure, equipment, support staff and site/side marked as required. Patient was prepped and draped in the usual sterile fashion.    Large Joint Inj: L knee on 11/07/2023 2:54 PM Indications: diagnostic evaluation and pain Details: 22 G 1.5 in needle, superolateral approach  Arthrogram: No  Medications: 3 mL lidocaine  1 %; 40 mg methylPREDNISolone  acetate 40 MG/ML Outcome: tolerated well, no immediate complications Procedure, treatment alternatives, risks and benefits explained, specific risks discussed. Consent was given by the patient. Immediately prior to procedure a time out was called to verify the correct patient, procedure, equipment, support staff and site/side marked as required. Patient was prepped and draped in the usual sterile fashion.

## 2023-11-12 DIAGNOSIS — Z0000 Encounter for general adult medical examination without abnormal findings: Secondary | ICD-10-CM | POA: Diagnosis not present

## 2023-12-04 DIAGNOSIS — I10 Essential (primary) hypertension: Secondary | ICD-10-CM | POA: Diagnosis not present

## 2023-12-04 DIAGNOSIS — R7303 Prediabetes: Secondary | ICD-10-CM | POA: Diagnosis not present

## 2023-12-04 DIAGNOSIS — K219 Gastro-esophageal reflux disease without esophagitis: Secondary | ICD-10-CM | POA: Diagnosis not present

## 2023-12-04 DIAGNOSIS — E782 Mixed hyperlipidemia: Secondary | ICD-10-CM | POA: Diagnosis not present

## 2023-12-04 DIAGNOSIS — Z713 Dietary counseling and surveillance: Secondary | ICD-10-CM | POA: Diagnosis not present

## 2023-12-05 DIAGNOSIS — F332 Major depressive disorder, recurrent severe without psychotic features: Secondary | ICD-10-CM | POA: Diagnosis not present

## 2023-12-05 DIAGNOSIS — F411 Generalized anxiety disorder: Secondary | ICD-10-CM | POA: Diagnosis not present

## 2023-12-05 DIAGNOSIS — F4312 Post-traumatic stress disorder, chronic: Secondary | ICD-10-CM | POA: Diagnosis not present

## 2023-12-11 DIAGNOSIS — E041 Nontoxic single thyroid nodule: Secondary | ICD-10-CM | POA: Diagnosis not present

## 2023-12-11 DIAGNOSIS — E782 Mixed hyperlipidemia: Secondary | ICD-10-CM | POA: Diagnosis not present

## 2023-12-11 DIAGNOSIS — M810 Age-related osteoporosis without current pathological fracture: Secondary | ICD-10-CM | POA: Diagnosis not present

## 2023-12-11 DIAGNOSIS — R7303 Prediabetes: Secondary | ICD-10-CM | POA: Diagnosis not present

## 2023-12-25 DIAGNOSIS — Z1231 Encounter for screening mammogram for malignant neoplasm of breast: Secondary | ICD-10-CM | POA: Diagnosis not present

## 2023-12-31 ENCOUNTER — Encounter: Payer: Self-pay | Admitting: Radiology

## 2024-01-28 DIAGNOSIS — L821 Other seborrheic keratosis: Secondary | ICD-10-CM | POA: Diagnosis not present

## 2024-01-28 DIAGNOSIS — L739 Follicular disorder, unspecified: Secondary | ICD-10-CM | POA: Diagnosis not present

## 2024-01-28 DIAGNOSIS — Z85828 Personal history of other malignant neoplasm of skin: Secondary | ICD-10-CM | POA: Diagnosis not present

## 2024-01-28 DIAGNOSIS — D225 Melanocytic nevi of trunk: Secondary | ICD-10-CM | POA: Diagnosis not present

## 2024-01-28 DIAGNOSIS — L578 Other skin changes due to chronic exposure to nonionizing radiation: Secondary | ICD-10-CM | POA: Diagnosis not present

## 2024-01-28 DIAGNOSIS — Z85820 Personal history of malignant melanoma of skin: Secondary | ICD-10-CM | POA: Diagnosis not present

## 2024-01-28 DIAGNOSIS — L814 Other melanin hyperpigmentation: Secondary | ICD-10-CM | POA: Diagnosis not present

## 2024-01-28 DIAGNOSIS — Z86018 Personal history of other benign neoplasm: Secondary | ICD-10-CM | POA: Diagnosis not present

## 2024-01-29 DIAGNOSIS — F411 Generalized anxiety disorder: Secondary | ICD-10-CM | POA: Diagnosis not present

## 2024-01-29 DIAGNOSIS — F332 Major depressive disorder, recurrent severe without psychotic features: Secondary | ICD-10-CM | POA: Diagnosis not present

## 2024-01-29 DIAGNOSIS — F4312 Post-traumatic stress disorder, chronic: Secondary | ICD-10-CM | POA: Diagnosis not present

## 2024-02-06 ENCOUNTER — Ambulatory Visit: Admitting: Orthopaedic Surgery

## 2024-02-06 ENCOUNTER — Encounter: Payer: Self-pay | Admitting: Orthopaedic Surgery

## 2024-02-06 DIAGNOSIS — M1711 Unilateral primary osteoarthritis, right knee: Secondary | ICD-10-CM | POA: Diagnosis not present

## 2024-02-06 DIAGNOSIS — G8929 Other chronic pain: Secondary | ICD-10-CM | POA: Diagnosis not present

## 2024-02-06 DIAGNOSIS — M25561 Pain in right knee: Secondary | ICD-10-CM | POA: Diagnosis not present

## 2024-02-06 NOTE — Progress Notes (Signed)
 The patient is a 78 year old female has been a longtime patient of this practice.  We saw her 3 months ago and placed her injections in both of her knees.  She has been getting steroid injections for many years now but it is gotten to the point where her knee pain with her right knee is severe and daily.  She does report a lot of knee swelling.  It is at the point where she does wish to proceed with knee replacement surgery.  Her knee pain is detrimentally affecting her mobility, her quality of life and her actives daily living.  She is a patient of Dr. Sari Cable who is her PCP and she has discussed this with Dr. Cable as well who agrees.  I did review the patient's past medical history and medications within epic.  She is not on blood thinning medications and is not a diabetic.  She does not have any cardiac issues.  She denies any chest pain or shortness of breath, fever, chills, nausea, vomiting.  On exam her right knee is swollen.  There is pain throughout the arc of motion of the right knee especially the medial and lateral joint lines and patellofemoral joint.  The knee is ligamentously stable but very painful throughout the motion of the knee.  Previous x-rays were reviewed again and shows bone-on-bone tricompartment arthritis of the right knee.  We had a long and thorough discussion about knee replacement surgery.  I showed her knee replacement model and discussed the risk and benefits of surgery and what to expect from an intraoperative and postoperative standpoint.  She does live alone but has good family support and said that her son will take a leave of absence when she is having surgery.  All questions and concerns were answered and addressed.  We will work on getting her scheduled for surgery sometime in the near future.

## 2024-03-06 NOTE — Progress Notes (Signed)
 Sent message, via epic in basket, requesting orders in epic from Careers adviser.

## 2024-03-12 NOTE — Progress Notes (Signed)
 COVID Vaccine received:  []  No [x]  Yes Date of any COVID positive Test in last 90 days: none  PCP - Sari Pay, MD  Cardiologist - K. Chad Hilty, MD   Chest x-ray - 01-30-2018  2v  EKG - 03-26-2023  Epic   Stress Test - 2014 ECHO - 04-18-2022  Cardiac Cath -  CT Coronary Calcium score: 80 on 11-21-2017  Epic ZIO monitor : 04-05-2022  Epic  Pacemaker / ICD device [x]  No []  Yes   Spinal Cord Stimulator:[x]  No []  Yes       History of Sleep Apnea? []  No [x]  Yes  mild CPAP used?- [x]  No []  Yes    Medication on DOS: Mirabegron  (Myrbetriq ), Escitalopram , Bupropion , Famotidine, Loratadine , Tylenol , Nasal Spray,   Eye drops,   REPATHA - q 14 days    Last: 03-07-24    Patient has: []  NO Hx DM   [x]  Pre-DM   []  DM1  []   DM2 Does the patient monitor blood sugar?   []  N/A   [x]  No []  Yes  Last A1c was: 5.7  on 11-04-2023       Blood Thinner / Instructions:  None Aspirin  Instructions: None  Activity level: Able to walk up 2 flights of stairs without becoming significantly short of breath or having chest pain?   [x]    Yes   []  No,  would have:  Patient can perform ADLs without assistance.  [x]   Yes    []  No   Anesthesia review: HTN,  Pre-DM  no Meds, Syncope, Mild CAD, Hx ACDF C5-6 (1983) and Posterior cervical Fusion-  12-20-2016 (C3-T2),  gastroparesis, PONV,  GERD, Anxiety, fibromyalgia  Patient denies any S&S of respiratory illness or Covid - no shortness of breath, fever, cough or chest pain at PAT appointment.  Patient verbalized understanding and agreement to the Pre-Surgical Instructions that were given to them at this PAT appointment. Patient was also educated of the need to review these PAT instructions again prior to her surgery.I reviewed the appropriate phone numbers to call if they have any and questions or concerns.

## 2024-03-13 ENCOUNTER — Encounter (HOSPITAL_COMMUNITY): Payer: Self-pay

## 2024-03-13 ENCOUNTER — Other Ambulatory Visit: Payer: Self-pay

## 2024-03-13 ENCOUNTER — Encounter (HOSPITAL_COMMUNITY)
Admission: RE | Admit: 2024-03-13 | Discharge: 2024-03-13 | Disposition: A | Discharge status: Home / Self Care | Source: Ambulatory Visit | Attending: Orthopaedic Surgery | Admitting: Orthopaedic Surgery

## 2024-03-13 VITALS — BP 150/74 | HR 62 | Temp 98.2°F | Resp 16 | Ht 66.0 in | Wt 160.0 lb

## 2024-03-13 DIAGNOSIS — I2510 Atherosclerotic heart disease of native coronary artery without angina pectoris: Secondary | ICD-10-CM | POA: Diagnosis not present

## 2024-03-13 DIAGNOSIS — Z981 Arthrodesis status: Secondary | ICD-10-CM | POA: Diagnosis not present

## 2024-03-13 DIAGNOSIS — I10 Essential (primary) hypertension: Secondary | ICD-10-CM | POA: Diagnosis not present

## 2024-03-13 DIAGNOSIS — Z01812 Encounter for preprocedural laboratory examination: Secondary | ICD-10-CM | POA: Diagnosis present

## 2024-03-13 DIAGNOSIS — M810 Age-related osteoporosis without current pathological fracture: Secondary | ICD-10-CM | POA: Diagnosis not present

## 2024-03-13 DIAGNOSIS — M1711 Unilateral primary osteoarthritis, right knee: Secondary | ICD-10-CM | POA: Insufficient documentation

## 2024-03-13 DIAGNOSIS — I951 Orthostatic hypotension: Secondary | ICD-10-CM | POA: Insufficient documentation

## 2024-03-13 DIAGNOSIS — K219 Gastro-esophageal reflux disease without esophagitis: Secondary | ICD-10-CM | POA: Diagnosis not present

## 2024-03-13 DIAGNOSIS — Z01818 Encounter for other preprocedural examination: Secondary | ICD-10-CM

## 2024-03-13 DIAGNOSIS — M797 Fibromyalgia: Secondary | ICD-10-CM | POA: Diagnosis not present

## 2024-03-13 DIAGNOSIS — Z87891 Personal history of nicotine dependence: Secondary | ICD-10-CM | POA: Diagnosis not present

## 2024-03-13 HISTORY — DX: Atherosclerotic heart disease of native coronary artery without angina pectoris: I25.10

## 2024-03-13 LAB — SURGICAL PCR SCREEN
MRSA, PCR: NEGATIVE
Staphylococcus aureus: POSITIVE — AB

## 2024-03-13 LAB — CBC
HCT: 38.7 % (ref 36.0–46.0)
Hemoglobin: 12.5 g/dL (ref 12.0–15.0)
MCH: 28.9 pg (ref 26.0–34.0)
MCHC: 32.3 g/dL (ref 30.0–36.0)
MCV: 89.6 fL (ref 80.0–100.0)
Platelets: 318 K/uL (ref 150–400)
RBC: 4.32 MIL/uL (ref 3.87–5.11)
RDW: 13.3 % (ref 11.5–15.5)
WBC: 8 K/uL (ref 4.0–10.5)
nRBC: 0 % (ref 0.0–0.2)

## 2024-03-13 LAB — BASIC METABOLIC PANEL WITH GFR
Anion gap: 9 (ref 5–15)
BUN: 12 mg/dL (ref 8–23)
CO2: 25 mmol/L (ref 22–32)
Calcium: 9.7 mg/dL (ref 8.9–10.3)
Chloride: 105 mmol/L (ref 98–111)
Creatinine, Ser: 0.97 mg/dL (ref 0.44–1.00)
GFR, Estimated: 59 mL/min — ABNORMAL LOW
Glucose, Bld: 102 mg/dL — ABNORMAL HIGH (ref 70–99)
Potassium: 4.7 mmol/L (ref 3.5–5.1)
Sodium: 139 mmol/L (ref 135–145)

## 2024-03-13 NOTE — Patient Instructions (Signed)
 SURGICAL WAITING ROOM VISITATION Patients having surgery or a procedure may have no more than 2 support people in the waiting area - these visitors may rotate in the visitor waiting room.   If the patient needs to stay at the hospital during part of their recovery, the visitor guidelines for inpatient rooms apply.  PRE-OP VISITATION  Pre-op nurse will coordinate an appropriate time for 1 support person to accompany the patient in pre-op.  This support person may not rotate.  This visitor will be contacted when the time is appropriate for the visitor to come back in the pre-op area.  Please refer to the [facility] website for the visitor guidelines for Inpatients (after your surgery is over and you are in a regular room).  Temporary Visitor Restrictions  Children ages 58 and under will not be able to visit patients in [facility] under most circumstances. Visitation is not restricted outside of hospitals unless noted otherwise in the [facility] and Location Specific Visitation Guidelines at :      [email or web address] Visitors with respiratory illnesses are discouraged from visiting and should remain at home.  You are not required to quarantine at this time prior to your surgery. However, you must do this: Hand Hygiene often Do NOT share personal items Notify your provider if you are in close contact with someone who has COVID or you develop fever 100.4 or greater, new onset of sneezing, cough, sore throat, shortness of breath or body aches.  If you test positive for Covid or have been in contact with anyone that has tested positive in the last 10 days please notify you surgeon.    Your procedure is scheduled on:  Friday  03-21-24  Report to [facility] Main Entrance: [address] entrance where the [employer] is available.   Report to admitting at: 11:15    AM  Call this number if you have any questions or problems the morning of surgery [phone]  Do not eat food  after Midnight the night prior to your surgery/procedure.  After Midnight you may have the following liquids until   10:45  AM DAY OF SURGERY  Clear Liquid Diet Water Black Coffee (sugar ok, NO MILK/CREAM OR CREAMERS)  Tea (sugar ok, NO MILK/CREAM OR CREAMERS) regular and decaf                             Plain Jell-O  with no fruit (NO RED)                                           Fruit ices (not with fruit pulp, NO RED)                                     Popsicles (NO RED)                                                                  Juice: NO CITRUS JUICES: only apple, WHITE grape, WHITE cranberry Sports drinks like Gatorade or Powerade (NO RED)  The day of surgery:  Drink ONE (1) Pre-Surgery Clear Ensure at  10:45 AM the morning of surgery. Drink in one sitting. Do not sip.  This drink was given to you during your hospital pre-op appointment visit. Nothing else to drink after completing the Pre-Surgery Clear Ensure : No candy, chewing gum or throat lozenges.    FOLLOW ANY ADDITIONAL PRE OP INSTRUCTIONS YOU RECEIVED FROM YOUR SURGEON'S OFFICE!!!   Oral Hygiene is also important to reduce your risk of infection.        Remember - BRUSH YOUR TEETH THE MORNING OF SURGERY WITH YOUR REGULAR TOOTHPASTE  Do NOT smoke after Midnight the night before surgery.  STOP TAKING all Vitamins, Herbs and supplements 1 week before your surgery.   Take ONLY these medicines the morning of surgery with A SIP OF WATER:  Mirabegron  (Myrbetriq ), Escitalopram , Bupropion , Famotidine, Loratadine  and you may take Tylenol  if needed. You may use your Nasal Spray and  Eye drops if needed.     REPATHA - q 14 days    Last:                   You may not have any metal on your body including hair pins, jewelry, and body piercing  Do not wear make-up, lotions, powders, perfumes  or deodorant  Do not wear nail polish including gel and S&S, artificial / acrylic nails, or any other type of  covering on natural nails including finger and toenails. If you have artificial nails, gel coating, etc., that needs to be removed by a nail salon, Please have this removed prior to surgery. Not doing so may mean that your surgery could be cancelled or delayed if the Surgeon or anesthesia staff feels like they are unable to monitor you safely.   Do not shave 48 hours prior to surgery to avoid nicks in your skin which may contribute to postoperative infections.   Contacts, Hearing Aids, dentures or bridgework may not be worn into surgery. DENTURES WILL BE REMOVED PRIOR TO SURGERY PLEASE DO NOT APPLY Poly grip OR ADHESIVES!!!  You may bring a small overnight bag with you on the day of surgery, only pack items that are not valuable. [facility] IS NOT RESPONSIBLE   FOR VALUABLES THAT ARE LOST OR STOLEN.   Do not bring your home medications to the hospital. The Pharmacy will dispense medications listed on your medication list to you during your admission in the Hospital.  Special Instructions: Bring a copy of your healthcare power of attorney and living will documents the day of surgery, if you wish to have them scanned into your [facility] Records- EPIC  Please read over the following fact sheets you were given: IF YOU HAVE QUESTIONS ABOUT YOUR PRE-OP INSTRUCTIONS, PLEASE CALL [phone].      Pre-operative 4 CHG Bath Instructions   You can play a key role in reducing the risk of infection after surgery. Your skin needs to be as free of germs as possible. You can reduce the number of germs on your skin by washing with CHG (chlorhexidine  gluconate) soap before surgery. CHG is an antiseptic soap that kills germs and continues to kill germs even after washing.   DO NOT use if you have an allergy to chlorhexidine /CHG or antibacterial soaps. If your skin becomes reddened or irritated, stop using the CHG and notify one of our RNs at [phone]  Please shower with the CHG soap starting 4  days before surgery using the following schedule:  Monday 03-17-24     Do NOT use CHG soap                                                                                                                      the morning of your                                                                                                                                 surgery.         Please keep in mind the following:  DO NOT shave, including legs and underarms, starting the day of your first shower.   You may shave your face at any point before/day of surgery.  Place clean sheets on your bed the day you start using CHG soap. Use a clean washcloth (not used since being washed) for each shower. DO NOT sleep with pets once you start using the CHG.  CHG Shower Instructions:  If you choose to wash your hair and private area, wash first with your normal shampoo/soap.  After you use shampoo/soap, rinse your hair and body thoroughly to remove shampoo/soap residue.  Turn the water OFF and apply about 3 tablespoons (45 ml) of CHG soap to a CLEAN washcloth.  Apply CHG soap ONLY FROM YOUR NECK DOWN TO YOUR TOES (washing for 3-5 minutes)  DO NOT use CHG soap on face, private areas, open wounds, or sores.  Pay special attention to the area where your surgery is being performed.  If you are having back surgery, having someone wash your back for you may be helpful. Wait 2 minutes after CHG soap is applied, then you may rinse off the CHG soap.  Pat dry with a clean towel  Put on clean clothes/pajamas   If you choose to wear lotion, please use ONLY the CHG-compatible lotions on the back of this paper.     Additional instructions for the day of surgery: DO NOT APPLY any CHG Soap,  lotions, deodorants, cologne, or perfumes on the day of surgery  Put on clean/comfortable clothes.  Brush your teeth.  Ask your nurse before applying any prescription medications to the skin.   CHG Compatible Lotions   Aveeno Moisturizing  lotion  Cetaphil Moisturizing Cream  Cetaphil Moisturizing Lotion  Clairol Herbal Essence Moisturizing Lotion, Dry Skin  Clairol Herbal Essence Moisturizing Lotion, Extra Dry Skin  Clairol Herbal Essence Moisturizing Lotion, Normal Skin  Curel Age Defying  Therapeutic Moisturizing Lotion with Alpha Hydroxy  Curel Extreme Care Body Lotion  Curel Soothing Hands Moisturizing Hand Lotion  Curel Therapeutic Moisturizing Cream, Fragrance-Free  Curel Therapeutic Moisturizing Lotion, Fragrance-Free  Curel Therapeutic Moisturizing Lotion, Original Formula  Eucerin Daily Replenishing Lotion  Eucerin Dry Skin Therapy Plus Alpha Hydroxy Crme  Eucerin Dry Skin Therapy Plus Alpha Hydroxy Lotion  Eucerin Original Crme  Eucerin Original Lotion  Eucerin Plus Crme Eucerin Plus Lotion  Eucerin TriLipid Replenishing Lotion  Keri Anti-Bacterial Hand Lotion  Keri Deep Conditioning Original Lotion Dry Skin Formula Softly Scented  Keri Deep Conditioning Original Lotion, Fragrance Free Sensitive Skin Formula  Keri Lotion Fast Absorbing Fragrance Free Sensitive Skin Formula  Keri Lotion Fast Absorbing Softly Scented Dry Skin Formula  Keri Original Lotion  Keri Skin Renewal Lotion Keri Silky Smooth Lotion  Keri Silky Smooth Sensitive Skin Lotion  Nivea Body Creamy Conditioning Oil  Nivea Body Extra Enriched Lotion  Nivea Body Original Lotion  Nivea Body Sheer Moisturizing Lotion Nivea Crme  Nivea Skin Firming Lotion  NutraDerm 30 Skin Lotion  NutraDerm Skin Lotion  NutraDerm Therapeutic Skin Cream  NutraDerm Therapeutic Skin Lotion  ProShield Protective Hand Cream  Provon moisturizing lotion   FAILURE TO FOLLOW THESE INSTRUCTIONS MAY RESULT IN THE CANCELLATION OF YOUR SURGERY  PATIENT SIGNATURE_________________________________  NURSE SIGNATURE__________________________________  ________________________________________________________________________        Nasario Exon    An  incentive spirometer is a tool that can help keep your lungs clear and active. This tool measures how well you are filling your lungs with each breath. Taking long deep breaths may help reverse or decrease the chance of developing breathing (pulmonary) problems (especially infection) following: A long period of time when you are unable to move or be active. BEFORE THE PROCEDURE  If the spirometer includes an indicator to show your best effort, your nurse or respiratory therapist will set it to a desired goal. If possible, sit up straight or lean slightly forward. Try not to slouch. Hold the incentive spirometer in an upright position. INSTRUCTIONS FOR USE  Sit on the edge of your bed if possible, or sit up as far as you can in bed or on a chair. Hold the incentive spirometer in an upright position. Breathe out normally. Place the mouthpiece in your mouth and seal your lips tightly around it. Breathe in slowly and as deeply as possible, raising the piston or the ball toward the top of the column. Hold your breath for 3-5 seconds or for as long as possible. Allow the piston or ball to fall to the bottom of the column. Remove the mouthpiece from your mouth and breathe out normally. Rest for a few seconds and repeat Steps 1 through 7 at least 10 times every 1-2 hours when you are awake. Take your time and take a few normal breaths between deep breaths. The spirometer may include an indicator to show your best effort. Use the indicator as a goal to work toward during each repetition. After each set of 10 deep breaths, practice coughing to be sure your lungs are clear. If you have an incision (the cut made at the time of surgery), support your incision when coughing by placing a pillow or rolled up towels firmly against it. Once you are able to get out of bed, walk around indoors and cough well. You may stop using the incentive spirometer when instructed by your caregiver.  RISKS AND COMPLICATIONS Take  your time so you do not get dizzy  or light-headed. If you are in pain, you may need to take or ask for pain medication before doing incentive spirometry. It is harder to take a deep breath if you are having pain. AFTER USE Rest and breathe slowly and easily. It can be helpful to keep track of a log of your progress. Your caregiver can provide you with a simple table to help with this. If you are using the spirometer at home, follow these instructions: SEEK MEDICAL CARE IF:  You are having difficultly using the spirometer. You have trouble using the spirometer as often as instructed. Your pain medication is not giving enough relief while using the spirometer. You develop fever of 100.5 F (38.1 C) or higher.                                                                                                    SEEK IMMEDIATE MEDICAL CARE IF:  You cough up bloody sputum that had not been present before. You develop fever of 102 F (38.9 C) or greater. You develop worsening pain at or near the incision site. MAKE SURE YOU:  Understand these instructions. Will watch your condition. Will get help right away if you are not doing well or get worse. Document Released: 06/26/2006 Document Revised: 05/08/2011 Document Reviewed: 08/27/2006 [facility] Patient Information 2014 [place], [place].        If you would like to see a video about joint replacement:   [email or web address]

## 2024-03-14 NOTE — Progress Notes (Signed)
 Patient's PCR screen is positive for STAPH. Appropriate notes have been placed on the patient's chart. This note has been routed to Dr Medford Poli / Bertrum Gaskins PA  for review. [name] surgery, right TKA, is currently scheduled for: 03-21-2024  at [facility].  Shawnee Aloe, BSN, CVRN-BC   Pre-Surgical Testing Nurse [facility]  [phone]

## 2024-03-15 ENCOUNTER — Encounter (HOSPITAL_COMMUNITY): Payer: Self-pay

## 2024-03-15 NOTE — Progress Notes (Signed)
 " Case: [id number] Date/Time: 03/21/24 0943   Procedure: ARTHROPLASTY, KNEE, TOTAL (Right: Knee)   Anesthesia type: Spinal   Diagnosis: Primary osteoarthritis of right knee [M17.11]   Pre-op diagnosis: osteoarthritis right knee   [facility] ROOM 09 / [facility]   Surgeons: Vernetta Lonni GRADE, MD       DISCUSSION: Bonnie Juarez is an 79 yo female with PMH of former smoking, HTN, coronary calcifications, GERD, hiatal hernia s/p repair (2022), fibromyalgia, anxiety, depression, arthritis, s/p cervical fusion C3-T2 (2018), s/p lumbar fusion L4-5 (2019).  Glidescope used for prior surgeries due to hx of cervical fusion.  Prior anesthesia complications include:  - Post-operative N/V (did better with 2014 shoulder surgery), - After her 11/09/03 right radial head replacement surgery (done under general anesthesia) she awoke of anesthesia in significant pain and felt pain never got under control in the PACU either. She did receive additional medications once roomed, but when her husband arrived she was unresponsive to sternal rub. According the hospitalist consult, she received 29 mg of morphine  sulfate and then Phenergan over a short-time course. She developed decreased LOC and hypoxia that responded to two ampules of Narcan. She required overnight pulse oximetry. (She did well with fentanyl  with her last colonoscopy.)   Patient follows with Cardiology for coronary calcifications and orthostatic hypotension. Last seen by NP Monge on 03/26/23. She denied any significant cardiac symptoms. She was advised to continue medical therapy and f/u in 6 months.  Last seen by PCP on 11/12/23 for wellness visit. Stable at that time.  Patient reports being able to go up a flight of stairs without CP/SOB. Anticipate she can proceed.  VS: BP (!) 150/74 Comment: right arm sitting  Pulse 62   Temp 36.8 C (Oral)   Resp 16   Ht 5' 6 (1.676 m)   Wt 72.6 kg   SpO2 100%   BMI 25.82 kg/m   PROVIDERS: Aisha Harvey, MD   LABS: Labs reviewed: Acceptable for surgery. (all labs ordered are listed, but only abnormal results are displayed)  Labs Reviewed  SURGICAL PCR SCREEN - Abnormal; Notable for the following components:      Result Value   Staphylococcus aureus POSITIVE (*)    All other components within normal limits  BASIC METABOLIC PANEL WITH GFR - Abnormal; Notable for the following components:   Glucose, Bld 102 (*)    GFR, Estimated 59 (*)    All other components within normal limits  CBC    Echo 04/18/22: : IMPRESSIONS    1. Left ventricular ejection fraction, by estimation, is 60 to 65%. The left ventricle has normal function. The left ventricle has no regional wall motion abnormalities. There is moderate asymmetric left ventricular hypertrophy of the basal-septal segment. Left ventricular diastolic parameters are consistent with Grade I diastolic dysfunction (impaired relaxation).  2. Right ventricular systolic function is normal. The right ventricular size is normal.  3. Left atrial size was mildly dilated.  4. Right atrial size was mildly dilated.  5. The mitral valve is normal in structure. Trivial mitral valve regurgitation. No evidence of mitral stenosis.  6. The aortic valve is tricuspid. There is mild calcification of the aortic valve. Aortic valve regurgitation is not visualized. Aortic valve sclerosis/calcification is present, without any evidence of aortic stenosis.  7. The inferior vena cava is normal in size with greater than 50% respiratory variability, suggesting right atrial pressure of 3 mmHg.  Event monitoring 04/05/22:  Patch Wear Time:  13 days and  16 hours (2024-01-09T17:37:39-0500 to 2024-01-23T09:45:27-0500)   Patient had a min HR of 46 bpm, max HR of 167 bpm, and avg HR of 64 bpm. Predominant underlying rhythm was Sinus Rhythm. 32 Supraventricular Tachycardia runs occurred, the run with the fastest interval lasting 4 beats with a max rate of 167  bpm, the  longest lasting 18 beats with an avg rate of 92 bpm. Isolated SVEs were rare (<1.0%), SVE Couplets were rare (<1.0%), and SVE Triplets were rare (<1.0%). Isolated VEs were rare (<1.0%, 335), VE Couplets were rare (<1.0%, 1), and VE Triplets were rare  (<1.0%, 1).   CT Calcium score 11/21/2017:  IMPRESSION: 1. Coronary calcium score of 80. This was 67 percentile for age and sex matched control.   2. Aorta has normal size with moderate diffuse calcifications in the aortic root and descending aorta.      Monitor shows sinus rhythm with 32 episodes of PSVT, but only up to 18 beats. No afib or pauses. Past Medical History:  Diagnosis Date   Anxiety    Arthritis    Cancer (HCC)    Hx: of squamouscell carcinoma on Left shin basal cell on face; melanoma on right lower leg   Complication of anesthesia    Dec 20, 2003 deceased LOC and hypoxia overmedicating with Morphine  and Phenergan; received Narcan X 2, overnight pulse ox   Coronary artery disease    Depression    Fibromyalgia    Frequent urination    GERD (gastroesophageal reflux disease)    Hx: of   H/O hiatal hernia    PMH: of   Headache(784.0)    Hypertension    IBS (irritable bowel syndrome)    Hx: of   Melanoma (HCC)    Right leg   Osteopenia    Hx: of per pt, pt. adds osteoporosis- DOS   Pneumonia    hx   PONV (postoperative nausea and vomiting)     Past Surgical History:  Procedure Laterality Date   APPENDECTOMY     ARCUATE KERATECTOMY     BREAST LUMPECTOMY Right 1991   CERVICAL FUSION  1983   FRACTURE SURGERY Right 2014   humerus   HERNIA REPAIR     hiatal   KNEE ARTHROSCOPY Right 2015   NICEN FUNDO PLACATION  2010   NISSEN FUNDOPLICATION N/A 04/13/2020   Procedure: EXPLORATION OF FORGUT WITH ENDOSCOPY AND HITAL HERNIA CLOSURE, UPPER ENDOSCOPY;  Surgeon: Gladis Cough, MD;  [facility];  Service: General;  Laterality: N/A;   ORIF HUMERUS FRACTURE Right 01/08/2013   Procedure: OPEN REDUCTION  INTERNAL FIXATION (ORIF) PROXIMAL HUMERUS FRACTURE;  Surgeon: Oneil JAYSON Herald, MD;  Location: [facility];  Service: Orthopedics;  Laterality: Right;  Open Reduction Internal Fixation Right Proximal Humerus Fracture   POSTERIOR CERVICAL FUSION/FORAMINOTOMY N/A 12/20/2016   Procedure: Posterior Cervical Fusion with lateral mass fixation - Cervical Three-Thoracic Two;  Surgeon: Joshua Alm RAMAN, MD;  Location: [facility] OR;  Service: Neurosurgery;  Laterality: N/A;   RADIAL HEAD REPLACEMENT  2005   trigger thrumb release Left 2001   tubular plasity  1970   XI ROBOTIC ASSISTED HIATAL HERNIA REPAIR N/A 12/06/2020   Procedure: XI ROBOTIC ASSISTED REDO HIATAL HERNIA REPAIR;  Surgeon: Gladis Cough, MD;  [facility];  Service: General;  Laterality: N/A;    MEDICATIONS:  acetaminophen  (TYLENOL ) 500 MG tablet   Ascorbic Acid (VITAMIN C PO)   buPROPion  (WELLBUTRIN  XL) 300 MG 24 hr tablet   CALCIUM PO   cholecalciferol (VITAMIN D3) 25  MCG (1000 UNIT) tablet   Coenzyme Q10 (COQ10 PO)   cromolyn (NASALCROM) 5.2 MG/ACT nasal spray   Cyanocobalamin  (VITAMIN B-12 PO)   docusate sodium  (COLACE) 100 MG capsule   doxepin (SINEQUAN) 50 MG capsule   escitalopram  (LEXAPRO ) 20 MG tablet   Evolocumab  (REPATHA  SURECLICK) 140 MG/ML SOAJ   famotidine (PEPCID) 20 MG tablet   loratadine  (CLARITIN ) 10 MG tablet   mirabegron  ER (MYRBETRIQ ) 50 MG TB24 tablet   Polyethyl Glycol-Propyl Glycol (SYSTANE) 0.4-0.3 % SOLN   RABEprazole (ACIPHEX) 20 MG tablet   No current facility-administered medications for this encounter.    Burnard CHRISTELLA Odis DEVONNA [facility] Short Stay/Anesthesiology [facility] Phone [phone] 03/15/2024 4:02 PM       "

## 2024-03-15 NOTE — Anesthesia Preprocedure Evaluation (Signed)
"                                    Anesthesia Evaluation    Airway        Dental   Pulmonary former smoker          Cardiovascular hypertension,      Neuro/Psych    GI/Hepatic   Endo/Other    Renal/GU      Musculoskeletal   Abdominal   Peds  Hematology   Anesthesia Other Findings   Reproductive/Obstetrics                              Anesthesia Physical Anesthesia Plan  ASA:   Anesthesia Plan:    Post-op Pain Management:    Induction:   PONV Risk Score and Plan:   Airway Management Planned:   Additional Equipment:   Intra-op Plan:   Post-operative Plan:   Informed Consent:   Plan Discussed with:   Anesthesia Plan Comments: (See PAT note from 1/15)         Anesthesia Quick Evaluation  "

## 2024-03-21 DIAGNOSIS — M1711 Unilateral primary osteoarthritis, right knee: Secondary | ICD-10-CM

## 2024-03-21 DIAGNOSIS — Z01818 Encounter for other preprocedural examination: Secondary | ICD-10-CM

## 2024-03-31 ENCOUNTER — Encounter (HOSPITAL_COMMUNITY): Payer: Self-pay

## 2024-04-01 ENCOUNTER — Other Ambulatory Visit: Payer: Self-pay | Admitting: Physician Assistant

## 2024-04-03 ENCOUNTER — Encounter: Admitting: Orthopaedic Surgery

## 2024-04-04 ENCOUNTER — Encounter (HOSPITAL_COMMUNITY): Admission: RE | Admit: 2024-04-04 | Source: Ambulatory Visit

## 2024-04-04 ENCOUNTER — Other Ambulatory Visit: Payer: Self-pay

## 2024-04-04 ENCOUNTER — Encounter (HOSPITAL_COMMUNITY): Payer: Self-pay

## 2024-04-04 VITALS — BP 114/72 | HR 69 | Temp 98.4°F | Resp 16 | Ht 66.0 in | Wt 158.0 lb

## 2024-04-04 DIAGNOSIS — Z01818 Encounter for other preprocedural examination: Secondary | ICD-10-CM

## 2024-04-04 HISTORY — DX: Prediabetes: R73.03

## 2024-04-04 NOTE — Progress Notes (Signed)
 No changes in med rec since completed 03-07-24 per pt.

## 2024-04-11 ENCOUNTER — Ambulatory Visit (HOSPITAL_COMMUNITY): Admission: RE | Admit: 2024-04-11 | Source: Ambulatory Visit | Admitting: Orthopaedic Surgery

## 2024-04-11 ENCOUNTER — Encounter (HOSPITAL_COMMUNITY): Payer: Self-pay | Admitting: Medical

## 2024-04-11 ENCOUNTER — Encounter (HOSPITAL_COMMUNITY): Admission: RE | Payer: Self-pay | Source: Ambulatory Visit

## 2024-04-11 DIAGNOSIS — Z01818 Encounter for other preprocedural examination: Secondary | ICD-10-CM

## 2024-04-11 DIAGNOSIS — M1711 Unilateral primary osteoarthritis, right knee: Secondary | ICD-10-CM

## 2024-04-24 ENCOUNTER — Encounter: Admitting: Orthopaedic Surgery
